# Patient Record
Sex: Male | Born: 1940 | Race: White | Hispanic: No | Smoking: Former smoker
Health system: Southern US, Community
[De-identification: ages and names within clinical notes are randomized; demographics above are authoritative.]

## PROBLEM LIST (undated history)

## (undated) DIAGNOSIS — I499 Cardiac arrhythmia, unspecified: Secondary | ICD-10-CM

## (undated) DIAGNOSIS — G8929 Other chronic pain: Secondary | ICD-10-CM

## (undated) DIAGNOSIS — R3911 Hesitancy of micturition: Secondary | ICD-10-CM

## (undated) DIAGNOSIS — Z860100 Personal history of colon polyps, unspecified: Secondary | ICD-10-CM

## (undated) DIAGNOSIS — R3915 Urgency of urination: Secondary | ICD-10-CM

## (undated) DIAGNOSIS — I10 Essential (primary) hypertension: Secondary | ICD-10-CM

## (undated) DIAGNOSIS — S058X9A Other injuries of unspecified eye and orbit, initial encounter: Secondary | ICD-10-CM

## (undated) DIAGNOSIS — G4700 Insomnia, unspecified: Secondary | ICD-10-CM

## (undated) DIAGNOSIS — M545 Low back pain, unspecified: Secondary | ICD-10-CM

## (undated) DIAGNOSIS — G2581 Restless legs syndrome: Secondary | ICD-10-CM

## (undated) DIAGNOSIS — K219 Gastro-esophageal reflux disease without esophagitis: Secondary | ICD-10-CM

## (undated) DIAGNOSIS — M1990 Unspecified osteoarthritis, unspecified site: Secondary | ICD-10-CM

## (undated) DIAGNOSIS — E785 Hyperlipidemia, unspecified: Secondary | ICD-10-CM

## (undated) DIAGNOSIS — I2510 Atherosclerotic heart disease of native coronary artery without angina pectoris: Secondary | ICD-10-CM

## (undated) DIAGNOSIS — N183 Chronic kidney disease, stage 3 (moderate): Secondary | ICD-10-CM

## (undated) DIAGNOSIS — C4490 Unspecified malignant neoplasm of skin, unspecified: Secondary | ICD-10-CM

## (undated) HISTORY — DX: Personal history of colon polyps, unspecified: Z86.0100

## (undated) HISTORY — PX: TONSILLECTOMY: SUR1361

## (undated) HISTORY — PX: BACK SURGERY: SHX140

## (undated) HISTORY — PX: FOREARM FRACTURE SURGERY: SHX649

## (undated) HISTORY — PX: COLONOSCOPY: SHX174

## (undated) HISTORY — PX: FRACTURE SURGERY: SHX138

## (undated) HISTORY — DX: Other injuries of unspecified eye and orbit, initial encounter: S05.8X9A

## (undated) HISTORY — DX: Gastro-esophageal reflux disease without esophagitis: K21.9

## (undated) HISTORY — DX: Hyperlipidemia, unspecified: E78.5

## (undated) HISTORY — DX: Personal history of colonic polyps: Z86.010

## (undated) HISTORY — DX: Cardiac arrhythmia, unspecified: I49.9

## (undated) HISTORY — PX: ORIF SHOULDER DISLOCATION W/ HUMERAL FRACTURE: SUR959

## (undated) HISTORY — PX: EYE SURGERY: SHX253

## (undated) HISTORY — PX: NASAL POLYP EXCISION: SHX2068

## (undated) HISTORY — DX: Essential (primary) hypertension: I10

## (undated) HISTORY — PX: COLONOSCOPY W/ BIOPSIES AND POLYPECTOMY: SHX1376

## (undated) HISTORY — DX: Chronic kidney disease, stage 3 (moderate): N18.3

## (undated) HISTORY — PX: DENTAL TRAUMA REPAIR (TOOTH REIMPLANTATION): SHX1450

## (undated) HISTORY — PX: PROSTATE BIOPSY: SHX241

## (undated) HISTORY — PX: WISDOM TOOTH EXTRACTION: SHX21

## (undated) HISTORY — PX: SHOULDER HARDWARE REMOVAL: SUR1131

## (undated) HISTORY — DX: Insomnia, unspecified: G47.00

## (undated) HISTORY — DX: Atherosclerotic heart disease of native coronary artery without angina pectoris: I25.10

---

## 1998-05-27 HISTORY — PX: REFRACTIVE SURGERY: SHX103

## 1999-04-23 ENCOUNTER — Ambulatory Visit (HOSPITAL_COMMUNITY): Admission: RE | Admit: 1999-04-23 | Discharge: 1999-04-23 | Payer: Self-pay | Admitting: Gastroenterology

## 1999-04-23 HISTORY — PX: PANENDOSCOPY: SHX2159

## 1999-05-23 ENCOUNTER — Ambulatory Visit (HOSPITAL_COMMUNITY): Admission: RE | Admit: 1999-05-23 | Discharge: 1999-05-23 | Payer: Self-pay | Admitting: Gastroenterology

## 2001-07-27 ENCOUNTER — Encounter: Payer: Self-pay | Admitting: Internal Medicine

## 2004-09-10 ENCOUNTER — Ambulatory Visit: Payer: Self-pay | Admitting: Internal Medicine

## 2004-09-17 ENCOUNTER — Ambulatory Visit: Payer: Self-pay | Admitting: Internal Medicine

## 2004-10-08 ENCOUNTER — Ambulatory Visit (HOSPITAL_BASED_OUTPATIENT_CLINIC_OR_DEPARTMENT_OTHER): Admission: RE | Admit: 2004-10-08 | Discharge: 2004-10-08 | Payer: Self-pay | Admitting: Internal Medicine

## 2004-10-24 ENCOUNTER — Ambulatory Visit: Payer: Self-pay | Admitting: Pulmonary Disease

## 2004-11-16 ENCOUNTER — Ambulatory Visit: Payer: Self-pay | Admitting: Internal Medicine

## 2004-11-26 ENCOUNTER — Ambulatory Visit: Payer: Self-pay | Admitting: Internal Medicine

## 2004-12-28 ENCOUNTER — Ambulatory Visit: Payer: Self-pay | Admitting: Internal Medicine

## 2005-01-14 ENCOUNTER — Ambulatory Visit: Payer: Self-pay | Admitting: Internal Medicine

## 2005-02-26 ENCOUNTER — Ambulatory Visit: Payer: Self-pay | Admitting: Internal Medicine

## 2005-03-05 ENCOUNTER — Ambulatory Visit: Payer: Self-pay | Admitting: Internal Medicine

## 2005-05-21 ENCOUNTER — Ambulatory Visit: Payer: Self-pay | Admitting: Internal Medicine

## 2005-06-06 ENCOUNTER — Ambulatory Visit: Payer: Self-pay | Admitting: Internal Medicine

## 2005-07-18 ENCOUNTER — Ambulatory Visit: Payer: Self-pay | Admitting: Internal Medicine

## 2005-08-26 ENCOUNTER — Ambulatory Visit: Payer: Self-pay | Admitting: Internal Medicine

## 2005-09-02 ENCOUNTER — Ambulatory Visit: Payer: Self-pay | Admitting: Internal Medicine

## 2006-02-05 ENCOUNTER — Ambulatory Visit: Payer: Self-pay | Admitting: Internal Medicine

## 2006-02-27 ENCOUNTER — Ambulatory Visit: Payer: Self-pay | Admitting: Internal Medicine

## 2006-03-04 ENCOUNTER — Ambulatory Visit: Payer: Self-pay | Admitting: Internal Medicine

## 2006-06-10 ENCOUNTER — Ambulatory Visit: Payer: Self-pay | Admitting: Internal Medicine

## 2006-09-04 ENCOUNTER — Ambulatory Visit: Payer: Self-pay | Admitting: Internal Medicine

## 2006-09-04 LAB — CONVERTED CEMR LAB
AST: 24 units/L (ref 0–37)
Albumin: 3.8 g/dL (ref 3.5–5.2)
Basophils Absolute: 0 10*3/uL (ref 0.0–0.1)
Bilirubin, Direct: 0.1 mg/dL (ref 0.0–0.3)
Chloride: 110 meq/L (ref 96–112)
Cholesterol: 170 mg/dL (ref 0–200)
Creatinine, Ser: 1.1 mg/dL (ref 0.4–1.5)
Eosinophils Relative: 3.6 % (ref 0.0–5.0)
Glucose, Bld: 102 mg/dL — ABNORMAL HIGH (ref 70–99)
HCT: 40.3 % (ref 39.0–52.0)
Hemoglobin: 14.5 g/dL (ref 13.0–17.0)
LDL Cholesterol: 101 mg/dL — ABNORMAL HIGH (ref 0–99)
MCHC: 35.9 g/dL (ref 30.0–36.0)
MCV: 91.2 fL (ref 78.0–100.0)
Monocytes Absolute: 0.6 10*3/uL (ref 0.2–0.7)
Neutrophils Relative %: 50.9 % (ref 43.0–77.0)
PSA: 2.92 ng/mL (ref 0.10–4.00)
Potassium: 4 meq/L (ref 3.5–5.1)
RDW: 13 % (ref 11.5–14.6)
Sodium: 146 meq/L — ABNORMAL HIGH (ref 135–145)
TSH: 2.41 microintl units/mL (ref 0.35–5.50)
Total Bilirubin: 0.9 mg/dL (ref 0.3–1.2)
Total Protein: 6.7 g/dL (ref 6.0–8.3)
WBC: 5.8 10*3/uL (ref 4.5–10.5)

## 2006-09-11 ENCOUNTER — Ambulatory Visit: Payer: Self-pay | Admitting: Internal Medicine

## 2006-11-20 DIAGNOSIS — R51 Headache: Secondary | ICD-10-CM | POA: Insufficient documentation

## 2006-11-20 DIAGNOSIS — K219 Gastro-esophageal reflux disease without esophagitis: Secondary | ICD-10-CM | POA: Insufficient documentation

## 2006-11-20 DIAGNOSIS — R519 Headache, unspecified: Secondary | ICD-10-CM | POA: Insufficient documentation

## 2006-11-20 DIAGNOSIS — E785 Hyperlipidemia, unspecified: Secondary | ICD-10-CM | POA: Insufficient documentation

## 2006-12-17 ENCOUNTER — Ambulatory Visit: Payer: Self-pay | Admitting: Internal Medicine

## 2006-12-17 DIAGNOSIS — I10 Essential (primary) hypertension: Secondary | ICD-10-CM | POA: Insufficient documentation

## 2006-12-17 DIAGNOSIS — K620 Anal polyp: Secondary | ICD-10-CM | POA: Insufficient documentation

## 2006-12-17 DIAGNOSIS — K621 Rectal polyp: Secondary | ICD-10-CM

## 2007-03-10 ENCOUNTER — Ambulatory Visit: Payer: Self-pay | Admitting: Internal Medicine

## 2007-03-10 LAB — CONVERTED CEMR LAB: VLDL: 19 mg/dL (ref 0–40)

## 2007-03-17 ENCOUNTER — Ambulatory Visit: Payer: Self-pay | Admitting: Internal Medicine

## 2007-03-17 LAB — CONVERTED CEMR LAB
Cholesterol, target level: 200 mg/dL
HDL goal, serum: 40 mg/dL
LDL Goal: 100 mg/dL

## 2007-07-28 ENCOUNTER — Telehealth: Payer: Self-pay | Admitting: Internal Medicine

## 2007-08-27 ENCOUNTER — Ambulatory Visit: Payer: Self-pay | Admitting: Internal Medicine

## 2007-08-27 ENCOUNTER — Telehealth: Payer: Self-pay | Admitting: Internal Medicine

## 2007-09-16 ENCOUNTER — Ambulatory Visit: Payer: Self-pay | Admitting: Internal Medicine

## 2007-09-16 LAB — CONVERTED CEMR LAB
ALT: 26 units/L (ref 0–53)
AST: 23 units/L (ref 0–37)
Albumin: 3.8 g/dL (ref 3.5–5.2)
BUN: 12 mg/dL (ref 6–23)
Basophils Absolute: 0 10*3/uL (ref 0.0–0.1)
Basophils Relative: 0.2 % (ref 0.0–1.0)
Bilirubin Urine: NEGATIVE
CO2: 28 meq/L (ref 19–32)
Calcium: 9 mg/dL (ref 8.4–10.5)
Chloride: 107 meq/L (ref 96–112)
Cholesterol: 153 mg/dL (ref 0–200)
Creatinine, Ser: 1.3 mg/dL (ref 0.4–1.5)
Eosinophils Absolute: 0.1 10*3/uL (ref 0.0–0.7)
Eosinophils Relative: 3 % (ref 0.0–5.0)
Glucose, Urine, Semiquant: NEGATIVE
Hemoglobin: 15 g/dL (ref 13.0–17.0)
Lymphocytes Relative: 32 % (ref 12.0–46.0)
MCHC: 34.9 g/dL (ref 30.0–36.0)
MCV: 92.6 fL (ref 78.0–100.0)
Neutro Abs: 2.7 10*3/uL (ref 1.4–7.7)
Neutrophils Relative %: 53.6 % (ref 43.0–77.0)
PSA: 3.42 ng/mL (ref 0.10–4.00)
RBC: 4.64 M/uL (ref 4.22–5.81)
Testosterone: 697.21 ng/dL (ref 350.00–890)
VLDL: 11 mg/dL (ref 0–40)
WBC Urine, dipstick: NEGATIVE
WBC: 4.9 10*3/uL (ref 4.5–10.5)
pH: 6.5

## 2007-09-23 ENCOUNTER — Ambulatory Visit: Payer: Self-pay | Admitting: Internal Medicine

## 2007-09-23 DIAGNOSIS — F528 Other sexual dysfunction not due to a substance or known physiological condition: Secondary | ICD-10-CM | POA: Insufficient documentation

## 2007-09-23 DIAGNOSIS — Z860100 Personal history of colon polyps, unspecified: Secondary | ICD-10-CM | POA: Insufficient documentation

## 2007-09-28 DIAGNOSIS — T148 Other injury of unspecified body region: Secondary | ICD-10-CM | POA: Insufficient documentation

## 2007-09-28 DIAGNOSIS — W57XXXA Bitten or stung by nonvenomous insect and other nonvenomous arthropods, initial encounter: Secondary | ICD-10-CM

## 2007-10-05 ENCOUNTER — Encounter: Payer: Self-pay | Admitting: Internal Medicine

## 2007-11-11 ENCOUNTER — Telehealth: Payer: Self-pay | Admitting: Internal Medicine

## 2008-03-30 ENCOUNTER — Ambulatory Visit: Payer: Self-pay | Admitting: Internal Medicine

## 2008-04-05 ENCOUNTER — Ambulatory Visit (HOSPITAL_BASED_OUTPATIENT_CLINIC_OR_DEPARTMENT_OTHER): Admission: RE | Admit: 2008-04-05 | Discharge: 2008-04-06 | Payer: Self-pay | Admitting: Urology

## 2008-04-05 HISTORY — PX: PENILE PROSTHESIS IMPLANT: SHX240

## 2008-06-01 ENCOUNTER — Encounter: Payer: Self-pay | Admitting: *Deleted

## 2008-09-21 ENCOUNTER — Ambulatory Visit: Payer: Self-pay | Admitting: Internal Medicine

## 2008-09-21 LAB — CONVERTED CEMR LAB
ALT: 22 units/L (ref 0–53)
AST: 20 units/L (ref 0–37)
BUN: 15 mg/dL (ref 6–23)
Basophils Relative: 0.5 % (ref 0.0–3.0)
Bilirubin, Direct: 0 mg/dL (ref 0.0–0.3)
Cholesterol: 187 mg/dL (ref 0–200)
Eosinophils Relative: 3.7 % (ref 0.0–5.0)
GFR calc non Af Amer: 58.54 mL/min (ref >60)
HCT: 44 % (ref 39.0–52.0)
LDL Cholesterol: 116 mg/dL — ABNORMAL HIGH (ref 0–99)
Lymphs Abs: 2 10*3/uL (ref 0.7–4.0)
Monocytes Relative: 10.7 % (ref 3.0–12.0)
Nitrite: NEGATIVE
Platelets: 167 10*3/uL (ref 150.0–400.0)
Potassium: 4.7 meq/L (ref 3.5–5.1)
RBC: 4.93 M/uL (ref 4.22–5.81)
Sodium: 144 meq/L (ref 135–145)
TSH: 2.09 microintl units/mL (ref 0.35–5.50)
Total Bilirubin: 1.2 mg/dL (ref 0.3–1.2)
Total CHOL/HDL Ratio: 4
Urobilinogen, UA: 0.2
VLDL: 20 mg/dL (ref 0.0–40.0)
WBC Urine, dipstick: NEGATIVE
WBC: 5.7 10*3/uL (ref 4.5–10.5)

## 2008-09-26 ENCOUNTER — Ambulatory Visit: Payer: Self-pay | Admitting: Internal Medicine

## 2008-09-26 ENCOUNTER — Telehealth: Payer: Self-pay | Admitting: Internal Medicine

## 2008-10-03 ENCOUNTER — Encounter: Payer: Self-pay | Admitting: Internal Medicine

## 2008-10-19 ENCOUNTER — Telehealth: Payer: Self-pay | Admitting: Internal Medicine

## 2009-02-23 ENCOUNTER — Telehealth: Payer: Self-pay | Admitting: Internal Medicine

## 2009-02-24 ENCOUNTER — Encounter: Payer: Self-pay | Admitting: Internal Medicine

## 2009-03-13 ENCOUNTER — Ambulatory Visit: Payer: Self-pay | Admitting: Internal Medicine

## 2009-03-13 LAB — CONVERTED CEMR LAB
HDL: 54.8 mg/dL (ref >39.00)
Total Bilirubin: 0.9 mg/dL (ref 0.3–1.2)
Total CHOL/HDL Ratio: 3
VLDL: 11.4 mg/dL (ref 0.0–40.0)

## 2009-03-20 ENCOUNTER — Ambulatory Visit: Payer: Self-pay | Admitting: Internal Medicine

## 2009-03-22 ENCOUNTER — Encounter: Payer: Self-pay | Admitting: Internal Medicine

## 2009-03-27 ENCOUNTER — Telehealth: Payer: Self-pay | Admitting: Internal Medicine

## 2009-03-30 ENCOUNTER — Ambulatory Visit: Payer: Self-pay | Admitting: Internal Medicine

## 2009-03-30 LAB — CONVERTED CEMR LAB

## 2009-04-03 ENCOUNTER — Telehealth: Payer: Self-pay | Admitting: Internal Medicine

## 2009-04-03 LAB — CONVERTED CEMR LAB
Chlamydia, DNA Probe: NEGATIVE
Hep B S Ab: POSITIVE — AB

## 2009-04-04 ENCOUNTER — Ambulatory Visit: Payer: Self-pay | Admitting: Internal Medicine

## 2009-04-04 DIAGNOSIS — B009 Herpesviral infection, unspecified: Secondary | ICD-10-CM

## 2009-05-17 ENCOUNTER — Telehealth: Payer: Self-pay | Admitting: Internal Medicine

## 2009-06-01 ENCOUNTER — Telehealth: Payer: Self-pay | Admitting: Internal Medicine

## 2009-09-27 ENCOUNTER — Ambulatory Visit: Payer: Self-pay | Admitting: Internal Medicine

## 2009-09-27 LAB — CONVERTED CEMR LAB
ALT: 27 units/L (ref 0–53)
AST: 23 units/L (ref 0–37)
Alkaline Phosphatase: 83 units/L (ref 39–117)
Basophils Relative: 0.5 % (ref 0.0–3.0)
Bilirubin, Direct: 0.1 mg/dL (ref 0.0–0.3)
Chlamydia, Swab/Urine, PCR: NEGATIVE
Chloride: 109 meq/L (ref 96–112)
Creatinine, Ser: 1.4 mg/dL (ref 0.4–1.5)
Eosinophils Relative: 4 % (ref 0.0–5.0)
HCV Ab: NEGATIVE
Hep B S Ab: POSITIVE — AB
LDL Cholesterol: 117 mg/dL — ABNORMAL HIGH (ref 0–99)
MCV: 94 fL (ref 78.0–100.0)
Monocytes Absolute: 0.6 10*3/uL (ref 0.1–1.0)
Neutrophils Relative %: 53.6 % (ref 43.0–77.0)
Nitrite: NEGATIVE
PSA: 4.7 ng/mL — ABNORMAL HIGH (ref 0.10–4.00)
Potassium: 4 meq/L (ref 3.5–5.1)
RBC: 4.72 M/uL (ref 4.22–5.81)
Total CHOL/HDL Ratio: 3
Total Protein: 6.5 g/dL (ref 6.0–8.3)
Urobilinogen, UA: 0.2
WBC: 6.1 10*3/uL (ref 4.5–10.5)

## 2009-10-02 ENCOUNTER — Ambulatory Visit: Payer: Self-pay | Admitting: Internal Medicine

## 2009-10-02 DIAGNOSIS — R972 Elevated prostate specific antigen [PSA]: Secondary | ICD-10-CM | POA: Insufficient documentation

## 2009-10-09 DIAGNOSIS — G4700 Insomnia, unspecified: Secondary | ICD-10-CM | POA: Insufficient documentation

## 2009-10-12 ENCOUNTER — Encounter: Payer: Self-pay | Admitting: Internal Medicine

## 2009-11-09 ENCOUNTER — Encounter: Payer: Self-pay | Admitting: Internal Medicine

## 2009-11-20 ENCOUNTER — Ambulatory Visit: Payer: Self-pay | Admitting: Internal Medicine

## 2009-11-24 ENCOUNTER — Encounter: Payer: Self-pay | Admitting: Internal Medicine

## 2009-12-04 ENCOUNTER — Ambulatory Visit: Payer: Self-pay | Admitting: Internal Medicine

## 2009-12-08 ENCOUNTER — Telehealth: Payer: Self-pay | Admitting: Internal Medicine

## 2010-01-09 ENCOUNTER — Telehealth: Payer: Self-pay | Admitting: Internal Medicine

## 2010-02-28 ENCOUNTER — Telehealth: Payer: Self-pay | Admitting: Internal Medicine

## 2010-03-05 ENCOUNTER — Ambulatory Visit: Payer: Self-pay | Admitting: Internal Medicine

## 2010-03-05 LAB — CONVERTED CEMR LAB
Chlamydia, Swab/Urine, PCR: NEGATIVE
PSA, Free Pct: 19 — ABNORMAL LOW (ref >25)
PSA, Free: 1.3 ng/mL
PSA: 6.81 ng/mL — ABNORMAL HIGH (ref 0.10–4.00)
Testosterone: 1014.59 ng/dL — ABNORMAL HIGH (ref 350.00–890.00)

## 2010-03-12 ENCOUNTER — Ambulatory Visit: Payer: Self-pay | Admitting: Internal Medicine

## 2010-03-12 DIAGNOSIS — E291 Testicular hypofunction: Secondary | ICD-10-CM | POA: Insufficient documentation

## 2010-04-18 ENCOUNTER — Telehealth: Payer: Self-pay | Admitting: Internal Medicine

## 2010-05-03 ENCOUNTER — Ambulatory Visit: Payer: Self-pay | Admitting: Internal Medicine

## 2010-05-14 ENCOUNTER — Ambulatory Visit: Payer: Self-pay | Admitting: Internal Medicine

## 2010-05-14 DIAGNOSIS — N411 Chronic prostatitis: Secondary | ICD-10-CM | POA: Insufficient documentation

## 2010-06-26 NOTE — Progress Notes (Signed)
Summary: lab to Dr. Logan Bores  Phone Note Call from Patient Call back at [phone]   Summary of Call: Want to make sure you sent lab of about a month ago to Dr. Marcelyn Bruins, [facility], Urology Dept.  You should have fax & info.   Initial call taken by: Rudy Jew, RN,  January 09, 2010 9:06 AM  Follow-up for Phone Call        done today Follow-up by: Willy Eddy, LPN,  January 09, 2010 9:11 AM

## 2010-06-26 NOTE — Assessment & Plan Note (Signed)
Summary: 3 month rov/njr   Vital Signs:  Patient profile:   70 year old male Height:      72 inches Weight:      218 pounds BMI:     29.67 Temp:     98.2 degrees F oral Pulse rate:   72 / minute Resp:     14 per minute BP sitting:   124 / 72  (left arm)  Vitals Entered By: Willy Eddy, LPN (March 12, 2010 10:59 AM)  Nutrition Counseling: Patient's BMI is greater than 25 and therefore counseled on weight management options. CC: roa labs, Hypertension Management Is Patient Diabetic? No   Primary Care Provider:  Stacie Glaze MD  CC:  roa labs and Hypertension Management.  History of Present Illness: folllow  up on requested STD testing ( all negative) monitering of HTN monitering of PSA  Hypertension History:      He denies headache, chest pain, palpitations, dyspnea with exertion, orthopnea, PND, peripheral edema, visual symptoms, neurologic problems, syncope, and side effects from treatment.        Positive major cardiovascular risk factors include male age 79 years old or older, hyperlipidemia, and hypertension.  Negative major cardiovascular risk factors include negative family history for ischemic heart disease and non-tobacco-user status.        Further assessment for target organ damage reveals no history of ASHD, stroke/TIA, or peripheral vascular disease.     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) 5)  Contact With or Exposure To Venereal Diseases  (ICD-V01.6) 6)  Colonic Polyps, Hx of  (ICD-V12.72) 7)  Erectile Dysfunction  (ICD-302.72) 8)  Preventive Health Care  (ICD-V70.0) 9)  Insect Bite  (ICD-919.4) 10)  Colonic  Polyps, Adenomatous, 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) 11)  Hypertension  (ICD-401.9) 12)  Hyperlipidemia  (ICD-272.4) 13)  Headache  (ICD-784.0) 14)  Gerd  (ICD-530.81)  Current Problems (verified): 1)  Insomnia  (ICD-780.52) 2)  Elevated Prostate Specific Antigen  (ICD-790.93) 3)  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 One By Mouth Daily 2)  Zocor 40 Mg  Tabs (Simvastatin) .... One By Mouth Daily 3)  Requip 1 Mg  Tabs (Ropinirole Hydrochloride) .... Take 1 Tablet By Mouth At Bedtime 4)  Diovan 80 Mg  Tabs (Valsartan) .... One By Mouth Daily 5)  Vesicare 10 Mg  Tabs (Solifenacin Succinate) .... Take 1 Tablet By Mouth Once A Day 6)  Zyrtec Allergy 10 Mg  Tabs (Cetirizine Hcl) .... Otc Prn 7)  Uroxatral 10 Mg  Tb24 (Alfuzosin Hcl) .... Once Daily 8)  Temazepam 30 Mg  Caps (Temazepam) .... One By Mouth Q Hs As Needed For Insomnia 9)  Omega-3 1000  Mg  Caps (Omega-3 Fatty Acids) .[name].. 1 Once Daily 10)  B-100 Complex  Tabs (Vitamins-Lipotropics) .[name].. 1 Once Daily 11)  Anacin 81 Mg  Tbec (Aspirin) .... Once Daily 12)  Multivitamins   Tabs (Multiple Vitamin) .... Once Daily 13)  Androgel 50 Mg/5gm  Gel (Testosterone) .... Two Pks To Skin Daily 14)  Valtrex 500 Mg Tabs (Valacyclovir Hcl) .... One By Mouth Three Times A Day  As Needed Out Break 15)  Vitamin E 400 Unit Caps (Vitamin E) .[name].. 1 Once Daily  Current Medications (verified): 1)  Protonix 40 Mg  Tbec (Pantoprazole Sodium) .... One By Mouth Daily 2)  Zocor 40 Mg  Tabs (Simvastatin) .... One By Mouth  Daily 3)  Requip 1 Mg  Tabs (Ropinirole Hydrochloride) .... Take 1 Tablet By Mouth At Bedtime 4)  Diovan 80 Mg  Tabs (Valsartan) .... One By Mouth Daily 5)  Vesicare 10 Mg  Tabs (Solifenacin Succinate) .... Take 1 Tablet By Mouth Once A Day 6)  Zyrtec Allergy 10 Mg  Tabs (Cetirizine Hcl) .... Otc Prn 7)  Uroxatral 10 Mg  Tb24 (Alfuzosin Hcl) .... Once Daily 8)  Temazepam 30 Mg  Caps (Temazepam) .... One By Mouth Q Hs As Needed For Insomnia 9)  Omega-3 1000 Mg  Caps (Omega-3 Fatty Acids) .[name].. 1 Once Daily 10)  B-100 Complex  Tabs (Vitamins-Lipotropics) .[name].. 1 Once Daily 11)  Anacin 81 Mg  Tbec (Aspirin) .... Once Daily 12)  Multivitamins   Tabs (Multiple Vitamin) .... Once Daily 13)  Androgel 50 Mg/5gm  Gel (Testosterone) .... One Pk Alternating With Two Packs Every Other Day 14)  Valtrex 500 Mg Tabs (Valacyclovir Hcl) .... One By Mouth Three Times A Day  As Needed Out Break 15)  Vitamin E 400 Unit Caps (Vitamin E) .[name].. 1 Once Daily 16)  Doxycycline Hyclate 100 Mg Caps (Doxycycline Hyclate) .... One By Mouth Two Times A Day For 30 Days  Allergies (verified): 1)  ! Pcn  Past History:  Family History: Last updated: 12/17/2006 father passed at age 45 of complications of age Pt had little contact with extended family Family History of Arthritis  Social History: Last updated: 11/20/2006 Retired Married Former Smoker Alcohol use-no Drug use-no  Risk Factors: Exercise: yes (03/17/2007)  Risk Factors: Smoking Status: quit (03/12/2010) Passive Smoke Exposure: no (03/12/2010)  Past medical, surgical, family and social histories (including risk factors) reviewed, and no changes noted (except as noted below).  Past Medical History: Reviewed history from 09/23/2007 and no changes required. GERD Headache Hyperlipidemia Insomnia MDE Hypertension corneal injury Colonic polyps, hx of  Past Surgical History: Reviewed history from 12/17/2006 and no changes  required. Panendoscopy-04/23/1999 Colonoscopy-10/03/2003 Lt are fracture Rt shoulder surgery Sinus surgery refractory surgery to eyes dental plate  Family History: Reviewed history from 12/17/2006 and no changes required. father passed at age 22 of complications of age Pt had little contact with extended family Family History of Arthritis  Social History: Reviewed history from 11/20/2006 and no changes required. Retired Married Former Smoker Alcohol use-no Drug use-no  Review of Systems  The patient denies anorexia, fever, weight loss, weight gain, vision loss, decreased hearing, hoarseness, chest pain, syncope, dyspnea on exertion, peripheral edema, prolonged cough, headaches, hemoptysis, abdominal pain, melena, hematochezia, severe indigestion/heartburn, hematuria, incontinence, genital sores, muscle weakness, suspicious skin lesions, transient blindness, difficulty walking, depression, unusual weight change, abnormal bleeding, enlarged lymph nodes, angioedema, and breast masses.    Physical Exam  General:  Well-developed,well-nourished,in no acute distress; alert,appropriate  and cooperative throughout examination Eyes:  pupils equal and pupils round.   Ears:  R ear normal and L ear normal.   Nose:  no external deformity and no nasal discharge.   Mouth:  Oral mucosa and oropharynx without lesions or exudates.  Teeth in good repair. Neck:  No deformities, masses, or tenderness noted. Lungs:  Normal respiratory effort, chest expands symmetrically. Lungs are clear to auscultation, no crackles or wheezes. Heart:  Normal rate and regular rhythm. S1 and S2 normal without gallop, murmur, click, rub or other extra sounds. Abdomen:  soft, normal bowel sounds, and no distention.   mild tendernedd Msk:  No deformity or scoliosis noted of thoracic or lumbar spine.   Extremities:  No clubbing, cyanosis, edema, or deformity noted with normal full range of motion of all joints.    Neurologic:  No cranial nerve deficits noted. Station and gait are normal. Plantar reflexes are down-going bilaterally. DTRs are symmetrical throughout. Sensory, motor and coordinative functions appear intact.   Impression & Recommendations:  Problem # 1:  TESTICULAR HYPOFUNCTION (ICD-257.2) slight elevation but pt aplied the androgel in the AM and has the test that AM  Problem # 2:  CONTACT WITH OR EXPOSURE TO VENEREAL DISEASES (ICD-V01.6) all  tests negative councilling about exposure  Problem # 3:  HYPERTENSION (ICD-401.9)  His updated medication list for this problem includes:    Diovan 80 Mg Tabs (Valsartan) ..... 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 One by mouth daily 2)  Zocor 40 Mg Tabs (Simvastatin) .... One by mouth daily 3)  Requip 1 Mg Tabs (Ropinirole hydrochloride) .... Take 1 tablet by mouth at bedtime 4)  Diovan 80 Mg Tabs (Valsartan) .... One by mouth daily 5)  Vesicare 10 Mg Tabs (Solifenacin succinate) .... Take 1 tablet by mouth once a day 6)  Zyrtec Allergy 10 Mg Tabs (Cetirizine hcl) .... Otc prn 7)  Uroxatral 10 Mg Tb24 (Alfuzosin hcl) .... Once daily 8)  Temazepam 30 Mg Caps (Temazepam) .... One by mouth q hs as needed for insomnia 9)  Omega-3 1000 Mg Caps (Omega-3 fatty acids) .[name].. 1 once daily 10)  B-100 Complex Tabs (Vitamins-lipotropics) .[name].. 1 once daily 11)  Anacin 81 Mg Tbec (Aspirin) .... Once daily 12)  Multivitamins Tabs (Multiple  vitamin) .... Once daily 13)  Androgel 50 Mg/5gm Gel (Testosterone) .... One pk alternating with two packs every other day 14)  Valtrex 500 Mg Tabs (Valacyclovir hcl) .... One by mouth three times a day  as needed out break 15)  Vitamin E 400 Unit Caps (Vitamin e) .[name].. 1 once daily 16)  Doxycycline Hyclate 100 Mg Caps (Doxycycline hyclate) .... One by mouth two times a day for 30 days  Hypertension Assessment/Plan:      The patient's hypertensive risk group is category B: At least one risk factor (excluding diabetes) with no target organ damage.  His calculated 10 year risk of coronary heart disease is  14 %.  Today's blood pressure is 124/72.  His blood pressure goal is < 140/90.  Patient Instructions: 1)  Please schedule a follow-up appointment in 2 months. 2)  PSA prior to visit, ICD-9:790.93 Prescriptions: DOXYCYCLINE HYCLATE 100 MG CAPS (DOXYCYCLINE HYCLATE) one by mouth two times a day for 30 days  #60 x 0   Entered and Authorized by:   Stacie Glaze MD   Signed by:   Stacie Glaze MD on 03/12/2010   Method used:   Electronically to        [facility]. [phone]* (retail)       [phone]       [place], [place]  [address]       Ph: [phone]       Fax: [phone]   RxID:   [id number]    Orders Added: 1)  Est. Patient Level IV [02725]   Immunization History:  Influenza Immunization History:    Influenza:  historical (01/26/2010)   Immunization History:  Influenza Immunization History:    Influenza:  Historical (01/26/2010)

## 2010-06-26 NOTE — Medication Information (Signed)
Summary: Coverage Approval for Restoril  Coverage Approval for Restoril   Imported By: Maryln Gottron 10/16/2009 12:18:14  _____________________________________________________________________  External Attachment:    Type:   Image     Comment:   External Document

## 2010-06-26 NOTE — Progress Notes (Signed)
Summary: PSA results  Phone Note Call from Patient   Caller: Patient Call For: Alex Glaze MD Summary of Call: Pt is calling for PSA results.  [phone] Initial call taken by: Lynann Beaver CMA,  December 08, 2009 8:37 AM  Follow-up for Phone Call        good new dropped from 6 to 3.9  suggesting that the bump was indeed infection Follow-up by: Alex Glaze MD,  December 08, 2009 11:06 AM  Additional Follow-up for Phone Call Additional follow up Details #1::        Pt. notified. Additional Follow-up by: Lynann Beaver CMA,  December 08, 2009 11:08 AM

## 2010-06-26 NOTE — Assessment & Plan Note (Signed)
Summary: CPX (PT WILL COME IN FOR FASTING LABS) / RS   Vital Signs:  Patient profile:   70 year old male Height:      72 inches Weight:      212 pounds BMI:     28.86 Temp:     98.3 degrees F oral Pulse rate:   72 / minute Resp:     14 per minute BP sitting:   120 / 78  (left arm)  Vitals Entered By: Willy Eddy, LPN (Oct 03, 8411 10:41 AM) CC: cpx--bcbs needs change of diovan to  generic med   CC:  cpx--bcbs needs change of diovan to  generic med.  History of Present Illness: elevations of PAS noted and treatment for possible prostate infections discussed with aln to moniter the psa and refer if the psa continues to rise the pts had screening of STD and we discussed this in length  The pt was asked about all immunizations, health maint. 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) 8)  Hypertension  (ICD-401.9) 9)  Hyperlipidemia  (ICD-272.4) 10)  Headache  (ICD-784.0) 11)  Gerd  (ICD-530.81)  Current Problems (verified): 1)  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 One By Mouth Daily 2)  Zocor 40 Mg  Tabs (Simvastatin) .... One By Mouth Daily 3)  Requip 1 Mg  Tabs (Ropinirole Hydrochloride) .... Take 1 Tablet By Mouth At Bedtime 4)  Diovan 80 Mg  Tabs (Valsartan) .... One By Mouth Daily 5)  Vesicare 10 Mg  Tabs (Solifenacin Succinate) .... Take 1 Tablet By Mouth Once A Day 6)  Zyrtec Allergy 10 Mg  Tabs (Cetirizine Hcl) .... Otc Prn 7)  Uroxatral 10 Mg  Tb24 (Alfuzosin Hcl) .... Once Daily 8)  Temazepam 30 Mg  Caps (Temazepam) .... One By Mouth Q Hs As Needed For Insomnia 9)  Omega-3 1000 Mg  Caps (Omega-3 Fatty Acids) .[name].. 1 Once Daily 10)  B-100 Complex  Tabs (Vitamins-Lipotropics) .[name].. 1 Once Daily 11)  Anacin 81 Mg  Tbec (Aspirin) .... Once Daily 12)  Multivitamins   Tabs (Multiple Vitamin) .... Once Daily 13)  Androgel 50 Mg/5gm  Gel (Testosterone) .... Two Pks To Skin Daily 14)  Valtrex 500 Mg Tabs (Valacyclovir Hcl) .... One By Mouth Three Times A Day  As Needed Out Break  Current Medications (verified): 1)  Protonix 40 Mg  Tbec (Pantoprazole Sodium) .... One By Mouth Daily 2)  Zocor 40 Mg  Tabs (Simvastatin) .... One By Mouth Daily 3)  Requip 1 Mg  Tabs (Ropinirole Hydrochloride) .... Take 1 Tablet By Mouth At Bedtime 4)  Diovan 80 Mg  Tabs (Valsartan) .... One By Mouth Daily 5)  Vesicare 10 Mg  Tabs (Solifenacin Succinate) .... Take 1 Tablet By Mouth Once A Day 6)  Zyrtec Allergy 10 Mg  Tabs (Cetirizine Hcl) .... Otc Prn 7)  Uroxatral 10 Mg  Tb24 (Alfuzosin Hcl) .... Once Daily 8)  Temazepam 30 Mg  Caps (Temazepam) .... One By Mouth Q Hs As Needed For Insomnia 9)  Omega-3 1000 Mg  Caps (Omega-3 Fatty Acids) .[name].. 1 Once Daily 10)  B-100 Complex  Tabs (Vitamins-Lipotropics) .[name].. 1 Once Daily 11)  Anacin 81 Mg  Tbec (Aspirin) .... Once Daily 12)  Multivitamins   Tabs (Multiple Vitamin) .... Once Daily 13)  Androgel 50 Mg/5gm  Gel (Testosterone) .... Two Pks To Skin  Daily 14)  Valtrex 500 Mg Tabs (Valacyclovir Hcl) .... One By Mouth Three Times A Day  As Needed Out Break 15)  Vitamin E 400 Unit Caps (Vitamin E) .[name].. 1 Once Daily 16)  Ciprofloxacin Hcl 500 Mg Tabs (Ciprofloxacin Hcl) .... One Pp Two Times A Day For 14 Days  Allergies (verified): 1)  ! Pcn  Past History:  Family History: Last updated: 12/17/2006 father passed at age 73 of complications of age Pt had little contact with extended family Family History of Arthritis  Social History: Last updated: 11/20/2006 Retired Married Former Smoker Alcohol use-no Drug use-no  Risk Factors: Exercise: yes (03/17/2007)  Risk Factors: Smoking Status: quit (10/02/2009) Passive Smoke Exposure: no (10/02/2009)  Past medical, surgical, family and social histories (including risk factors) reviewed, and no changes noted (except as noted below).  Past Medical History: Reviewed history from 09/23/2007 and no changes required. GERD Headache Hyperlipidemia Insomnia MDE Hypertension corneal injury Colonic polyps, hx of  Past Surgical History: Reviewed history from 12/17/2006 and no changes required. Panendoscopy-04/23/1999 Colonoscopy-10/03/2003 Lt are fracture Rt shoulder surgery Sinus surgery refractory surgery to eyes dental plate  Family History: Reviewed history from 12/17/2006 and no changes required. father passed at age 75 of complications of age Pt had little contact with extended family Family History of Arthritis  Social History: Reviewed history from 11/20/2006 and no changes required. Retired Married Former Smoker Alcohol use-no Drug use-no  Review of Systems  The patient denies anorexia, fever, weight loss, weight gain, vision loss, decreased hearing, hoarseness, chest pain, syncope, dyspnea on exertion, peripheral edema, prolonged cough, headaches, hemoptysis, abdominal pain, melena, hematochezia, severe indigestion/heartburn, hematuria, incontinence, genital  sores, muscle weakness, suspicious skin lesions, transient blindness, difficulty walking, depression, unusual weight change, abnormal bleeding, enlarged lymph nodes, angioedema, and breast masses.    Physical Exam  General:  Well-developed,well-nourished,in no acute distress; alert,appropriate and cooperative throughout examination Head:  normocephalic and male-pattern balding.   Eyes:  pupils equal and pupils round.   Ears:  R ear normal and L ear normal.   Nose:  no external deformity and no nasal discharge.   Neck:  No deformities, masses, or tenderness noted. Chest Wall:  No deformities, masses, tenderness or gynecomastia noted. Lungs:  Normal respiratory effort, chest expands symmetrically. Lungs are clear to auscultation, no crackles or wheezes. Heart:  Normal rate and regular rhythm. S1 and S2 normal without gallop, murmur, click, rub or other extra sounds. Abdomen:  soft, normal bowel sounds, and no distention.   mild tendernedd Msk:  No deformity or scoliosis noted of thoracic  or lumbar spine.   Pulses:  R and L carotid,radial,femoral,dorsalis pedis and posterior tibial pulses are full and equal bilaterally Extremities:  No clubbing, cyanosis, edema, or deformity noted with normal full range of motion of all joints.   Neurologic:  No cranial nerve deficits noted. Station and gait are normal. Plantar reflexes are down-going bilaterally. DTRs are symmetrical throughout. Sensory, motor and coordinative functions appear intact.   Impression & Recommendations:  Problem # 1:  PREVENTIVE HEALTH CARE (ICD-V70.0) The pt was asked about all immunizations, health maint. services that are appropriate to their age and was given guidance on diet exercize  and weight management  Colonoscopy: abnormal (10/08/2006) Td Booster: Historical (05/27/2006)   Flu Vax: Historical (03/30/2009)   Pneumovax: Historical (05/27/2006) Chol: 189 (09/27/2009)   HDL: 58.50 (09/27/2009)   LDL: 117 (09/27/2009)    TG: 69.0 (09/27/2009) TSH: 3.01 (09/27/2009)   PSA: 4.70 (09/27/2009) Next Colonoscopy due:: 09/2009 (09/26/2008)  Discussed using sunscreen, use of alcohol, drug use, self testicular exam, routine dental care, routine eye care, routine physical exam, seat belts, multiple vitamins, osteoporosis prevention, adequate calcium intake in diet, and recommendations for immunizations.  Discussed exercise and checking cholesterol.  Discussed gun safety, safe sex, and contraception. Also recommend checking PSA.  Problem # 2:  HYPERTENSION (ICD-401.9) stable His updated medication list for this problem includes:    Diovan 80 Mg Tabs (Valsartan) ..... One by mouth daily  BP today: 120/78 Prior BP: 130/76 (04/04/2009)  Prior 10 Yr Risk Heart Disease: 22 % (03/17/2007)  Labs Reviewed: K+: 4.0 (09/27/2009) Creat: : 1.4 (09/27/2009)   Chol: 189 (09/27/2009)   HDL: 58.50 (09/27/2009)   LDL: 117 (09/27/2009)   TG: 69.0 (09/27/2009)  Problem # 3:  GERD (ICD-530.81)  His updated medication list for this problem includes:    Protonix 40 Mg Tbec (Pantoprazole sodium) ..... One by mouth daily  Complete Medication List: 1)  Protonix 40 Mg Tbec (Pantoprazole sodium) .... One by mouth daily 2)  Zocor 40 Mg Tabs (Simvastatin) .... One by mouth daily 3)  Requip 1 Mg Tabs (Ropinirole hydrochloride) .... Take 1 tablet by mouth at bedtime 4)  Diovan 80 Mg Tabs (Valsartan) .... One by mouth daily 5)  Vesicare 10 Mg Tabs (Solifenacin succinate) .... Take 1 tablet by mouth once a day 6)  Zyrtec Allergy 10 Mg Tabs (Cetirizine hcl) .... Otc prn 7)  Uroxatral 10 Mg Tb24 (Alfuzosin hcl) .... Once daily 8)  Temazepam 30 Mg Caps (Temazepam) .... One by mouth q hs as needed for insomnia 9)  Omega-3 1000 Mg Caps (Omega-3 fatty acids) .[name].. 1 once daily 10)  B-100 Complex Tabs (Vitamins-lipotropics) .[name].. 1 once daily 11)  Anacin 81 Mg Tbec (Aspirin) .... Once daily 12)  Multivitamins Tabs (Multiple vitamin) .... Once  daily 13)  Androgel 50 Mg/5gm Gel (Testosterone) .... Two pks to skin daily 14)  Valtrex 500 Mg Tabs (Valacyclovir hcl) .... One by mouth three times a day  as needed out break 15)  Vitamin E 400 Unit Caps (Vitamin e) .[name].. 1 once daily 16)  Ciprofloxacin Hcl 500 Mg Tabs (Ciprofloxacin hcl) .... One pp two times a day for 14 days  Other Orders: Prescription Created Electronically [id number])  Patient Instructions: 1)  pt will call and set up the colon for hx of polyps ( recall date was 09/24/2009) 2)  Please schedule a follow-up appointment in 8 weeks. 3)  PSA prior to visit, ICD-9:601.1 Prescriptions: CIPROFLOXACIN HCL 500 MG TABS (CIPROFLOXACIN HCL) one  pp two times a day for 14 days  #28 x 0   Entered and Authorized by:   Stacie Glaze MD   Signed by:   Stacie Glaze MD on 10/02/2009   Method used:   Print then Give to Patient   RxID:   [id number] TEMAZEPAM 30 MG  CAPS (TEMAZEPAM) one by mouth q hs as needed for insomnia  #90 x 3   Entered and Authorized by:   Stacie Glaze MD   Signed by:   Stacie Glaze MD on 10/02/2009   Method used:   Print then Give to Patient   RxID:   [id number]    Immunization History:  Influenza Immunization History:    Influenza:  historical (03/30/2009)    Appended Document: CPX (PT WILL COME IN FOR FASTING LABS) / RS

## 2010-06-26 NOTE — Procedures (Signed)
Summary: Colonoscopy Report/[facility]  Colonoscopy Report/[facility]   Imported By: Maryln Gottron 11/14/2009 13:48:19  _____________________________________________________________________  External Attachment:    Type:   Image     Comment:   External Document

## 2010-06-26 NOTE — Progress Notes (Signed)
Summary: add labs to appt   Phone Note Call from Patient   Caller: Patient Call For: Stacie Glaze MD Summary of Call: Pt wants to add STD testing on his next lab appt. next week.  Also, wants Testosterone repeated. [phone] Initial call taken by: Lynann Beaver CMA,  February 28, 2010 9:39 AM  Follow-up for Phone Call        ok to add to l abs--same dx Follow-up by: Willy Eddy, LPN,  February 28, 2010 9:42 AM  Additional Follow-up for Phone Call Additional follow up Details #1::        labs  must be specify Additional Follow-up by: Heron Sabins,  February 28, 2010 9:50 AM    Additional Follow-up for Phone Call Additional follow up Details #2::    thanks for reminding me- he needs rpr, gc chlamidia urine, hiv and testosterone level Follow-up by: Willy Eddy, LPN,  February 28, 2010 10:00 AM  Additional Follow-up for Phone Call Additional follow up Details #3:: Details for Additional Follow-up Action Taken: lmom/ [facility] [phone] 03-01-10 pt is aware 03-02-2010 Additional Follow-up by: Heron Sabins,  February 28, 2010 11:01 AM

## 2010-06-26 NOTE — Letter (Signed)
Summary: [facility] Urology-Upcoming Appointment   [facility] Urology-Upcoming Appointment   Imported By: Maryln Gottron 11/24/2009 10:51:41  _____________________________________________________________________  External Attachment:    Type:   Image     Comment:   External Document

## 2010-06-26 NOTE — Progress Notes (Signed)
Summary: REFILL REQUEST  Phone Note Refill Request Message from:  Patient on April 18, 2010 10:09 AM  Refills Requested: Medication #1:  VESICARE 10 MG  TABS Take 1 tablet by mouth once a day   Notes: [facility]  Medication #2:  TEMAZEPAM 30 MG  CAPS one by mouth q hs as needed for insomnia   Notes: [facility]    Initial call taken by: Debbra Riding,  April 18, 2010 10:10 AM Caller: Patient   [phone]    Prescriptions: TEMAZEPAM 30 MG  CAPS (TEMAZEPAM) one by mouth q hs as needed for insomnia  #90 x 3   Entered by:   Willy Eddy, LPN   Authorized by:   Stacie Glaze MD   Signed by:   Willy Eddy, LPN on 28/41/3244   Method used:   Telephoned to ...       [facility]. [phone]* (retail)       [phone]       [place], [place]  [address]       Ph: [phone]       Fax: [phone]   RxID:   [id number] VESICARE 10 MG  TABS (SOLIFENACIN SUCCINATE) Take 1 tablet by mouth once a day  #90 x 3   Entered by:   Willy Eddy, LPN   Authorized by:   Stacie Glaze MD   Signed by:   Willy Eddy, LPN on 32/35/5732   Method used:   Telephoned to ...       [facility]. [phone]* (retail)       [phone] [address]       [place], [place]  [address]       Ph: [phone]       Fax: [phone]   RxID:   [id number]

## 2010-06-26 NOTE — Progress Notes (Signed)
Summary: REQ FOR REFILL  Phone Note Call from Patient Message from:  Patient on June 01, 2009 8:41 AM  Refills Requested: Medication #1:  Androgel 50mg /5gm gel   Dosage confirmed as above?Dosage Confirmed   Supply Requested: 3 months   Dosage confirmed as above?Dosage Confirmed  Method Requested: Electronic to [facility] Caller: Patient [phone] or [phone] Reason for Call: Refill Medication Summary of Call: Pt called to adv that he needs to have a refill RX for med (Androgel 50mg /5gm gel)..... Pt req that same be sent in to [facility]...Marland KitchenMarland KitchenPt req a call when same has been done, msg can be left on v/m @   [phone]  or  [phone].   Initial call taken by: Debbra Riding,  June 01, 2009 8:38 AM  Follow-up for Phone Call        done Follow-up by: Willy Eddy, LPN,  June 01, 2009 8:48 AM  Additional Follow-up for Phone Call Additional follow up Details #1::        Phone Call Completed---------Pt contacted and adv that RX has been sent to [facility]... Pt acknowledged same.  Additional Follow-up by: Debbra Riding,  June 01, 2009 10:07 AM    Prescriptions: ANDROGEL 50 MG/5GM  GEL (TESTOSTERONE) two pks to skin daily  #180 x 1   Entered by:   Willy Eddy, LPN   Authorized by:   Stacie Glaze MD   Signed by:   Willy Eddy, LPN on 29/52/8413   Method used:   Print then Give to Patient   RxID:   [id number]

## 2010-06-26 NOTE — Assessment & Plan Note (Signed)
Summary: 2 month rov/njr   Vital Signs:  Patient profile:   70 year old male Height:      72 inches Weight:      214 pounds BMI:     29.13 Temp:     98.2 degrees F oral Pulse rate:   72 / minute Resp:     14 per minute BP sitting:   140 / 80  (left arm)  Vitals Entered By: Willy Eddy, LPN (December 04, 2009 11:03 AM) CC: roa-labs- dr Logan Bores received psa and wants your opinion as to what to do?, Hypertension Management, Lipid Management   Primary Care Provider:  Stacie Glaze MD  CC:  roa-labs- dr Logan Bores received psa and wants your opinion as to what to do?, Hypertension Management, and Lipid Management.  History of Present Illness: The pt was seen in [facility]rology ( [facility]) the pts PSA continues to rise this could be due to the testosterone the urologist has suggested a PSA 2 and send to the urologist   Hypertension History:      He denies headache, chest pain, palpitations, dyspnea with exertion, orthopnea, PND, peripheral edema, visual symptoms, neurologic problems, syncope, and side effects from treatment.        Positive major cardiovascular risk factors include male age 64 years old or older, hyperlipidemia, and hypertension.  Negative major cardiovascular risk factors include negative family history for ischemic heart disease and non-tobacco-user status.        Further assessment for target organ damage reveals no history of ASHD, stroke/TIA, or peripheral vascular disease.    Lipid Management History:      Positive NCEP/ATP III risk factors include male age 59 years old or older and hypertension.  Negative NCEP/ATP III risk factors include no family history for ischemic heart disease, non-tobacco-user status, no ASHD (atherosclerotic heart disease), no prior stroke/TIA, no peripheral vascular disease, and no history of aortic aneurysm.      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) 10)  Hypertension  (ICD-401.9) 11)  Hyperlipidemia  (ICD-272.4) 12)  Headache  (ICD-784.0) 13)  Gerd  (ICD-530.81)  Current Problems (verified): 1)  Insomnia  (ICD-780.52) 2)  Elevated Prostate Specific Antigen  (ICD-790.93) 3)  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 One By Mouth Daily 2)  Zocor 40 Mg  Tabs (Simvastatin) .... One By Mouth Daily 3)  Requip 1 Mg  Tabs (Ropinirole Hydrochloride) .... Take 1 Tablet By Mouth At Bedtime 4)  Diovan 80 Mg  Tabs (Valsartan) .Marland KitchenMarland KitchenMarland Kitchen  One By Mouth Daily 5)  Vesicare 10 Mg  Tabs (Solifenacin Succinate) .... Take 1 Tablet By Mouth Once A Day 6)  Zyrtec Allergy 10 Mg  Tabs (Cetirizine Hcl) .... Otc Prn 7)  Uroxatral 10 Mg  Tb24 (Alfuzosin Hcl) .... Once Daily 8)  Temazepam 30 Mg  Caps (Temazepam) .... One By Mouth Q Hs As Needed For Insomnia 9)  Omega-3 1000 Mg  Caps (Omega-3 Fatty Acids) .[name].. 1 Once Daily 10)  B-100 Complex  Tabs (Vitamins-Lipotropics) .[name].. 1 Once Daily 11)  Anacin 81 Mg  Tbec (Aspirin) .... Once Daily 12)  Multivitamins   Tabs (Multiple Vitamin) .... Once Daily 13)   Androgel 50 Mg/5gm  Gel (Testosterone) .... Two Pks To Skin Daily 14)  Valtrex 500 Mg Tabs (Valacyclovir Hcl) .... One By Mouth Three Times A Day  As Needed Out Break 15)  Vitamin E 400 Unit Caps (Vitamin E) .[name].. 1 Once Daily 16)  Ciprofloxacin Hcl 500 Mg Tabs (Ciprofloxacin Hcl) .... One Pp Two Times A Day For 14 Days  Current Medications (verified): 1)  Protonix 40 Mg  Tbec (Pantoprazole Sodium) .... One By Mouth Daily 2)  Zocor 40 Mg  Tabs (Simvastatin) .... One By Mouth Daily 3)  Requip 1 Mg  Tabs (Ropinirole Hydrochloride) .... Take 1 Tablet By Mouth At Bedtime 4)  Diovan 80 Mg  Tabs (Valsartan) .... One By Mouth Daily 5)  Vesicare 10 Mg  Tabs (Solifenacin Succinate) .... Take 1 Tablet By Mouth Once A Day 6)  Zyrtec Allergy 10 Mg  Tabs (Cetirizine Hcl) .... Otc Prn 7)  Uroxatral 10 Mg  Tb24 (Alfuzosin Hcl) .... Once Daily 8)  Temazepam 30 Mg  Caps (Temazepam) .... One By Mouth Q Hs As Needed For Insomnia 9)  Omega-3 1000 Mg  Caps (Omega-3 Fatty Acids) .[name].. 1 Once Daily 10)  B-100 Complex  Tabs (Vitamins-Lipotropics) .[name].. 1 Once Daily 11)  Anacin 81 Mg  Tbec (Aspirin) .... Once Daily 12)  Multivitamins   Tabs (Multiple Vitamin) .... Once Daily 13)  Androgel 50 Mg/5gm  Gel (Testosterone) .... Two Pks To Skin Daily 14)  Valtrex 500 Mg Tabs (Valacyclovir Hcl) .... One By Mouth Three Times A Day  As Needed Out Break 15)  Vitamin E 400 Unit Caps (Vitamin E) .[name].. 1 Once Daily  Allergies (verified): 1)  ! Pcn  Past History:  Family History: Last updated: 12/17/2006 father passed at age 41 of complications of age Pt had little contact with extended family Family History of Arthritis  Social History: Last updated: 11/20/2006 Retired Married Former Smoker Alcohol use-no Drug use-no  Risk Factors: Exercise: yes (03/17/2007)  Risk Factors: Smoking Status: quit (12/04/2009) Passive Smoke Exposure: no (12/04/2009)  Past medical, surgical, family and social histories (including  risk factors) reviewed, and no changes noted (except as noted below).  Past Medical History: Reviewed history from 09/23/2007 and no changes required. GERD Headache Hyperlipidemia Insomnia MDE Hypertension corneal injury Colonic polyps, hx of  Past Surgical History: Reviewed history from 12/17/2006 and no changes required. Panendoscopy-04/23/1999 Colonoscopy-10/03/2003 Lt are fracture Rt shoulder surgery Sinus surgery refractory surgery to eyes dental plate  Family History: Reviewed history from 12/17/2006 and no changes required. father passed at age 59 of complications of age Pt had little contact with extended family Family History of Arthritis  Social History: Reviewed history from 11/20/2006 and no changes required. Retired Married Former Smoker Alcohol use-no Drug use-no  Review of Systems  The patient denies anorexia, fever, weight loss,  weight gain, vision loss, decreased hearing, hoarseness, chest pain, syncope, dyspnea on exertion, peripheral edema, prolonged cough, headaches, hemoptysis, abdominal pain, melena, hematochezia, severe indigestion/heartburn, hematuria, incontinence, genital sores, muscle weakness, suspicious skin lesions, transient blindness, difficulty walking, depression, unusual weight change, abnormal bleeding, enlarged lymph nodes, angioedema, and breast masses.    Physical Exam  General:  Well-developed,well-nourished,in no acute distress; alert,appropriate and cooperative throughout examination Head:  normocephalic and male-pattern balding.   Eyes:  pupils equal and pupils round.   Ears:  R ear normal and L ear normal.   Nose:  no external deformity and no nasal discharge.   Mouth:  Oral mucosa and oropharynx without lesions or exudates.  Teeth in good repair. Neck:  No deformities, masses, or tenderness noted. Lungs:  Normal respiratory effort, chest expands symmetrically. Lungs are clear to auscultation, no crackles or wheezes. Heart:   Normal rate and regular rhythm. S1 and S2 normal without gallop, murmur, click, rub or other extra sounds.   Impression & Recommendations:  Problem # 1:  ELEVATED PROSTATE SPECIFIC ANTIGEN (ICD-790.93) Assessment Deteriorated  trhe psa has persistantly risen to 6 need free psa and if less than 25% WOULD DISCUSS BIOPSY  Orders: Venipuncture (04540) T-PSA Free ([address]) T-PSA Total ([address])  Problem # 2:  ERECTILE DYSFUNCTION (ICD-302.72) Assessment: Unchanged  Discussed proper use of medications, as well as side effects.   Problem # 3:  HYPERTENSION (ICD-401.9)  His updated medication list for this problem includes:    Diovan 80 Mg Tabs (Valsartan) ..... One by mouth daily  BP today: 140/80 Prior BP: 120/78 (10/02/2009)  Prior 10 Yr Risk Heart Disease: 22 % (03/17/2007)  Labs Reviewed: K+: 4.0 (09/27/2009) Creat: : 1.4 (09/27/2009)   Chol: 189 (09/27/2009)   HDL: 58.50 (09/27/2009)   LDL: 117 (09/27/2009)   TG: 69.0 (09/27/2009)  Problem # 4:  GERD (ICD-530.81)  His updated medication list for this problem includes:    Protonix 40 Mg Tbec (Pantoprazole sodium) ..... One by mouth daily  Labs Reviewed: Hgb: 15.6 (09/27/2009)   Hct: 44.3 (09/27/2009)  Complete Medication List: 1)  Protonix 40 Mg Tbec (Pantoprazole sodium) .... One by mouth daily 2)  Zocor 40 Mg Tabs (Simvastatin) .... One by mouth daily 3)  Requip 1 Mg Tabs (Ropinirole hydrochloride) .... Take 1 tablet by mouth at bedtime 4)  Diovan 80 Mg Tabs (Valsartan) .... One by mouth daily 5)  Vesicare 10 Mg Tabs (Solifenacin succinate) .... Take 1 tablet by mouth once a day 6)  Zyrtec Allergy 10 Mg Tabs (Cetirizine hcl) .... Otc prn 7)  Uroxatral 10 Mg Tb24 (Alfuzosin hcl) .... Once daily 8)  Temazepam 30 Mg Caps (Temazepam) .... One by mouth q hs as needed for insomnia 9)  Omega-3 1000 Mg Caps (Omega-3 fatty acids) .[name].. 1 once daily 10)  B-100 Complex Tabs (Vitamins-lipotropics) .[name].. 1 once daily 11)   Anacin 81 Mg Tbec (Aspirin) .... Once daily 12)  Multivitamins Tabs (Multiple vitamin) .... Once daily 13)  Androgel 50 Mg/5gm Gel (Testosterone) .... Two pks to skin daily 14)  Valtrex 500 Mg Tabs (Valacyclovir hcl) .... One by mouth three times a day  as needed out break 15)  Vitamin E 400 Unit Caps (Vitamin e) .[name].. 1 once daily  Hypertension Assessment/Plan:      The patient's hypertensive risk group is category B: At least one risk factor (excluding diabetes) with no target organ damage.  His calculated 10 year risk of coronary heart disease is 22 %.  Today's blood pressure is 140/80.  His blood pressure goal is < 140/90.  Lipid Assessment/Plan:      Based on NCEP/ATP III, the patient's risk factor category is "2 or more risk factors and a calculated 10 year CAD risk of > 20%".  The patient's lipid goals are as follows: Total cholesterol goal is 200; LDL cholesterol goal is 100; HDL cholesterol goal is 40; Triglyceride goal is 150.  His LDL cholesterol goal has been met.    Patient Instructions: 1)  Please schedule a follow-up appointment in 3 months. 2)  FREE AND TOTAL PSA IN 3 MONTHS   Prescriptions: ANDROGEL 50 MG/5GM  GEL (TESTOSTERONE) two pks to skin daily  #180 x 1   Entered by:   Willy Eddy, LPN   Authorized by:   Stacie Glaze MD   Signed by:   Willy Eddy, LPN on 04/54/0981   Method used:   Print then Give to Patient   RxID:   [id number] PROTONIX 40 MG  TBEC (PANTOPRAZOLE SODIUM) one by mouth daily  #90 x 3   Entered by:   Willy Eddy, LPN   Authorized by:   Stacie Glaze MD   Signed by:   Willy Eddy, LPN on 57/84/6962   Method used:   Print then Give to Patient   RxID:   [id number]

## 2010-06-28 NOTE — Assessment & Plan Note (Signed)
Summary: 2 MO ROV/MM   Vital Signs:  Patient profile:   70 year old male Height:      72 inches Weight:      218 pounds BMI:     29.67 Temp:     98.2 degrees F oral Pulse rate:   72 / minute Resp:     14 per minute BP sitting:   144 / 80  (left arm)  Vitals Entered By: Willy Eddy, LPN (May 14, 2010 11:52 AM) CC: roa Is Patient Diabetic? No   Primary Care Provider:  Stacie Glaze MD  CC:  roa.  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) 5)  Contact With or Exposure To Venereal Diseases  (ICD-V01.6) 6)  Colonic Polyps, Hx of  (ICD-V12.72) 7)  Erectile Dysfunction  (ICD-302.72) 8)  Preventive Health Care  (ICD-V70.0) 9)  Insect Bite  (ICD-919.4) 10)  Colonic Polyps, Adenomatous, 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) 11)  Hypertension  (ICD-401.9) 12)  Hyperlipidemia  (ICD-272.4) 13)  Headache  (ICD-784.0) 14)  Gerd  (ICD-530.81)  Current Problems (verified): 1)  Testicular Hypofunction  (ICD-257.2) 2)  Insomnia  (ICD-780.52) 3)  Elevated Prostate Specific Antigen  (ICD-790.93) 4)  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 One By Mouth Daily 2)  Zocor 40 Mg  Tabs (Simvastatin) .... One By Mouth Daily 3)  Requip 1 Mg  Tabs (Ropinirole Hydrochloride) .... Take 1 Tablet By Mouth At Bedtime 4)  Diovan 80 Mg  Tabs (Valsartan) .... One By Mouth Daily 5)  Vesicare 10 Mg  Tabs (Solifenacin Succinate) .... Take 1 Tablet By Mouth Once A Day 6)  Zyrtec Allergy 10 Mg  Tabs (Cetirizine Hcl) .... Otc Prn 7)  Uroxatral 10 Mg  Tb24 (Alfuzosin Hcl) .... Once Daily 8)  Temazepam 30 Mg  Caps (Temazepam) .... One By Mouth Q Hs As Needed For Insomnia 9)  Omega-3 1000 Mg  Caps (Omega-3 Fatty Acids) .[name].. 1 Once Daily 10)  B-100 Complex  Tabs (Vitamins-Lipotropics) .[name].. 1 Once Daily 11)  Anacin 81 Mg  Tbec (Aspirin) .... Once Daily 12)  Multivitamins   Tabs (Multiple Vitamin) .... Once Daily 13)  Androgel 50 Mg/5gm  Gel (Testosterone) .... One Pk Alternating With Two Packs Every Other Day 14)  Valtrex 500 Mg Tabs (Valacyclovir Hcl) .... One By Mouth Three Times A Day  As Needed Out Break 15)  Vitamin E 400 Unit Caps (Vitamin E) .[name].. 1 Once Daily 16)  Doxycycline Hyclate 100 Mg Caps (Doxycycline Hyclate) .... One By Mouth Two Times A Day For 30 Days  Current Medications (verified): 1)  Protonix 40 Mg  Tbec (Pantoprazole Sodium) .... One By Mouth Daily 2)  Zocor 40 Mg  Tabs (Simvastatin) .... One By Mouth Daily 3)  Requip 1 Mg  Tabs (Ropinirole Hydrochloride) .... Take 1 Tablet By Mouth At Bedtime 4)  Diovan 80 Mg  Tabs (Valsartan) .... One By Mouth Daily 5)  Vesicare 10 Mg  Tabs (Solifenacin Succinate) .... Take 1 Tablet By Mouth Once A Day 6)  Zyrtec Allergy 10 Mg  Tabs (Cetirizine Hcl) .... Otc Prn 7)  Uroxatral 10 Mg  Tb24 (Alfuzosin Hcl) .... Once Daily 8)  Temazepam 30 Mg  Caps (Temazepam) .... One By Mouth Q Hs As Needed For Insomnia 9)  Omega-3 1000 Mg  Caps (Omega-3 Fatty Acids)  .[name].. 1 Once Daily 10)  B-100 Complex  Tabs (Vitamins-Lipotropics) .[name].. 1 Once Daily 11)  Anacin 81 Mg  Tbec (Aspirin) .... Once Daily 12)  Multivitamins   Tabs (Multiple Vitamin) .... Once Daily 13)  Androgel 50 Mg/5gm  Gel (Testosterone) .... One Pk Alternating With Two Packs Every Other Day 14)  Valtrex 500 Mg Tabs (Valacyclovir Hcl) .... One By Mouth Three Times A Day  As Needed Out Break 15)  Vitamin E 400 Unit Caps (Vitamin E) .[name].. 1 Once Daily 16)  Ciprofloxacin Hcl 250 Mg Tabs (Ciprofloxacin Hcl) .... One By Mouth Two Times A Day  Allergies (verified): 1)  ! Pcn  Past History:  Family History: Last updated: 12/17/2006 father passed at age 78 of complications of age Pt had little contact with extended family Family History of Arthritis  Social History: Last updated: 11/20/2006 Retired Married Former Smoker Alcohol use-no Drug use-no  Risk Factors: Exercise: yes (03/17/2007)  Risk Factors: Smoking Status: quit (05/14/2010) Passive Smoke Exposure: no (05/14/2010)  Past medical, surgical, family and social histories (including risk factors) reviewed, and no changes noted (except as noted below).  Past Medical History: Reviewed history from 09/23/2007 and no changes required. GERD Headache Hyperlipidemia Insomnia MDE Hypertension corneal injury Colonic polyps, hx of  Past Surgical History: Reviewed history from 12/17/2006 and no changes required. Panendoscopy-04/23/1999 Colonoscopy-10/03/2003 Lt are fracture Rt shoulder surgery Sinus surgery refractory surgery to eyes dental plate  Family History: Reviewed history from 12/17/2006 and no changes required. father passed at age 34 of complications of age Pt had little contact with extended family Family History of Arthritis  Social History: Reviewed history from 11/20/2006 and no changes required. Retired Married Former Smoker Alcohol use-no Drug use-no  Review of Systems  The patient denies  anorexia, fever, weight loss, weight gain, vision loss, decreased hearing, hoarseness, chest pain, syncope, dyspnea on exertion, peripheral edema, prolonged cough, headaches, hemoptysis, abdominal pain, melena, hematochezia, severe indigestion/heartburn, hematuria, incontinence, genital sores, muscle weakness, suspicious skin lesions, transient blindness, difficulty walking, depression, unusual weight change, abnormal bleeding, enlarged lymph nodes, angioedema, breast masses, and testicular masses.    Physical Exam  General:  Well-developed,well-nourished,in no acute distress; alert,appropriate and cooperative throughout examination Head:  normocephalic and male-pattern balding.   Eyes:  pupils equal and pupils round.   Ears:  R ear normal and L ear normal.   Nose:  no external deformity and no nasal discharge.   Neck:  No deformities, masses, or tenderness noted. Lungs:  Normal respiratory effort, chest expands symmetrically. Lungs are clear to auscultation, no crackles or wheezes. Heart:  Normal rate and regular  rhythm. S1 and S2 normal without gallop, murmur, click, rub or other extra sounds.   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 One by mouth daily 2)  Zocor 40 Mg Tabs (Simvastatin) .... One by mouth daily 3)  Requip 1 Mg Tabs (Ropinirole hydrochloride) .... Take 1 tablet by mouth at bedtime 4)  Diovan 80 Mg Tabs (Valsartan) .... One by mouth daily 5)  Vesicare  10 Mg Tabs (Solifenacin succinate) .... Take 1 tablet by mouth once a day 6)  Zyrtec Allergy 10 Mg Tabs (Cetirizine hcl) .... Otc prn 7)  Uroxatral 10 Mg Tb24 (Alfuzosin hcl) .... Once daily 8)  Temazepam 30 Mg Caps (Temazepam) .... One by mouth q hs as needed for insomnia 9)  Omega-3 1000 Mg Caps (Omega-3 fatty acids) .[name].. 1 once daily 10)  B-100 Complex Tabs (Vitamins-lipotropics) .[name].. 1 once daily 11)  Anacin 81 Mg Tbec (Aspirin) .... Once daily 12)  Multivitamins Tabs (Multiple vitamin) .... Once daily 13)  Androgel 50 Mg/5gm Gel (Testosterone) .... One pk alternating with two packs every other day 14)  Valtrex 500 Mg Tabs (Valacyclovir hcl) .... One by mouth three times a day  as needed out break 15)  Vitamin E 400 Unit Caps (Vitamin e) .[name].. 1 once daily 16)  Ciprofloxacin Hcl 250 Mg Tabs (Ciprofloxacin hcl) .... One by mouth two times a day  Patient Instructions: 1)  Please schedule a follow-up appointment in 3 months. 2)  PSA prior to visit, ICD-9:601.9 3)  TESTOSETERONE 607.84 4)  rpr, URINE FOR CHLAMIDIA AND GC AND HIV 5)  v01.6 Prescriptions: CIPROFLOXACIN HCL 250 MG TABS (CIPROFLOXACIN HCL) ONE by mouth two times a day  #60 x 1   Entered and Authorized by:   Stacie Glaze MD   Signed by:   Stacie Glaze MD on 05/14/2010   Method used:   Electronically to        [facility]. [phone]* (retail)       [phone]       [place], [place]  [address]       Ph: [phone]       Fax: [phone]   RxID:   [id number]    Orders Added: 1)  Est. Patient Level IV [02725]

## 2010-07-31 ENCOUNTER — Telehealth: Payer: Self-pay | Admitting: Internal Medicine

## 2010-07-31 NOTE — Telephone Encounter (Signed)
Pt needs refills on meds: androgel, tamazepam..... Sent to [facility] order fax # [phone].

## 2010-08-02 ENCOUNTER — Other Ambulatory Visit: Payer: Self-pay | Admitting: *Deleted

## 2010-08-02 MED ORDER — TEMAZEPAM 30 MG PO CAPS: 30.0000 mg | ORAL_CAPSULE | Freq: Every evening | ORAL | Status: DC | PRN

## 2010-08-02 MED ORDER — TESTOSTERONE 50 MG/5GM (1%) TD GEL: 5.0000 g | Freq: Every day | TRANSDERMAL | Status: DC

## 2010-08-13 ENCOUNTER — Other Ambulatory Visit (INDEPENDENT_AMBULATORY_CARE_PROVIDER_SITE_OTHER): Payer: Medicare Other | Admitting: Internal Medicine

## 2010-08-13 DIAGNOSIS — N419 Inflammatory disease of prostate, unspecified: Secondary | ICD-10-CM

## 2010-08-13 DIAGNOSIS — Z206 Contact with and (suspected) exposure to human immunodeficiency virus [HIV]: Secondary | ICD-10-CM

## 2010-08-13 DIAGNOSIS — Z202 Contact with and (suspected) exposure to infections with a predominantly sexual mode of transmission: Secondary | ICD-10-CM

## 2010-08-13 DIAGNOSIS — N529 Male erectile dysfunction, unspecified: Secondary | ICD-10-CM

## 2010-08-13 DIAGNOSIS — Z20828 Contact with and (suspected) exposure to other viral communicable diseases: Secondary | ICD-10-CM

## 2010-08-13 LAB — RPR

## 2010-08-13 LAB — TESTOSTERONE: Testosterone: 409.54 ng/dL (ref 350.00–890.00)

## 2010-08-13 LAB — PSA: PSA: 7.12 ng/mL — ABNORMAL HIGH (ref 0.10–4.00)

## 2010-08-17 ENCOUNTER — Encounter: Payer: Self-pay | Admitting: Internal Medicine

## 2010-08-21 ENCOUNTER — Ambulatory Visit: Payer: Medicare Other | Admitting: Internal Medicine

## 2010-08-21 ENCOUNTER — Encounter: Payer: Self-pay | Admitting: Internal Medicine

## 2010-08-21 DIAGNOSIS — F528 Other sexual dysfunction not due to a substance or known physiological condition: Secondary | ICD-10-CM

## 2010-08-21 DIAGNOSIS — R197 Diarrhea, unspecified: Secondary | ICD-10-CM

## 2010-08-21 DIAGNOSIS — Z860100 Personal history of colon polyps, unspecified: Secondary | ICD-10-CM

## 2010-08-21 DIAGNOSIS — R972 Elevated prostate specific antigen [PSA]: Secondary | ICD-10-CM

## 2010-08-21 DIAGNOSIS — K529 Noninfective gastroenteritis and colitis, unspecified: Secondary | ICD-10-CM

## 2010-08-21 DIAGNOSIS — E291 Testicular hypofunction: Secondary | ICD-10-CM

## 2010-08-23 LAB — OVA AND PARASITE SCREEN: OP: NONE SEEN

## 2010-08-23 LAB — GIARDIA/CRYPTOSPORIDIUM (EIA): Giardia Screen (EIA): NEGATIVE

## 2010-08-26 LAB — STOOL CULTURE

## 2010-08-29 LAB — CLOSTRIDIUM DIFFICILE EIA: CDIFTX: NEGATIVE

## 2010-10-09 NOTE — Op Note (Signed)
NAME:  Alex Kemp, Alex Kemp NO.:  [id number]   MEDICAL RECORD NO.:  [id number]          PATIENT TYPE:  AMB   LOCATION:  [facility]                         FACILITY:  [facility]   PHYSICIAN:  Jamison Neighbor, M.D.  DATE OF BIRTH:  22-Mar-1941   DATE OF PROCEDURE:  04/05/2008  DATE OF DISCHARGE:                               OPERATIVE REPORT   SURGEON:  Jamison Neighbor, M.D.   ASSISTANT:  Judeen Hammans   PROCEDURES:  Insertion as Titan inflatable penile prosthesis.   PREOPERATIVE DIAGNOSIS:  Erectile dysfunction.   POSTOPERATIVE DIAGNOSIS:  Erectile dysfunction.   INDICATIONS:  [name] is 70 year old gentleman with medical  comorbidities of gastroesophageal reflux disease, hypertension, BPH,  hypogonadism who has had medication refractory to erectile dysfunction.  After discussion of the risks and benefits as well as alternatives, the  patient elected to proceed with inflatable penile prosthesis insertion.   PROCEDURE IN DETAIL:  The patient was brought back into the operating  room and after the successful induction of general endotracheal  anesthetic, he was prepped and draped in the usual sterile fashion after  a 10 minute perineal scrub.  An infrapubic incision was made  approximately  1 cm below the pubic symphysis.  Using cautery, we dissected down until  the corpora were identified.  We first identified the right corpora  cavernosa and the left corpora cavernosa.  At this point, a Foley  catheter was placed and the urethra could be felt along the posterior  side of the penile dissection.  Great care was taken not to injure the  neurovascular bundle of the penis running dorsally.  With further  dissection, we freed up the tunic albuginea  on the right and the left  corpora.  Ensuring that we were away from the urethra, holding sutures  of 2-0 Vicryl were placed on the right and left corpora.  They flanked  the intended corporal incision.  Using a knife, an  incision was first  made in the right corpora between the holding stitches.  Using the  measuring device, we completed our initial dilation proximally to the  pubic bone and distally to the glans.  We then continued our dilation  with dilators up to 14-French on the right side.  Measurements were  taken with 14 being the distal distance and 6 being the proximal  distance.   We then continued on the left side and made an incision between the two  holding stitches with the knife and the tunic albuginea.  First using  the measuring device, we did dilation and then continued our dilation  with dilators to a 14-French proximally to the bone and distally to the  glans.  The measurements on the left side were also 14 distal and 6  proximal.  With these measurements, we elected to proceed with an 18 cm  Titan inflatable prosthesis with a 2 cm cone.  Further, we elected to  use a 75 mL reservoir.  At this point, while the inflatable prosthesis  has been prepped, we made a retropubic space on the right  side for  placement of the reservoir.  The bladder was assured to be empty and  with blunt dissection we went behind the fascia and identified the pubic  bone and were able to get into the space of Retzius.  We then dissected  down along the right cord into the dependent portion of the right  hemiscrotum.  We cleared it free of attachments for placement of the  pump.  At this point, we used dilator devices on the right and left  side, distally first, to ensure no crossover.  We did the same  proximally to ensure no crossover.  The wound was copiously irrigated  with antibiotic solution and the right and left penile cylinders were  placed.  We tested them with 60 mL from an injection syringe and noted  good girth and distance from proximal to distal on the penis.  Satisfied, we emptied the penile cylinders and placed the reservoir into  the right space of Retzius that had been developed.  We filled  the  reservoir which had 75 mL of fluid.  At this point, the reservoir was  connected to the prosthesis with a feeding device.  The pump was placed  in the dependent portion of the right scrotum and the corporotomy was  closed with the flanking Vicryl sutures that had been previously placed.  With great care to avoid injury to any the tubing, we reapproximated  subcutaneous tissues in 2 layers.  We closed the skin with a running  suture.  Steri-Strips were placed over the incision and a compressive  dressing was placed.  At this point, the procedure was ended.  Please  note, Dr. Logan Bores was present throughout the entirety of the case.   ESTIMATED BLOOD LOSS:  Less than 50 mL.   URINE OUTPUT:  Unrecorded.   DRAINS:  Foley catheter.   SPECIMENS:  None.   DISPOSITION:  The patient will go to the PACU for further care.      Delman Kitten, MD      Jamison Neighbor, M.D.  Electronically Signed    DW/MEDQ  D:  04/05/2008  T:  04/05/2008  Job:  [id number]   cc:   Jamison Neighbor, M.D.  Fax: [phone]

## 2010-10-12 NOTE — Assessment & Plan Note (Signed)
[facility]                                 ON-CALL NOTE   NAME:Alex Kemp, Alex Kemp                          MRN:          [id number]  DATE:07/12/2006                            DOB:          1941/03/29    PRIMARY CARE PHYSICIAN:  Dr. Lovell Sheehan.   PHONE:  [phone].   SUBJECTIVE:  The patient has been congested for one week, with shortness  of breath, no fever.  He is fatigued.   ASSESSMENT AND PLAN:  Recommended appointment Monday morning with  primary.     Kerby Nora, MD  Electronically Signed    AB/MedQ  DD: 07/13/2006  DT: 07/13/2006  Job #: [id number]

## 2010-10-12 NOTE — Procedures (Signed)
NAME:  Alex Kemp, Alex Kemp NO.:  [id number]   MEDICAL RECORD NO.:  [id number]          PATIENT TYPE:  OUT   LOCATION:  [facility] CENTER                 FACILITY:  [facility]   PHYSICIAN:  Marcelyn Bruins, M.D. [facility] DATE OF BIRTH:  March 16, 1941   DATE OF STUDY:  10/08/2004                              NOCTURNAL POLYSOMNOGRAM   PROCEDURE:  Nocturnal polysomnogram.   PULMONOLOGIST:  Marcelyn Bruins, M.D.   REFERRING PHYSICIAN:  Stacie Glaze, M.D.   INDICATION FOR THIS STUDY:  Hypersomnia with sleep apnea.   RESULTS:  Epworth score: 17.  Sleep architecture: The patient had a total  sleep time of 480 minutes with decreased slow wave sleep but adequate REM.  Sleep onset and REM onset were both normal.   IMPRESSION:  1. Mild obstructive sleep apnea/hypopnea syndrome with a respiratory      disturbance index of 14 events per hour and O2 desaturation as low as      84%.  The patient's events were not positional and consisted primarily      of hypopneas.  The patient did not meet split night criteria secondary      to the majority of the events occurring after soon after 12 midnight.      There was loud snoring noted throughout the study.  Treatment for this      degree of sleep apnea may include weight loss, upper airway surgery,      oral appliance, and C-PAP.  Clinical correlation is suggested.  2. Occasional premature ventricular contraction noted.  3. Very large numbers of leg jerks with very significant sleep disruption.      I suspect this is a major contributor to the patient's sleeping      difficulties and daytime sleepiness.        KC/MEDQ  D:  10/24/2004 12:03:00  T:  10/24/2004 12:32:47  Job:  [id number]

## 2010-11-15 ENCOUNTER — Telehealth: Payer: Self-pay | Admitting: *Deleted

## 2010-11-15 MED ORDER — CYCLOBENZAPRINE HCL 10 MG PO TABS: 10.0000 mg | ORAL_TABLET | Freq: Three times a day (TID) | ORAL | Status: DC | PRN

## 2010-11-15 MED ORDER — CYCLOBENZAPRINE HCL 10 MG PO TABS: 10.0000 mg | ORAL_TABLET | Freq: Three times a day (TID) | ORAL | Status: AC | PRN

## 2010-11-15 MED ORDER — DICLOFENAC SODIUM 75 MG PO TBEC: 75.0000 mg | DELAYED_RELEASE_TABLET | Freq: Two times a day (BID) | ORAL | Status: AC

## 2010-11-15 MED ORDER — DICLOFENAC SODIUM 75 MG PO TBEC: 75.0000 mg | DELAYED_RELEASE_TABLET | Freq: Two times a day (BID) | ORAL | Status: DC

## 2010-11-15 NOTE — Telephone Encounter (Signed)
Pt strained lower back yesterday, and would like and antiflammatory and muscle relaxer called to [facility] [place].

## 2010-11-15 NOTE — Telephone Encounter (Signed)
Pt. Notified.

## 2010-11-15 NOTE — Telephone Encounter (Signed)
Per dr Lovell Sheehan- may have flexeril 10 mg tid for 10 days #30 and diclofenac 75 bid #20

## 2010-11-21 ENCOUNTER — Telehealth: Payer: Self-pay | Admitting: Internal Medicine

## 2010-12-17 ENCOUNTER — Encounter: Payer: Self-pay | Admitting: Internal Medicine

## 2010-12-17 ENCOUNTER — Ambulatory Visit (INDEPENDENT_AMBULATORY_CARE_PROVIDER_SITE_OTHER): Payer: Medicare Other | Admitting: Internal Medicine

## 2010-12-17 DIAGNOSIS — Z202 Contact with and (suspected) exposure to infections with a predominantly sexual mode of transmission: Secondary | ICD-10-CM | POA: Insufficient documentation

## 2010-12-17 DIAGNOSIS — E785 Hyperlipidemia, unspecified: Secondary | ICD-10-CM

## 2010-12-17 DIAGNOSIS — E291 Testicular hypofunction: Secondary | ICD-10-CM

## 2010-12-17 DIAGNOSIS — Z0000 Encounter for general adult medical examination without abnormal findings: Secondary | ICD-10-CM

## 2010-12-17 DIAGNOSIS — R51 Headache: Secondary | ICD-10-CM

## 2010-12-17 DIAGNOSIS — R197 Diarrhea, unspecified: Secondary | ICD-10-CM

## 2010-12-17 DIAGNOSIS — N411 Chronic prostatitis: Secondary | ICD-10-CM

## 2010-12-17 DIAGNOSIS — I10 Essential (primary) hypertension: Secondary | ICD-10-CM

## 2010-12-17 LAB — RPR

## 2010-12-17 LAB — POCT URINALYSIS DIPSTICK
Blood, UA: NEGATIVE
Glucose, UA: NEGATIVE
Spec Grav, UA: 1.025
pH, UA: 5

## 2010-12-17 NOTE — Progress Notes (Signed)
Subjective:    Patient ID: Alex Kemp, male    DOB: [birth year], 70 y.o.   MRN: [id number]  HPI    Review of Systems     Objective:   Physical Exam        Assessment & Plan:   Subjective:    Alex Kemp is a 70 y.o. male who presents for Medicare Annual/Subsequent preventive examination.   Preventive Screening-Counseling & Management  Tobacco History  Smoking status  . Former Smoker  . Quit date: 12/26/1978  Smokeless tobacco  . Not on file    Problems Prior to Visit 1.   Current Problems (verified) Patient Active Problem List  Diagnoses  . HERPES LABIALIS  . TESTICULAR HYPOFUNCTION  . HYPERLIPIDEMIA  . ERECTILE DYSFUNCTION  . HYPERTENSION  . GERD  . COLONIC POLYPS, ADENOMATOUS, BENIGN  . INSOMNIA  . HEADACHE  . ELEVATED PROSTATE SPECIFIC ANTIGEN  . INSECT BITE  . COLONIC POLYPS, HX OF  . PROSTATITIS, CHRONIC  . Diarrhea  . Possible exposure to STD    Medications Prior to Visit Current Outpatient Prescriptions on File Prior to Visit  Medication Sig Dispense Refill  . alfuzosin (UROXATRAL) 10 MG 24 hr tablet Take 10 mg by mouth daily.        [name] aspirin (ANACIN) 81 MG EC tablet Take 81 mg by mouth daily.        . cetirizine (ZYRTEC) 10 MG tablet Take 10 mg by mouth daily.        . cyclobenzaprine (FLEXERIL) 10 MG tablet Take 1 tablet (10 mg total) by mouth 3 (three) times daily as needed for muscle spasms.  30 tablet  1  . diclofenac (VOLTAREN) 75 MG EC tablet Take 1 tablet (75 mg total) by mouth 2 (two) times daily with a meal.  20 tablet  0  . loperamide (IMODIUM) 2 MG capsule Take 2 mg by mouth 4 (four) times daily as needed.        . Multiple Vitamin (MULTIVITAMIN) capsule Take 1 capsule by mouth daily.        . OMEGA 3 1000 MG CAPS Take by mouth daily.        . pantoprazole (PROTONIX) 40 MG tablet Take 40 mg by mouth daily.        [name] rOPINIRole (REQUIP) 1 MG tablet Take 1 mg by mouth at bedtime.        . simvastatin (ZOCOR) 40 MG tablet Take 40  mg by mouth at bedtime.        . solifenacin (VESICARE) 10 MG tablet Take 5 mg by mouth daily.        . valACYclovir (VALTREX) 500 MG tablet Take 500 mg by mouth 3 (three) times daily as needed.       . valsartan (DIOVAN) 80 MG tablet Take 80 mg by mouth daily.        . vitamin E 400 UNIT capsule Take 400 Units by mouth daily.        . Vitamins-Lipotropics (B-100 COMPLEX) TABS Take by mouth daily.          Current Medications (verified) Current Outpatient Prescriptions  Medication Sig Dispense Refill  . alfuzosin (UROXATRAL) 10 MG 24 hr tablet Take 10 mg by mouth daily.        [name] aspirin (ANACIN) 81 MG EC tablet Take 81 mg by mouth daily.        . cetirizine (ZYRTEC) 10 MG tablet Take 10 mg by mouth daily.        [name]  cyclobenzaprine (FLEXERIL) 10 MG tablet Take 1 tablet (10 mg total) by mouth 3 (three) times daily as needed for muscle spasms.  30 tablet  1  . diclofenac (VOLTAREN) 75 MG EC tablet Take 1 tablet (75 mg total) by mouth 2 (two) times daily with a meal.  20 tablet  0  . loperamide (IMODIUM) 2 MG capsule Take 2 mg by mouth 4 (four) times daily as needed.        . Multiple Vitamin (MULTIVITAMIN) capsule Take 1 capsule by mouth daily.        . OMEGA 3 1000 MG CAPS Take by mouth daily.        . pantoprazole (PROTONIX) 40 MG tablet Take 40 mg by mouth daily.        . Probiotic Product (PROBIOTIC FORMULA PO) Take by mouth daily.        [name] rOPINIRole (REQUIP) 1 MG tablet Take 1 mg by mouth at bedtime.        . simvastatin (ZOCOR) 40 MG tablet Take 40 mg by mouth at bedtime.        . solifenacin (VESICARE) 10 MG tablet Take 5 mg by mouth daily.        . valACYclovir (VALTREX) 500 MG tablet Take 500 mg by mouth 3 (three) times daily as needed.       . valsartan (DIOVAN) 80 MG tablet Take 80 mg by mouth daily.        . vitamin E 400 UNIT capsule Take 400 Units by mouth daily.        . Vitamins-Lipotropics (B-100 COMPLEX) TABS Take by mouth daily.           Allergies  (verified) Penicillins   PAST HISTORY  Family History Family History  Problem Relation Age of Onset  . Arthritis Other     Social History History  Substance Use Topics  . Smoking status: Former Smoker    Quit date: 12/26/1978  . Smokeless tobacco: Not on file  . Alcohol Use: Yes    Are there smokers in your home (other than you)?  No  Risk Factors Current exercise habits: Gym/ health club routine includes cardio, light weights and treadmill.  Dietary issues discussed: none   Cardiac risk factors: hypertension.  Depression Screen (Note: if answer to either of the following is "Yes", a more complete depression screening is indicated)   Q1: Over the past two weeks, have you felt down, depressed or hopeless? No  Q2: Over the past two weeks, have you felt little interest or pleasure in doing things? No  Have you lost interest or pleasure in daily life? No  Do you often feel hopeless? No  Do you cry easily over simple problems? No  Activities of Daily Living In your present state of health, do you have any difficulty performing the following activities?:  Driving? No Managing money?  No Feeding yourself? No Getting from bed to chair? No Climbing a flight of stairs? No Preparing food and eating?: No Bathing or showering? No Getting dressed: No Getting to the toilet? No Using the toilet:No Moving around from place to place: No In the past year have you fallen or had a near fall?:No   Are you sexually active?  No  Do you have more than one partner?  No  Hearing Difficulties: No Do you often ask people to speak up or repeat themselves? No Do you experience ringing or noises in your ears? No Do you have difficulty understanding  soft or whispered voices? No   Do you feel that you have a problem with memory? No  Do you often misplace items? No  Do you feel safe at home?  No  Cognitive Testing  Alert? Yes  Normal Appearance?Yes  Oriented to person? Yes  Place? Yes    Time? Yes  Recall of three objects?  Yes  Can perform simple calculations? Yes  Displays appropriate judgment?Yes  Can read the correct time from a watch face?Yes   Advanced Directives have been discussed with the patient? Yes   List the Names of Other Physician/Practitioners you currently use: 1.    Indicate any recent Medical Services you may have received from other than [facility] providers in the past year (date may be approximate).  Immunization History  Administered Date(s) Administered  . Influenza Whole 03/17/2007, 05/31/2008, 03/30/2009, 01/26/2010  . Pneumococcal Polysaccharide 05/27/2006  . Td 05/27/2006    Screening Tests Health Maintenance  Topic Date Due  . Colonoscopy  10/27/1991  . Zostavax  10/26/2001  . Pneumococcal Polysaccharide Vaccine Age 37 And Over  10/27/2006  . Influenza Vaccine  02/25/2011  . Tetanus/tdap  05/27/2016    All answers were reviewed with the patient and necessary referrals were made:  Carrie Mew   12/17/2010   History reviewed: allergies, current medications, past family history, past medical history, past social history, past surgical history and problem list  Review of Systems A comprehensive review of systems was negative.    Objective:     Vision by Snellen chart: right eye:20/20, left eye:20/20 Blood pressure 116/80, pulse 88, temperature 98.2 F (36.8 C), resp. rate 16, height 5\' 11"  (1.803 m), weight 218 lb (98.884 kg). Body mass index is 30.40 kg/(m^2).  BP 116/80  Pulse 88  Temp 98.2 F (36.8 C)  Resp 16  Ht 5\' 11"  (1.803 m)  Wt 218 lb (98.884 kg)  BMI 30.40 kg/m2  General Appearance:    Alert, cooperative, no distress, appears stated age  Head:    Normocephalic, without obvious abnormality, atraumatic  Eyes:    PERRL, conjunctiva/corneas clear, EOM's intact, fundi    benign, both eyes       Ears:    Normal TM's and external ear canals, both ears  Nose:   Nares normal, septum midline, mucosa normal, no drainage     or sinus tenderness  Throat:   Lips, mucosa, and tongue normal; teeth and gums normal  Neck:   Supple, symmetrical, trachea midline, no adenopathy;       thyroid:  No enlargement/tenderness/nodules; no carotid   bruit or JVD  Back:     Symmetric, no curvature, ROM normal, no CVA tenderness  Lungs:     Clear to auscultation bilaterally, respirations unlabored  Chest wall:    No tenderness or deformity  Heart:    Regular rate and rhythm, S1 and S2 normal, no murmur, rub   or gallop  Abdomen:     Soft, non-tender, bowel sounds active all four quadrants,    no masses, no organomegaly  Genitalia:    Normal male without lesion, discharge or tenderness  Rectal:    Normal tone, normal prostate, no masses or tenderness;   guaiac negative stool  Extremities:   Extremities normal, atraumatic, no cyanosis or edema  Pulses:   2+ and symmetric all extremities  Skin:   Skin color, texture, turgor normal, no rashes or lesions  Lymph nodes:   Cervical, supraclavicular, and axillary nodes normal  Neurologic:  CNII-XII intact. Normal strength, sensation and reflexes      throughout       Assessment:     Patient presents for yearly preventative medicine examination.   all immunizations and health maintenance protocols were reviewed with the patient and they are up to date with these protocols.   screening laboratory values were reviewed with the patient including screening of hyperlipidemia PSA renal function and hepatic function.   There medications past medical history social history problem list and allergies were reviewed in detail.   Goals were established with regard to weight loss exercise diet in compliance with medications    patient is sexually active with one partner therefore screening for sexual transmitted diseases as part of our examination today.  We also discussed his cholesterol monitoring and will plan to monitor his lipids and liver. We will monitor testosterone for his  testicular hypofunction His GERD chronic headaches are stable at this time  He also has a history of elevated PSA and has been by a urologist was found biopsies and he is monitored by the urologist at this time for his PSA   Plan:     During the course of the visit the patient was educated and counseled about appropriate screening and preventive services including:    Influenza vaccine  STD prevention  Diet review for nutrition referral? Yes ____  Not Indicated ____   Patient Instructions (the written plan) was given to the patient.  Medicare Attestation I have personally reviewed: The patient's medical and social history Their use of alcohol, tobacco or illicit drugs Their current medications and supplements The patient's functional ability including ADLs,fall risks, home safety risks, cognitive, and hearing and visual impairment Diet and physical activities Evidence for depression or mood disorders  The patient's weight, height, BMI, and visual acuity have been recorded in the chart.  I have made referrals, counseling, and provided education to the patient based on review of the above and I have provided the patient with a written personalized care plan for preventive services.    The patient was given additional counseling for greater than 30 minutes on STD prevention and screening for STDs  Carrie Mew   12/17/2010

## 2010-12-18 LAB — GC/CHLAMYDIA PROBE AMP, URINE
Chlamydia, Swab/Urine, PCR: NEGATIVE
GC Probe Amp, Urine: NEGATIVE

## 2010-12-24 NOTE — Progress Notes (Signed)
Pt informed

## 2011-01-15 NOTE — Telephone Encounter (Signed)
In error

## 2011-02-11 ENCOUNTER — Other Ambulatory Visit: Payer: Self-pay | Admitting: Internal Medicine

## 2011-02-26 LAB — POCT I-STAT 4, (NA,K, GLUC, HGB,HCT)
Glucose, Bld: 97
Potassium: 4.5
Sodium: 144

## 2011-03-05 ENCOUNTER — Other Ambulatory Visit: Payer: Self-pay | Admitting: *Deleted

## 2011-03-05 MED ORDER — PANTOPRAZOLE SODIUM 40 MG PO TBEC: 40.0000 mg | DELAYED_RELEASE_TABLET | Freq: Every day | ORAL | Status: DC

## 2011-03-08 ENCOUNTER — Other Ambulatory Visit: Payer: Self-pay | Admitting: Internal Medicine

## 2011-03-26 ENCOUNTER — Other Ambulatory Visit: Payer: Self-pay | Admitting: *Deleted

## 2011-03-26 MED ORDER — ROPINIROLE HCL 1 MG PO TABS: 1.0000 mg | ORAL_TABLET | Freq: Every day | ORAL | Status: DC

## 2011-03-26 MED ORDER — VALSARTAN 80 MG PO TABS: 80.0000 mg | ORAL_TABLET | Freq: Every day | ORAL | Status: DC

## 2011-04-29 ENCOUNTER — Other Ambulatory Visit: Payer: Self-pay | Admitting: *Deleted

## 2011-06-19 ENCOUNTER — Ambulatory Visit (INDEPENDENT_AMBULATORY_CARE_PROVIDER_SITE_OTHER): Payer: Medicare Other | Admitting: Internal Medicine

## 2011-06-19 ENCOUNTER — Encounter: Payer: Self-pay | Admitting: Internal Medicine

## 2011-06-19 ENCOUNTER — Ambulatory Visit (INDEPENDENT_AMBULATORY_CARE_PROVIDER_SITE_OTHER)
Admission: RE | Admit: 2011-06-19 | Discharge: 2011-06-19 | Disposition: A | Discharge status: Home / Self Care | Payer: Medicare Other | Source: Ambulatory Visit | Attending: Internal Medicine | Admitting: Internal Medicine

## 2011-06-19 VITALS — BP 140/80 | HR 72 | Temp 97.8°F | Resp 16 | Ht 71.0 in | Wt 234.0 lb

## 2011-06-19 DIAGNOSIS — E291 Testicular hypofunction: Secondary | ICD-10-CM

## 2011-06-19 DIAGNOSIS — R972 Elevated prostate specific antigen [PSA]: Secondary | ICD-10-CM

## 2011-06-19 DIAGNOSIS — I10 Essential (primary) hypertension: Secondary | ICD-10-CM

## 2011-06-19 DIAGNOSIS — F528 Other sexual dysfunction not due to a substance or known physiological condition: Secondary | ICD-10-CM

## 2011-06-19 DIAGNOSIS — R0602 Shortness of breath: Secondary | ICD-10-CM

## 2011-06-19 DIAGNOSIS — E785 Hyperlipidemia, unspecified: Secondary | ICD-10-CM | POA: Diagnosis not present

## 2011-06-19 LAB — LIPID PANEL
Cholesterol: 169 mg/dL (ref 0–200)
HDL: 59.3 mg/dL (ref >39.00)
Total CHOL/HDL Ratio: 3
Triglycerides: 92 mg/dL (ref 0.0–149.0)

## 2011-06-19 LAB — HEPATIC FUNCTION PANEL
Bilirubin, Direct: 0.1 mg/dL (ref 0.0–0.3)
Total Bilirubin: 0.7 mg/dL (ref 0.3–1.2)
Total Protein: 7.1 g/dL (ref 6.0–8.3)

## 2011-06-19 LAB — BASIC METABOLIC PANEL
BUN: 14 mg/dL (ref 6–23)
CO2: 24 mEq/L (ref 19–32)
Calcium: 9 mg/dL (ref 8.4–10.5)
Creatinine, Ser: 1.3 mg/dL (ref 0.4–1.5)

## 2011-06-19 NOTE — Patient Instructions (Signed)
The patient is instructed to continue all medications as prescribed. Schedule followup with check out clerk upon leaving the clinic  

## 2011-06-19 NOTE — Progress Notes (Signed)
Subjective:    Patient ID: Alex Kemp, male    DOB: Oct 13, 1940, 71 y.o.   MRN: [id number]  Hypertension Pertinent negatives include no neck pain or shortness of breath.  Hyperlipidemia Pertinent negatives include no shortness of breath.  Gastrophageal Reflux He reports no abdominal pain, no coughing or no wheezing. Pertinent negatives include no fatigue.   The pt has gained weight  Increased acid reflux and some asthma response.  Seeing the urologist   Review of Systems  Constitutional: Negative for fever and fatigue.  HENT: Negative for hearing loss, congestion, neck pain and postnasal drip.   Eyes: Negative for discharge, redness and visual disturbance.  Respiratory: Negative for cough, shortness of breath and wheezing.   Cardiovascular: Negative for leg swelling.  Gastrointestinal: Negative for abdominal pain, constipation and abdominal distention.  Genitourinary: Negative for urgency and frequency.  Musculoskeletal: Negative for joint swelling and arthralgias.  Skin: Negative for color change and rash.  Neurological: Negative for weakness and light-headedness.  Hematological: Negative for adenopathy.  Psychiatric/Behavioral: Negative for behavioral problems.   Past Medical History  Diagnosis Date  . GERD (gastroesophageal reflux disease)   . Headache   . Hyperlipidemia   . Insomnia   . Hypertension   . Corneal injury   . Hx of colonic polyps     History   Social History  . Marital Status: Married    Spouse Name: N/A    Number of Children: N/A  . Years of Education: N/A   Occupational History  . Not on file.   Social History Main Topics  . Smoking status: Former Smoker    Quit date: 12/26/1978  . Smokeless tobacco: Not on file  . Alcohol Use: Yes  . Drug Use: No  . Sexually Active: Yes   Other Topics Concern  . Not on file   Social History Narrative  . No narrative on file    Past Surgical History  Procedure Date  . Panedoscopy 04/23/1999  .  Lt are fracture   . Rt shoulder surgery   . Nasal sinus surgery   . Corneal injury   . Refreactory surgery to eyes   . Dental plate     Family History  Problem Relation Age of Onset  . Arthritis Other     Allergies  Allergen Reactions  . Penicillins     REACTION: ? childhood    Current Outpatient Prescriptions on File Prior to Visit  Medication Sig Dispense Refill  . alfuzosin (UROXATRAL) 10 MG 24 hr tablet TAKE 1 TABLET DAILY  90 tablet  2  . aspirin (ANACIN) 81 MG EC tablet Take 81 mg by mouth daily.        . cetirizine (ZYRTEC) 10 MG tablet Take 10 mg by mouth daily.        . cyclobenzaprine (FLEXERIL) 10 MG tablet Take 1 tablet (10 mg total) by mouth 3 (three) times daily as needed for muscle spasms.  30 tablet  1  . diclofenac (VOLTAREN) 75 MG EC tablet Take 1 tablet (75 mg total) by mouth 2 (two) times daily with a meal.  20 tablet  0  . loperamide (IMODIUM) 2 MG capsule Take 2 mg by mouth 4 (four) times daily as needed.        . Multiple Vitamin (MULTIVITAMIN) capsule Take 1 capsule by mouth daily.        . OMEGA 3 1000 MG CAPS Take by mouth daily.        . pantoprazole (PROTONIX)  40 MG tablet Take 1 tablet (40 mg total) by mouth daily.  90 tablet  3  . Probiotic Product (PROBIOTIC FORMULA PO) Take by mouth daily.        [name] rOPINIRole (REQUIP) 1 MG tablet Take 1 tablet (1 mg total) by mouth at bedtime.  90 tablet  3  . simvastatin (ZOCOR) 40 MG tablet TAKE 1 TABLET DAILY  90 tablet  2  . solifenacin (VESICARE) 10 MG tablet Take 5 mg by mouth daily.        . valACYclovir (VALTREX) 500 MG tablet Take 500 mg by mouth 3 (three) times daily as needed.       . valsartan (DIOVAN) 80 MG tablet Take 1 tablet (80 mg total) by mouth daily.  90 tablet  3  . vitamin E 400 UNIT capsule Take 400 Units by mouth daily.        . Vitamins-Lipotropics (B-100 COMPLEX) TABS Take by mouth daily.          BP 140/80  Pulse 72  Temp 97.8 F (36.6 C)  Resp 16  Ht 5\' 11"  (1.803 m)  Wt 234 lb  (106.142 kg)  BMI 32.64 kg/m2       Objective:   Physical Exam  Nursing note and vitals reviewed. Constitutional: He appears well-developed and well-nourished.  HENT:  Head: Normocephalic and atraumatic.  Eyes: Conjunctivae are normal. Pupils are equal, round, and reactive to light.  Neck: Normal range of motion. Neck supple.  Cardiovascular: Normal rate and regular rhythm.   Pulmonary/Chest: Effort normal and breath sounds normal.  Abdominal: Soft. Bowel sounds are normal.          Assessment & Plan:  Elevated PSA and negative biopsy HTN stable but weight gain has influenced blood pressure control with increased reading Increased sob and cough CXR and consider the CT if the cxr shows dz

## 2011-07-30 ENCOUNTER — Other Ambulatory Visit: Payer: Self-pay | Admitting: *Deleted

## 2011-07-30 MED ORDER — SOLIFENACIN SUCCINATE 10 MG PO TABS: 5.0000 mg | ORAL_TABLET | Freq: Every day | ORAL | Status: DC

## 2011-08-01 ENCOUNTER — Other Ambulatory Visit: Payer: Self-pay | Admitting: *Deleted

## 2011-08-07 DIAGNOSIS — N529 Male erectile dysfunction, unspecified: Secondary | ICD-10-CM | POA: Insufficient documentation

## 2011-08-07 DIAGNOSIS — R350 Frequency of micturition: Secondary | ICD-10-CM | POA: Insufficient documentation

## 2011-08-07 DIAGNOSIS — N138 Other obstructive and reflux uropathy: Secondary | ICD-10-CM | POA: Insufficient documentation

## 2011-08-07 DIAGNOSIS — E291 Testicular hypofunction: Secondary | ICD-10-CM | POA: Diagnosis not present

## 2011-08-07 DIAGNOSIS — N400 Enlarged prostate without lower urinary tract symptoms: Secondary | ICD-10-CM | POA: Diagnosis not present

## 2011-11-06 ENCOUNTER — Other Ambulatory Visit: Payer: Self-pay | Admitting: *Deleted

## 2011-11-06 MED ORDER — TESTOSTERONE 50 MG/5GM (1%) TD GEL: TRANSDERMAL | Status: DC

## 2011-11-26 DIAGNOSIS — R350 Frequency of micturition: Secondary | ICD-10-CM | POA: Diagnosis not present

## 2011-12-02 ENCOUNTER — Other Ambulatory Visit: Payer: Self-pay | Admitting: *Deleted

## 2011-12-02 MED ORDER — ALFUZOSIN HCL ER 10 MG PO TB24: 10.0000 mg | ORAL_TABLET | Freq: Every day | ORAL | Status: DC

## 2011-12-11 DIAGNOSIS — R350 Frequency of micturition: Secondary | ICD-10-CM | POA: Diagnosis not present

## 2011-12-11 DIAGNOSIS — E291 Testicular hypofunction: Secondary | ICD-10-CM | POA: Diagnosis not present

## 2011-12-11 DIAGNOSIS — N400 Enlarged prostate without lower urinary tract symptoms: Secondary | ICD-10-CM | POA: Diagnosis not present

## 2011-12-11 DIAGNOSIS — N529 Male erectile dysfunction, unspecified: Secondary | ICD-10-CM | POA: Diagnosis not present

## 2011-12-24 ENCOUNTER — Encounter: Payer: Self-pay | Admitting: Internal Medicine

## 2011-12-24 ENCOUNTER — Ambulatory Visit (INDEPENDENT_AMBULATORY_CARE_PROVIDER_SITE_OTHER)
Admission: RE | Admit: 2011-12-24 | Discharge: 2011-12-24 | Disposition: A | Discharge status: Home / Self Care | Payer: Medicare Other | Source: Ambulatory Visit | Attending: Internal Medicine | Admitting: Internal Medicine

## 2011-12-24 ENCOUNTER — Ambulatory Visit (INDEPENDENT_AMBULATORY_CARE_PROVIDER_SITE_OTHER): Payer: Medicare Other | Admitting: Internal Medicine

## 2011-12-24 VITALS — BP 110/74 | HR 84 | Temp 98.6°F | Resp 16 | Ht 72.0 in | Wt 228.0 lb

## 2011-12-24 DIAGNOSIS — I10 Essential (primary) hypertension: Secondary | ICD-10-CM | POA: Diagnosis not present

## 2011-12-24 DIAGNOSIS — G8929 Other chronic pain: Secondary | ICD-10-CM

## 2011-12-24 DIAGNOSIS — Z0000 Encounter for general adult medical examination without abnormal findings: Secondary | ICD-10-CM

## 2011-12-24 DIAGNOSIS — M5416 Radiculopathy, lumbar region: Secondary | ICD-10-CM

## 2011-12-24 DIAGNOSIS — IMO0002 Reserved for concepts with insufficient information to code with codable children: Secondary | ICD-10-CM

## 2011-12-24 DIAGNOSIS — E785 Hyperlipidemia, unspecified: Secondary | ICD-10-CM

## 2011-12-24 DIAGNOSIS — R972 Elevated prostate specific antigen [PSA]: Secondary | ICD-10-CM | POA: Diagnosis not present

## 2011-12-24 DIAGNOSIS — M47817 Spondylosis without myelopathy or radiculopathy, lumbosacral region: Secondary | ICD-10-CM | POA: Diagnosis not present

## 2011-12-24 DIAGNOSIS — T887XXA Unspecified adverse effect of drug or medicament, initial encounter: Secondary | ICD-10-CM | POA: Diagnosis not present

## 2011-12-24 LAB — POCT URINALYSIS DIPSTICK
Blood, UA: NEGATIVE
Glucose, UA: NEGATIVE
Nitrite, UA: NEGATIVE
Urobilinogen, UA: 0.2
pH, UA: 6

## 2011-12-24 LAB — BASIC METABOLIC PANEL
BUN: 18 mg/dL (ref 6–23)
CO2: 25 mEq/L (ref 19–32)
Chloride: 108 mEq/L (ref 96–112)
Creatinine, Ser: 1.4 mg/dL (ref 0.4–1.5)
Potassium: 4.7 mEq/L (ref 3.5–5.1)

## 2011-12-24 LAB — CBC WITH DIFFERENTIAL/PLATELET
Basophils Absolute: 0 10*3/uL (ref 0.0–0.1)
Eosinophils Absolute: 0.3 10*3/uL (ref 0.0–0.7)
Lymphocytes Relative: 30.3 % (ref 12.0–46.0)
MCHC: 34.4 g/dL (ref 30.0–36.0)
Neutrophils Relative %: 55.1 % (ref 43.0–77.0)
RBC: 4.63 Mil/uL (ref 4.22–5.81)
RDW: 14.3 % (ref 11.5–14.6)

## 2011-12-24 LAB — HEPATIC FUNCTION PANEL
ALT: 20 U/L (ref 0–53)
AST: 19 U/L (ref 0–37)
Bilirubin, Direct: 0.1 mg/dL (ref 0.0–0.3)
Total Bilirubin: 0.5 mg/dL (ref 0.3–1.2)
Total Protein: 6.7 g/dL (ref 6.0–8.3)

## 2011-12-24 LAB — LIPID PANEL
LDL Cholesterol: 100 mg/dL — ABNORMAL HIGH (ref 0–99)
Total CHOL/HDL Ratio: 3

## 2011-12-24 LAB — TSH: TSH: 2.55 u[IU]/mL (ref 0.35–5.50)

## 2011-12-24 LAB — PSA: PSA: 4.51 ng/mL — ABNORMAL HIGH (ref 0.10–4.00)

## 2011-12-24 MED ORDER — TESTOSTERONE 50 MG/5GM (1%) TD GEL: TRANSDERMAL | Status: DC

## 2011-12-24 MED ORDER — ROPINIROLE HCL 1 MG PO TABS: 1.0000 mg | ORAL_TABLET | Freq: Every day | ORAL | Status: DC

## 2011-12-24 MED ORDER — SIMVASTATIN 40 MG PO TABS: 40.0000 mg | ORAL_TABLET | Freq: Every day | ORAL | Status: DC

## 2011-12-24 MED ORDER — CYCLOBENZAPRINE HCL 10 MG PO TABS: 10.0000 mg | ORAL_TABLET | Freq: Three times a day (TID) | ORAL | Status: AC | PRN

## 2011-12-24 MED ORDER — METHYLPREDNISOLONE (PAK) 4 MG PO TABS: ORAL_TABLET | ORAL | Status: AC

## 2011-12-24 NOTE — Progress Notes (Signed)
Subjective:    Patient ID: Alex Kemp, male    DOB: 12/05/1940, 71 y.o.   MRN: [id number]  HPI Patient is a 71 year old male who presents for Medicare wellness examination in addition he has followup of chronic medical problems including hyperlipidemia testicular hypofunction testosterone levels hypertension and a history of gastroesophageal reflux.  He also has history of elevated PSA which we'll monitor her blood work today.  He has an additional acute complaints the first complaint involves low back discomfort he states it is positional in that when he curves his back sitting he can alleviate the discomfort when he stands he gets pain that generally radiates more to the right than to the left and stops approximately halfway into the buttock area. The patient has been reproducible for approximately one month. There are no records of any back images.   Review of Systems  Constitutional: Negative for fever and fatigue.  HENT: Negative for hearing loss, congestion, neck pain and postnasal drip.   Eyes: Negative for discharge, redness and visual disturbance.  Respiratory: Negative for cough, shortness of breath and wheezing.   Cardiovascular: Negative for leg swelling.  Gastrointestinal: Negative for abdominal pain, constipation and abdominal distention.  Genitourinary: Negative for urgency and frequency.  Musculoskeletal: Positive for myalgias, back pain, arthralgias and gait problem. Negative for joint swelling.  Skin: Negative for color change and rash.  Neurological: Negative for weakness and light-headedness.  Hematological: Negative for adenopathy.  Psychiatric/Behavioral: Negative for behavioral problems.       Objective:   Physical Exam  Nursing note and vitals reviewed. Constitutional: He is oriented to person, place, and time. He appears well-developed and well-nourished.  HENT:  Head: Normocephalic and atraumatic.  Mouth/Throat: Oropharynx is clear and moist.  Eyes:  Conjunctivae are normal. Pupils are equal, round, and reactive to light.  Neck: Normal range of motion. Neck supple.  Cardiovascular: Normal rate and regular rhythm.   Murmur heard. Pulmonary/Chest: Effort normal and breath sounds normal.  Abdominal: He exhibits distension. There is no tenderness. There is no rebound.  Genitourinary: Rectum normal.       enlarged  Musculoskeletal: Normal range of motion.  Neurological: He is alert and oriented to person, place, and time.  Skin: Skin is warm and dry.  Psychiatric: He has a normal mood and affect. His behavior is normal.          Assessment & Plan:  monitoring lipids and liver for effect of medications ( zocor) Evaluate the low back pain with x-rays and add a muscle relaxant and antiinflamatory monitor testosterone on androgel Stable blood pressure Stable GERD  Subjective:    Alex Kemp is a 71 y.o. male who presents for Medicare Annual/Subsequent preventive examination.   Preventive Screening-Counseling & Management  Tobacco History  Smoking status  . Former Smoker  . Quit date: 12/26/1978  Smokeless tobacco  . Not on file    Problems Prior to Visit 1.   Current Problems (verified) Patient Active Problem List  Diagnosis  . HERPES LABIALIS  . TESTICULAR HYPOFUNCTION  . HYPERLIPIDEMIA  . ERECTILE DYSFUNCTION  . HYPERTENSION  . GERD  . COLONIC POLYPS, ADENOMATOUS, BENIGN  . INSOMNIA  . HEADACHE  . ELEVATED PROSTATE SPECIFIC ANTIGEN  . INSECT BITE  . COLONIC POLYPS, HX OF  . PROSTATITIS, CHRONIC  . Diarrhea  . Possible exposure to STD    Medications Prior to Visit Current Outpatient Prescriptions on File Prior to Visit  Medication Sig Dispense Refill  . alfuzosin (UROXATRAL) 10  MG 24 hr tablet Take 1 tablet (10 mg total) by mouth daily.  90 tablet  3  . aspirin (ANACIN) 81 MG EC tablet Take 81 mg by mouth daily.        . cetirizine (ZYRTEC) 10 MG tablet Take 10 mg by mouth daily.        [name] loperamide  (IMODIUM) 2 MG capsule Take 2 mg by mouth 4 (four) times daily as needed.        . Multiple Vitamin (MULTIVITAMIN) capsule Take 1 capsule by mouth daily.        . OMEGA 3 1000 MG CAPS Take by mouth daily.        . pantoprazole (PROTONIX) 40 MG tablet Take 1 tablet (40 mg total) by mouth daily.  90 tablet  3  . Probiotic Product (PROBIOTIC FORMULA PO) Take by mouth daily.        . solifenacin (VESICARE) 10 MG tablet Take 10 mg by mouth daily.      . valACYclovir (VALTREX) 500 MG tablet Take 500 mg by mouth 3 (three) times daily as needed.       . valsartan (DIOVAN) 80 MG tablet Take 1 tablet (80 mg total) by mouth daily.  90 tablet  3  . vitamin E 400 UNIT capsule Take 400 Units by mouth daily.        . Vitamins-Lipotropics (B-100 COMPLEX) TABS Take by mouth daily.        [name] DISCONTD: rOPINIRole (REQUIP) 1 MG tablet Take 1 tablet (1 mg total) by mouth at bedtime.  90 tablet  3  . DISCONTD: simvastatin (ZOCOR) 40 MG tablet TAKE 1 TABLET DAILY  90 tablet  2  . DISCONTD: testosterone (ANDROGEL) 50 MG/5GM GEL Place 5 g onto the skin daily. 1 pack every other day and alternating with 2 packs every other day  135 Package  1  . DISCONTD: testosterone (ANDROGEL) 50 MG/5GM GEL 1 pack onto skin qod alternating with 2 packs on skin qod  145 Package  3    Current Medications (verified) Current Outpatient Prescriptions  Medication Sig Dispense Refill  . alfuzosin (UROXATRAL) 10 MG 24 hr tablet Take 1 tablet (10 mg total) by mouth daily.  90 tablet  3  . aspirin (ANACIN) 81 MG EC tablet Take 81 mg by mouth daily.        . cetirizine (ZYRTEC) 10 MG tablet Take 10 mg by mouth daily.        [name] loperamide (IMODIUM) 2 MG capsule Take 2 mg by mouth 4 (four) times daily as needed.        . Multiple Vitamin (MULTIVITAMIN) capsule Take 1 capsule by mouth daily.        . OMEGA 3 1000 MG CAPS Take by mouth daily.        . pantoprazole (PROTONIX) 40 MG tablet Take 1 tablet (40 mg total) by mouth daily.  90 tablet  3  .  Probiotic Product (PROBIOTIC FORMULA PO) Take by mouth daily.        [name] rOPINIRole (REQUIP) 1 MG tablet Take 1 tablet (1 mg total) by mouth at bedtime.  90 tablet  3  . simvastatin (ZOCOR) 40 MG tablet Take 1 tablet (40 mg total) by mouth at bedtime.  90 tablet  3  . solifenacin (VESICARE) 10 MG tablet Take 10 mg by mouth daily.      [name] testosterone (ANDROGEL) 50 MG/5GM GEL Place 5 g onto the skin daily. 1 pack  every other day and alternating with 2 packs every other day      . testosterone (ANDROGEL) 50 MG/5GM GEL 1 pack onto skin qod alternating with 2 packs on skin qod  145 Package  1  . valACYclovir (VALTREX) 500 MG tablet Take 500 mg by mouth 3 (three) times daily as needed.       . valsartan (DIOVAN) 80 MG tablet Take 1 tablet (80 mg total) by mouth daily.  90 tablet  3  . vitamin E 400 UNIT capsule Take 400 Units by mouth daily.        . Vitamins-Lipotropics (B-100 COMPLEX) TABS Take by mouth daily.        [name] DISCONTD: rOPINIRole (REQUIP) 1 MG tablet Take 1 tablet (1 mg total) by mouth at bedtime.  90 tablet  3  . DISCONTD: simvastatin (ZOCOR) 40 MG tablet TAKE 1 TABLET DAILY  90 tablet  2  . DISCONTD: testosterone (ANDROGEL) 50 MG/5GM GEL Place 5 g onto the skin daily. 1 pack every other day and alternating with 2 packs every other day  135 Package  1  . DISCONTD: testosterone (ANDROGEL) 50 MG/5GM GEL 1 pack onto skin qod alternating with 2 packs on skin qod  145 Package  3     Allergies (verified) Penicillins   PAST HISTORY  Family History Family History  Problem Relation Age of Onset  . Arthritis Other     Social History History  Substance Use Topics  . Smoking status: Former Smoker    Quit date: 12/26/1978  . Smokeless tobacco: Not on file  . Alcohol Use: Yes    Are there smokers in your home (other than you)?  No  Risk Factors Current exercise habits: The patient does not participate in regular exercise at present.  Dietary issues discussed: Due to weight   Cardiac  risk factors: dyslipidemia, hypertension, male gender and sedentary lifestyle.  Depression Screen (Note: if answer to either of the following is "Yes", a more complete depression screening is indicated)   Q1: Over the past two weeks, have you felt down, depressed or hopeless? No  Q2: Over the past two weeks, have you felt little interest or pleasure in doing things? No  Have you lost interest or pleasure in daily life? No  Do you often feel hopeless? No  Do you cry easily over simple problems? No  Activities of Daily Living In your present state of health, do you have any difficulty performing the following activities?:  Driving? No Managing money?  No Feeding yourself? No Getting from bed to chair? No Climbing a flight of stairs? No Preparing food and eating?: No Bathing or showering? No Getting dressed: No Getting to the toilet? No Using the toilet:No Moving around from place to place: No In the past year have you fallen or had a near fall?:No   Are you sexually active?  Yes  Do you have more than one partner?  Yes  Hearing Difficulties: No Do you often ask people to speak up or repeat themselves? No Do you experience ringing or noises in your ears? No Do you have difficulty understanding soft or whispered voices? No   Do you feel that you have a problem with memory? No  Do you often misplace items? No  Do you feel safe at home?  No  Cognitive Testing  Alert? Yes  Normal Appearance?Yes  Oriented to person? Yes  Place? Yes   Time? Yes  Recall of three objects?  Yes  Can perform simple calculations? No  Displays appropriate judgment?Yes  Can read the correct time from a watch face?Yes   Advanced Directives have been discussed with the patient? Yes   List the Names of Other Physician/Practitioners you currently use: 1.    Indicate any recent Medical Services you may have received from other than [facility] providers in the past year (date may be  approximate).  Immunization History  Administered Date(s) Administered  . Influenza Whole 03/17/2007, 05/31/2008, 03/30/2009, 01/26/2010  . Pneumococcal Polysaccharide 05/27/2006  . Td 05/27/2006    Screening Tests Health Maintenance  Topic Date Due  . Colonoscopy  10/27/1991  . Zostavax  10/26/2001  . Pneumococcal Polysaccharide Vaccine Age 84 And Over  10/27/2006  . Influenza Vaccine  02/25/2012  . Tetanus/tdap  05/27/2016    All answers were reviewed with the patient and necessary referrals were made:  Carrie Mew, MD   12/24/2011   History reviewed: allergies, current medications, past family history, past medical history, past social history, past surgical history and problem list  Review of Systems A comprehensive review of systems was negative except for: Musculoskeletal: positive for back pain    Objective:     Vision by Snellen chart: right eye:20/20, left eye:20/20 Blood pressure 110/74, pulse 84, temperature 98.6 F (37 C), resp. rate 16, height 6' (1.829 m), weight 228 lb (103.42 kg). Body mass index is 30.92 kg/(m^2).  BP 110/74  Pulse 84  Temp 98.6 F (37 C)  Resp 16  Ht 6' (1.829 m)  Wt 228 lb (103.42 kg)  BMI 30.92 kg/m2  General Appearance:    Alert, cooperative, no distress, appears stated age  Head:    Normocephalic, without obvious abnormality, atraumatic  Eyes:    PERRL, conjunctiva/corneas clear, EOM's intact, fundi    benign, both eyes       Ears:    Normal TM's and external ear canals, both ears  Nose:   Nares normal, septum midline, mucosa normal, no drainage    or sinus tenderness  Throat:   Lips, mucosa, and tongue normal; teeth and gums normal  Neck:   Supple, symmetrical, trachea midline, no adenopathy;       thyroid:  No enlargement/tenderness/nodules; no carotid   bruit or JVD  Back:     Symmetric, no curvature, ROM normal, no CVA tenderness  Lungs:     Clear to auscultation bilaterally, respirations unlabored  Chest wall:    No  tenderness or deformity  Heart:    Regular rate and rhythm, S1 and S2 normal, no murmur, rub   or gallop  Abdomen:     Soft, non-tender, bowel sounds active all four quadrants,    no masses, no organomegaly  Genitalia:    Normal male without lesion, discharge or tenderness  Rectal:    Normal tone, normal prostate, no masses or tenderness;   guaiac negative stool  Extremities:   Extremities normal, atraumatic, no cyanosis or edema  Pulses:   2+ and symmetric all extremities  Skin:   Skin color, texture, turgor normal, no rashes or lesions  Lymph nodes:   Cervical, supraclavicular, and axillary nodes normal  Neurologic:   CNII-XII intact. Normal strength, sensation and reflexes      throughout       Assessment:      Patient presents for yearly preventative medicine examination.   all immunizations and health maintenance protocols were reviewed with the patient and they are up to date with these protocols.   screening  laboratory values were reviewed with the patient including screening of hyperlipidemia PSA renal function and hepatic function.   There medications past medical history social history problem list and allergies were reviewed in detail.   Goals were established with regard to weight loss exercise diet in compliance with medications 2     Plan:     During the course of the visit the patient was educated and counseled about appropriate screening and preventive services including:    Influenza vaccine  Prostate cancer screening  Nutrition counseling   Diet review for nutrition referral? Yes ____  Not Indicated ____   Patient Instructions (the written plan) was given to the patient.  Medicare Attestation I have personally reviewed: The patient's medical and social history Their use of alcohol, tobacco or illicit drugs Their current medications and supplements The patient's functional ability including ADLs,fall risks, home safety risks, cognitive, and  hearing and visual impairment Diet and physical activities Evidence for depression or mood disorders  The patient's weight, height, BMI, and visual acuity have been recorded in the chart.  I have made referrals, counseling, and provided education to the patient based on review of the above and I have provided the patient with a written personalized care plan for preventive services.     Carrie Mew, MD   12/24/2011

## 2011-12-24 NOTE — Patient Instructions (Signed)
The patient is instructed to continue all medications as prescribed. Schedule followup with check out clerk upon leaving the clinic  

## 2012-01-02 ENCOUNTER — Telehealth: Payer: Self-pay | Admitting: Internal Medicine

## 2012-01-02 NOTE — Telephone Encounter (Signed)
Per dr Lovell Sheehan- all looks good - with improvement- xrays shows some arthritis-pt informed

## 2012-01-02 NOTE — Telephone Encounter (Signed)
Pt called req xray and lab results. Pls call asap.

## 2012-02-11 DIAGNOSIS — D1801 Hemangioma of skin and subcutaneous tissue: Secondary | ICD-10-CM | POA: Diagnosis not present

## 2012-02-11 DIAGNOSIS — L819 Disorder of pigmentation, unspecified: Secondary | ICD-10-CM | POA: Diagnosis not present

## 2012-02-11 DIAGNOSIS — L578 Other skin changes due to chronic exposure to nonionizing radiation: Secondary | ICD-10-CM | POA: Diagnosis not present

## 2012-02-11 DIAGNOSIS — D2370 Other benign neoplasm of skin of unspecified lower limb, including hip: Secondary | ICD-10-CM | POA: Diagnosis not present

## 2012-02-11 DIAGNOSIS — L821 Other seborrheic keratosis: Secondary | ICD-10-CM | POA: Diagnosis not present

## 2012-02-12 ENCOUNTER — Other Ambulatory Visit: Payer: Self-pay | Admitting: Internal Medicine

## 2012-02-17 DIAGNOSIS — Z23 Encounter for immunization: Secondary | ICD-10-CM | POA: Diagnosis not present

## 2012-03-16 DIAGNOSIS — H31009 Unspecified chorioretinal scars, unspecified eye: Secondary | ICD-10-CM | POA: Diagnosis not present

## 2012-03-16 DIAGNOSIS — H259 Unspecified age-related cataract: Secondary | ICD-10-CM | POA: Diagnosis not present

## 2012-03-16 DIAGNOSIS — H04129 Dry eye syndrome of unspecified lacrimal gland: Secondary | ICD-10-CM | POA: Diagnosis not present

## 2012-03-16 DIAGNOSIS — H43819 Vitreous degeneration, unspecified eye: Secondary | ICD-10-CM | POA: Diagnosis not present

## 2012-04-24 ENCOUNTER — Other Ambulatory Visit: Payer: Self-pay | Admitting: Internal Medicine

## 2012-06-25 ENCOUNTER — Ambulatory Visit: Payer: Medicare Other | Admitting: Internal Medicine

## 2012-08-10 ENCOUNTER — Other Ambulatory Visit: Payer: Self-pay | Admitting: Internal Medicine

## 2012-08-10 ENCOUNTER — Other Ambulatory Visit: Payer: Self-pay | Admitting: *Deleted

## 2012-08-10 MED ORDER — TEMAZEPAM 30 MG PO CAPS: 30.0000 mg | ORAL_CAPSULE | Freq: Every evening | ORAL | Status: DC | PRN

## 2012-08-10 MED ORDER — TESTOSTERONE 50 MG/5GM (1%) TD GEL: TRANSDERMAL | Status: DC

## 2012-08-13 ENCOUNTER — Telehealth: Payer: Self-pay | Admitting: Internal Medicine

## 2012-08-13 NOTE — Telephone Encounter (Signed)
When i called they stated it had been taken care of

## 2012-08-13 NOTE — Telephone Encounter (Signed)
Pharmacist called and stated that the quantity of pt's testosterone (ANDROGEL) 50 MG/5GM GEL RX, comes in 150. The RX was written for 145. Reference number for this case is [id number]. Please assist.

## 2012-08-24 ENCOUNTER — Encounter: Payer: Self-pay | Admitting: Internal Medicine

## 2012-08-24 ENCOUNTER — Ambulatory Visit (INDEPENDENT_AMBULATORY_CARE_PROVIDER_SITE_OTHER): Payer: Medicare Other | Admitting: Internal Medicine

## 2012-08-24 VITALS — BP 130/80 | HR 72 | Temp 98.2°F | Resp 16 | Ht 72.0 in | Wt 238.0 lb

## 2012-08-24 DIAGNOSIS — M549 Dorsalgia, unspecified: Secondary | ICD-10-CM | POA: Diagnosis not present

## 2012-08-24 DIAGNOSIS — I10 Essential (primary) hypertension: Secondary | ICD-10-CM | POA: Diagnosis not present

## 2012-08-24 DIAGNOSIS — Z23 Encounter for immunization: Secondary | ICD-10-CM

## 2012-08-24 NOTE — Addendum Note (Signed)
Addended by: Willy Eddy on: 08/24/2012 12:35 PM   Modules accepted: Orders

## 2012-08-24 NOTE — Progress Notes (Signed)
Subjective:    Patient ID: Alex Kemp, male    DOB: [birth year], 72 y.o.   MRN: [id number]  HPI Weight  Issues HTN and lipid follow up Discussed dental surgery  Review of Systems  Constitutional: Negative for fever and fatigue.  HENT: Negative for hearing loss, congestion, neck pain and postnasal drip.   Eyes: Negative for discharge, redness and visual disturbance.  Respiratory: Negative for cough, shortness of breath and wheezing.   Cardiovascular: Negative for leg swelling.  Gastrointestinal: Negative for abdominal pain, constipation and abdominal distention.  Genitourinary: Negative for urgency and frequency.  Musculoskeletal: Negative for joint swelling and arthralgias.  Skin: Negative for color change and rash.  Neurological: Negative for weakness and light-headedness.  Hematological: Negative for adenopathy.  Psychiatric/Behavioral: Negative for behavioral problems.   Past Medical History  Diagnosis Date  . GERD (gastroesophageal reflux disease)   . Headache   . Hyperlipidemia   . Insomnia   . Hypertension   . Corneal injury   . Hx of colonic polyps     History   Social History  . Marital Status: Married    Spouse Name: N/A    Number of Children: N/A  . Years of Education: N/A   Occupational History  . Not on file.   Social History Main Topics  . Smoking status: Former Smoker    Quit date: 12/26/1978  . Smokeless tobacco: Not on file  . Alcohol Use: Yes  . Drug Use: No  . Sexually Active: Yes   Other Topics Concern  . Not on file   Social History Narrative  . No narrative on file    Past Surgical History  Procedure Laterality Date  . Panedoscopy  04/23/1999  . Lt are fracture    . Rt shoulder surgery    . Nasal sinus surgery    . Corneal injury    . Refreactory surgery to eyes    . Dental plate      Family History  Problem Relation Age of Onset  . Arthritis Other     Allergies  Allergen Reactions  . Penicillins     REACTION: ?  childhood    Current Outpatient Prescriptions on File Prior to Visit  Medication Sig Dispense Refill  . alfuzosin (UROXATRAL) 10 MG 24 hr tablet Take 1 tablet (10 mg total) by mouth daily.  90 tablet  3  . aspirin (ANACIN) 81 MG EC tablet Take 81 mg by mouth daily.        . cetirizine (ZYRTEC) 10 MG tablet Take 10 mg by mouth daily.        [name] DIOVAN 80 MG tablet TAKE 1 TABLET DAILY  90 tablet  3  . loperamide (IMODIUM) 2 MG capsule Take 2 mg by mouth 4 (four) times daily as needed.        . Multiple Vitamin (MULTIVITAMIN) capsule Take 1 capsule by mouth daily.        . OMEGA 3 1000 MG CAPS Take by mouth daily.        . pantoprazole (PROTONIX) 40 MG tablet TAKE 1 TABLET DAILY  90 tablet  3  . Probiotic Product (PROBIOTIC FORMULA PO) Take by mouth daily.        [name] rOPINIRole (REQUIP) 1 MG tablet Take 1 tablet (1 mg total) by mouth at bedtime.  90 tablet  3  . simvastatin (ZOCOR) 40 MG tablet Take 1 tablet (40 mg total) by mouth at bedtime.  90 tablet  3  .  temazepam (RESTORIL) 30 MG capsule Take 1 capsule (30 mg total) by mouth at bedtime as needed for sleep.  90 capsule  0  . testosterone (ANDROGEL) 50 MG/5GM GEL 1 pack onto skin qod alternating with 2 packs on skin qod  145 Package  1  . valACYclovir (VALTREX) 500 MG tablet Take 500 mg by mouth 3 (three) times daily as needed.       . VESICARE 10 MG tablet TAKE 1 TABLET DAILY  90 tablet  3  . vitamin E 400 UNIT capsule Take 400 Units by mouth daily.        . Vitamins-Lipotropics (B-100 COMPLEX) TABS Take by mouth daily.         No current facility-administered medications on file prior to visit.    BP 130/80  Pulse 72  Temp(Src) 98.2 F (36.8 C)  Resp 16  Ht 6' (1.829 m)  Wt 238 lb (107.956 kg)  BMI 32.27 kg/m2       Objective:   Physical Exam  Nursing note and vitals reviewed. Constitutional: He appears well-developed and well-nourished.  HENT:  Head: Normocephalic and atraumatic.  Eyes: Conjunctivae are normal. Pupils are  equal, round, and reactive to light.  Neck: Normal range of motion. Neck supple.  Cardiovascular: Normal rate and regular rhythm.   Pulmonary/Chest: Effort normal and breath sounds normal.  Abdominal: Soft. Bowel sounds are normal.          Assessment & Plan:  Stable HTN Lower back pain with spondylosis  Prostate vesicare and uroxitrol Set up welnesspnemovaccine

## 2012-08-24 NOTE — Patient Instructions (Signed)
Back Exercises Back exercises help treat and prevent back injuries. The goal of back exercises is to increase the strength of your abdominal and back muscles and the flexibility of your back. These exercises should be started when you no longer have back pain. Back exercises include:  Pelvic Tilt. Lie on your back with your knees bent. Tilt your pelvis until the lower part of your back is against the floor. Hold this position 5 to 10 sec and repeat 5 to 10 times.  Knee to Chest. Pull first 1 knee up against your chest and hold for 20 to 30 seconds, repeat this with the other knee, and then both knees. This may be done with the other leg straight or bent, whichever feels better.  Sit-Ups or Curl-Ups. Bend your knees 90 degrees. Start with tilting your pelvis, and do a partial, slow sit-up, lifting your trunk only 30 to 45 degrees off the floor. Take at least 2 to 3 seconds for each sit-up. Do not do sit-ups with your knees out straight. If partial sit-ups are difficult, simply do the above but with only tightening your abdominal muscles and holding it as directed.  Hip-Lift. Lie on your back with your knees flexed 90 degrees. Push down with your feet and shoulders as you raise your hips a couple inches off the floor; hold for 10 seconds, repeat 5 to 10 times.  Back arches. Lie on your stomach, propping yourself up on bent elbows. Slowly press on your hands, causing an arch in your low back. Repeat 3 to 5 times. Any initial stiffness and discomfort should lessen with repetition over time.  Shoulder-Lifts. Lie face down with arms beside your body. Keep hips and torso pressed to floor as you slowly lift your head and shoulders off the floor. Do not overdo your exercises, especially in the beginning. Exercises may cause you some mild back discomfort which lasts for a few minutes; however, if the pain is more severe, or lasts for more than 15 minutes, do not continue exercises until you see your caregiver.  Improvement with exercise therapy for back problems is slow.  See your caregivers for assistance with developing a proper back exercise program. Document Released: 06/20/2004 Document Revised: 08/05/2011 Document Reviewed: 03/14/2011 [employer] Patient Information 2013 [employer]  

## 2012-09-09 DIAGNOSIS — E291 Testicular hypofunction: Secondary | ICD-10-CM | POA: Diagnosis not present

## 2012-09-09 DIAGNOSIS — N400 Enlarged prostate without lower urinary tract symptoms: Secondary | ICD-10-CM | POA: Diagnosis not present

## 2012-09-09 DIAGNOSIS — N529 Male erectile dysfunction, unspecified: Secondary | ICD-10-CM | POA: Diagnosis not present

## 2012-09-09 DIAGNOSIS — R350 Frequency of micturition: Secondary | ICD-10-CM | POA: Diagnosis not present

## 2012-10-23 ENCOUNTER — Telehealth: Payer: Self-pay | Admitting: Internal Medicine

## 2012-10-23 NOTE — Telephone Encounter (Signed)
Pt has to be in court 8/1 and need to resch.  First available, 11/3.  Pt would like to ask if he cannot be seen anytime soon, who would you recommend for him to switch to?

## 2012-10-23 NOTE — Telephone Encounter (Signed)
Per dr Leretha Pol- if he wants to stay  here , he would suggest dr k ,but if he would like to go to [place], he can go to dr Yetta Barre

## 2012-10-29 ENCOUNTER — Telehealth: Payer: Self-pay | Admitting: Internal Medicine

## 2012-10-29 ENCOUNTER — Other Ambulatory Visit: Payer: Self-pay | Admitting: *Deleted

## 2012-10-29 MED ORDER — TEMAZEPAM 30 MG PO CAPS: 30.0000 mg | ORAL_CAPSULE | Freq: Every evening | ORAL | Status: DC | PRN

## 2012-10-29 NOTE — Telephone Encounter (Signed)
Dee from [facility] pharmacy called and stated that they needed to speak with you regarding the pt's temazepam (RESTORIL) 30 MG capsule RX. She stated that the instructions conflict with the quantity. The reference number for this case is [id number]. Please assist.

## 2012-10-30 NOTE — Telephone Encounter (Signed)
done

## 2012-11-25 NOTE — Telephone Encounter (Signed)
Court has been canceled, pt will make appt.

## 2012-12-25 ENCOUNTER — Encounter: Payer: Self-pay | Admitting: Internal Medicine

## 2012-12-25 ENCOUNTER — Ambulatory Visit (INDEPENDENT_AMBULATORY_CARE_PROVIDER_SITE_OTHER): Payer: Medicare Other | Admitting: Internal Medicine

## 2012-12-25 VITALS — BP 152/80 | HR 72 | Temp 98.2°F | Resp 16 | Ht 72.0 in | Wt 246.0 lb

## 2012-12-25 DIAGNOSIS — E785 Hyperlipidemia, unspecified: Secondary | ICD-10-CM

## 2012-12-25 DIAGNOSIS — I10 Essential (primary) hypertension: Secondary | ICD-10-CM | POA: Diagnosis not present

## 2012-12-25 DIAGNOSIS — R972 Elevated prostate specific antigen [PSA]: Secondary | ICD-10-CM | POA: Diagnosis not present

## 2012-12-25 DIAGNOSIS — E291 Testicular hypofunction: Secondary | ICD-10-CM

## 2012-12-25 DIAGNOSIS — Z0000 Encounter for general adult medical examination without abnormal findings: Secondary | ICD-10-CM | POA: Diagnosis not present

## 2012-12-25 DIAGNOSIS — K219 Gastro-esophageal reflux disease without esophagitis: Secondary | ICD-10-CM | POA: Diagnosis not present

## 2012-12-25 LAB — CBC WITH DIFFERENTIAL/PLATELET
Basophils Relative: 0.5 % (ref 0.0–3.0)
HCT: 43.5 % (ref 39.0–52.0)
Hemoglobin: 14.7 g/dL (ref 13.0–17.0)
Lymphocytes Relative: 29 % (ref 12.0–46.0)
Lymphs Abs: 2 10*3/uL (ref 0.7–4.0)
Monocytes Relative: 8.2 % (ref 3.0–12.0)
Neutro Abs: 4.1 10*3/uL (ref 1.4–7.7)
RBC: 4.81 Mil/uL (ref 4.22–5.81)
RDW: 14.1 % (ref 11.5–14.6)

## 2012-12-25 LAB — HEPATIC FUNCTION PANEL
ALT: 30 U/L (ref 0–53)
AST: 24 U/L (ref 0–37)
Bilirubin, Direct: 0.1 mg/dL (ref 0.0–0.3)
Total Bilirubin: 0.9 mg/dL (ref 0.3–1.2)
Total Protein: 6.8 g/dL (ref 6.0–8.3)

## 2012-12-25 LAB — BASIC METABOLIC PANEL
BUN: 14 mg/dL (ref 6–23)
CO2: 29 mEq/L (ref 19–32)
Chloride: 108 mEq/L (ref 96–112)
Creatinine, Ser: 1.2 mg/dL (ref 0.4–1.5)
Potassium: 4.3 mEq/L (ref 3.5–5.1)

## 2012-12-25 LAB — LIPID PANEL
Cholesterol: 172 mg/dL (ref 0–200)
LDL Cholesterol: 98 mg/dL (ref 0–99)
Total CHOL/HDL Ratio: 3
Triglycerides: 84 mg/dL (ref 0.0–149.0)

## 2012-12-25 LAB — TESTOSTERONE: Testosterone: 126.57 ng/dL — ABNORMAL LOW (ref 350.00–890.00)

## 2012-12-25 NOTE — Progress Notes (Signed)
Subjective:    Patient ID: Alex Kemp, male    DOB: 01-11-1941, 72 y.o.   MRN: [id number]  HPI Yearly exam Followed  For HTN, Lipids and elevated PSA Medicare wellness exam   Review of Systems  Constitutional: Negative for fever and fatigue.  HENT: Negative for hearing loss, congestion, neck pain and postnasal drip.   Eyes: Negative for discharge, redness and visual disturbance.  Respiratory: Negative for cough, shortness of breath and wheezing.   Cardiovascular: Negative for leg swelling.  Gastrointestinal: Negative for abdominal pain, constipation and abdominal distention.  Genitourinary: Negative for urgency and frequency.  Musculoskeletal: Negative for joint swelling and arthralgias.  Skin: Negative for color change and rash.  Neurological: Negative for weakness and light-headedness.  Hematological: Negative for adenopathy.  Psychiatric/Behavioral: Negative for behavioral problems.       Objective:   Physical Exam  Nursing note reviewed. Constitutional: He is oriented to person, place, and time. He appears well-developed and well-nourished.  HENT:  Head: Normocephalic and atraumatic.  Eyes: Conjunctivae are normal. Pupils are equal, round, and reactive to light.  Neck: Normal range of motion. Neck supple.  Cardiovascular: Normal rate, regular rhythm and intact distal pulses.   Pulmonary/Chest: Effort normal and breath sounds normal.  Abdominal: Soft. Bowel sounds are normal.  Genitourinary:  BPH  Neurological: He is alert and oriented to person, place, and time.  Skin: Skin is warm and dry.  Psychiatric: He has a normal mood and affect. His behavior is normal.          Assessment & Plan:  Sulfa hypertension and hyperlipidemia we will do a basic metabolic panel a liver function and a lipid panel today.  Patient has a history of an elevated prostate-specific antigen therefore we will draw a PSA today.  The patientis on testosterone replacement in level should be  monitored for appropriate.  For potential side effects of drugs his CBC should be obtained  Medicare wellness Subjective:    Alex Kemp is a 72 y.o. male who presents for Medicare Annual/Subsequent preventive examination.   Preventive Screening-Counseling & Management  Tobacco History  Smoking status  . Former Smoker  . Quit date: 12/26/1978  Smokeless tobacco  . Not on file    Problems Prior to Visit 1.   Current Problems (verified) Patient Active Problem List   Diagnosis Date Noted  . Possible exposure to STD 12/17/2010  . Diarrhea 08/21/2010  . 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 INSOMNIA 10/09/2009  . ELEVATED PROSTATE SPECIFIC ANTIGEN 10/02/2009  . HERPES LABIALIS 04/04/2009  . INSECT BITE 09/28/2007  . ERECTILE DYSFUNCTION 09/23/2007  . COLONIC POLYPS, HX OF 09/23/2007  . HYPERTENSION 12/17/2006  . COLONIC POLYPS, ADENOMATOUS, BENIGN 12/17/2006  . HYPERLIPIDEMIA 11/20/2006  . GERD 11/20/2006  . HEADACHE 11/20/2006    Medications Prior to Visit Current Outpatient Prescriptions on File Prior to Visit  Medication Sig Dispense Refill  . alfuzosin (UROXATRAL) 10 MG 24 hr tablet Take 1 tablet (10 mg total) by mouth daily.  90 tablet  3  . aspirin (ANACIN) 81 MG EC tablet Take 81 mg by mouth daily.        . cetirizine (ZYRTEC) 10 MG tablet Take 10 mg by mouth daily.        [name] DIOVAN 80 MG tablet TAKE 1 TABLET DAILY  90 tablet  3  . loperamide (IMODIUM) 2 MG capsule Take 2 mg by mouth 4 (four) times daily as needed.        [name]  Multiple Vitamin (MULTIVITAMIN) capsule Take 1 capsule by mouth daily.        . OMEGA 3 1000 MG CAPS Take by mouth daily.        . pantoprazole (PROTONIX) 40 MG tablet TAKE 1 TABLET DAILY  90 tablet  3  . Probiotic Product (PROBIOTIC FORMULA PO) Take by mouth daily.        [name] rOPINIRole (REQUIP) 1 MG tablet Take 1 tablet (1 mg total) by mouth at bedtime.  90 tablet  3  . simvastatin (ZOCOR) 40 MG tablet  Take 1 tablet (40 mg total) by mouth at bedtime.  90 tablet  3  . temazepam (RESTORIL) 30 MG capsule Take 1 capsule (30 mg total) by mouth at bedtime as needed for sleep.  90 capsule  1  . testosterone (ANDROGEL) 50 MG/5GM GEL 1 pack onto skin qod alternating with 2 packs on skin qod  145 Package  1  . valACYclovir (VALTREX) 500 MG tablet Take 500 mg by mouth 3 (three) times daily as needed.       . VESICARE 10 MG tablet TAKE 1 TABLET DAILY  90 tablet  3  . vitamin E 400 UNIT capsule Take 400 Units by mouth daily.        . Vitamins-Lipotropics (B-100 COMPLEX) TABS Take by mouth daily.         No current facility-administered medications on file prior to visit.    Current Medications (verified) Current Outpatient Prescriptions  Medication Sig Dispense Refill  . alfuzosin (UROXATRAL) 10 MG 24 hr tablet Take 1 tablet (10 mg total) by mouth daily.  90 tablet  3  . aspirin (ANACIN) 81 MG EC tablet Take 81 mg by mouth daily.        . cetirizine (ZYRTEC) 10 MG tablet Take 10 mg by mouth daily.        [name] DIOVAN 80 MG tablet TAKE 1 TABLET DAILY  90 tablet  3  . loperamide (IMODIUM) 2 MG capsule Take 2 mg by mouth 4 (four) times daily as needed.        . Multiple Vitamin (MULTIVITAMIN) capsule Take 1 capsule by mouth daily.        . OMEGA 3 1000 MG CAPS Take by mouth daily.        . pantoprazole (PROTONIX) 40 MG tablet TAKE 1 TABLET DAILY  90 tablet  3  . Probiotic Product (PROBIOTIC FORMULA PO) Take by mouth daily.        [name] rOPINIRole (REQUIP) 1 MG tablet Take 1 tablet (1 mg total) by mouth at bedtime.  90 tablet  3  . simvastatin (ZOCOR) 40 MG tablet Take 1 tablet (40 mg total) by mouth at bedtime.  90 tablet  3  . temazepam (RESTORIL) 30 MG capsule Take 1 capsule (30 mg total) by mouth at bedtime as needed for sleep.  90 capsule  1  . testosterone (ANDROGEL) 50 MG/5GM GEL 1 pack onto skin qod alternating with 2 packs on skin qod  145 Package  1  . valACYclovir (VALTREX) 500 MG tablet Take 500 mg  by mouth 3 (three) times daily as needed.       . VESICARE 10 MG tablet TAKE 1 TABLET DAILY  90 tablet  3  . vitamin E 400 UNIT capsule Take 400 Units by mouth daily.        . Vitamins-Lipotropics (B-100 COMPLEX) TABS Take by mouth daily.         No current  facility-administered medications for this visit.     Allergies (verified) Penicillins   PAST HISTORY  Family History Family History  Problem Relation Age of Onset  . Arthritis Other     Social History History  Substance Use Topics  . Smoking status: Former Smoker    Quit date: 12/26/1978  . Smokeless tobacco: Not on file  . Alcohol Use: Yes    Are there smokers in your home (other than you)?  No  Risk Factors Current exercise habits: The patient does not participate in regular exercise at present.   Dietary issues discussed: weight loss   Cardiac risk factors: diabetes mellitus, hypertension and sedentary lifestyle.  Depression Screen (Note: if answer to either of the following is "Yes", a more complete depression screening is indicated)   Q1: Over the past two weeks, have you felt down, depressed or hopeless? No  Q2: Over the past two weeks, have you felt little interest or pleasure in doing things? No  Have you lost interest or pleasure in daily life? No  Do you often feel hopeless? No  Do you cry easily over simple problems? No  Activities of Daily Living In your present state of health, do you have any difficulty performing the following activities?:  Driving? No Managing money?  No Feeding yourself? No Getting from bed to chair? No Climbing a flight of stairs? No Preparing food and eating?: No Bathing or showering? No Getting dressed: No Getting to the toilet? No Using the toilet:No Moving around from place to place: No In the past year have you fallen or had a near fall?:No   Are you sexually active?  Yes  Do you have more than one partner?  No  Hearing Difficulties: No Do you often ask people  to speak up or repeat themselves? No Do you experience ringing or noises in your ears? No Do you have difficulty understanding soft or whispered voices? No   Do you feel that you have a problem with memory? No  Do you often misplace items? No  Do you feel safe at home?  No  Cognitive Testing  Alert? Yes  Normal Appearance?Yes  Oriented to person? Yes  Place? Yes   Time? Yes  Recall of three objects?  Yes  Can perform simple calculations? Yes  Displays appropriate judgment?Yes  Can read the correct time from a watch face?Yes   Advanced Directives have been discussed with the patient? Yes   List the Names of Other Physician/Practitioners you currently use: 1.    Indicate any recent Medical Services you may have received from other than [facility] providers in the past year (date may be approximate).  Immunization History  Administered Date(s) Administered  . Influenza Split 02/17/2012  . Influenza Whole 03/17/2007, 05/31/2008, 03/30/2009, 01/26/2010  . Pneumococcal Polysaccharide 05/27/2006, 08/24/2012  . Td 05/27/2006    Screening Tests Health Maintenance  Topic Date Due  . Colonoscopy  10/27/1991  . Zostavax  10/26/2001  . Influenza Vaccine  01/25/2013  . Tetanus/tdap  05/27/2016  . Pneumococcal Polysaccharide Vaccine Age 65 And Over  Completed    All answers were reviewed with the patient and necessary referrals were made:  Carrie Mew, MD   12/25/2012   History reviewed: allergies, current medications, past family history, past medical history, past social history, past surgical history and problem list  Review of Systems Pertinent items are noted in HPI.    Objective:     Vision by Snellen chart: right eye:20/20, left eye:20/20 Blood  pressure 152/80, pulse 72, temperature 98.2 F (36.8 C), resp. rate 16, height 6' (1.829 m), weight 246 lb (111.585 kg). Body mass index is 33.36 kg/(m^2).  BP 152/80  Pulse 72  Temp(Src) 98.2 F (36.8 C)  Resp 16  Ht  6' (1.829 m)  Wt 246 lb (111.585 kg)  BMI 33.36 kg/m2  General Appearance:    Alert, cooperative, no distress, appears stated age  Head:    Normocephalic, without obvious abnormality, atraumatic  Eyes:    PERRL, conjunctiva/corneas clear, EOM's intact, fundi    benign, both eyes       Ears:    Normal TM's and external ear canals, both ears  Nose:   Nares normal, septum midline, mucosa normal, no drainage    or sinus tenderness  Throat:   Lips, mucosa, and tongue normal; teeth and gums normal  Neck:   Supple, symmetrical, trachea midline, no adenopathy;       thyroid:  No enlargement/tenderness/nodules; no carotid   bruit or JVD  Back:     Symmetric, no curvature, ROM normal, no CVA tenderness  Lungs:     Clear to auscultation bilaterally, respirations unlabored  Chest wall:    No tenderness or deformity  Heart:    Regular rate and rhythm, S1 and S2 normal, no murmur, rub   or gallop  Abdomen:     Soft, non-tender, bowel sounds active all four quadrants,    no masses, no organomegaly  Genitalia:    Normal male without lesion, discharge or tenderness  Rectal:    Normal tone, normal prostate, no masses or tenderness;   guaiac negative stool  Extremities:   Extremities normal, atraumatic, no cyanosis or edema  Pulses:   2+ and symmetric all extremities  Skin:   Skin color, texture, turgor normal, no rashes or lesions  Lymph nodes:   Cervical, supraclavicular, and axillary nodes normal  Neurologic:   CNII-XII intact. Normal strength, sensation and reflexes      throughout       Assessment:      Patient presents for yearly preventative medicine examination.   all immunizations and health maintenance protocols were reviewed with the patient and they are up to date with these protocols.   screening laboratory values were reviewed with the patient including screening of hyperlipidemia PSA renal function and hepatic function.   There medications past medical history social history  problem list and allergies were reviewed in detail.   Goals were established with regard to weight loss exercise diet in compliance with medications      Plan:     During the course of the visit the patient was educated and counseled about appropriate screening and preventive services including:    Pneumococcal vaccine   Influenza vaccine  Prostate cancer screening  Diet review for nutrition referral? Yes ____  Not Indicated ____   Patient Instructions (the written plan) was given to the patient.  Medicare Attestation I have personally reviewed: The patient's medical and social history Their use of alcohol, tobacco or illicit drugs Their current medications and supplements The patient's functional ability including ADLs,fall risks, home safety risks, cognitive, and hearing and visual impairment Diet and physical activities Evidence for depression or mood disorders  The patient's weight, height, BMI, and visual acuity have been recorded in the chart.  I have made referrals, counseling, and provided education to the patient based on review of the above and I have provided the patient with a written personalized care plan  for preventive services.     Carrie Mew, MD   12/25/2012

## 2013-01-26 ENCOUNTER — Other Ambulatory Visit: Payer: Self-pay | Admitting: Internal Medicine

## 2013-02-09 DIAGNOSIS — L570 Actinic keratosis: Secondary | ICD-10-CM | POA: Diagnosis not present

## 2013-02-09 DIAGNOSIS — L821 Other seborrheic keratosis: Secondary | ICD-10-CM | POA: Diagnosis not present

## 2013-02-09 DIAGNOSIS — Z85828 Personal history of other malignant neoplasm of skin: Secondary | ICD-10-CM | POA: Diagnosis not present

## 2013-02-09 DIAGNOSIS — D1801 Hemangioma of skin and subcutaneous tissue: Secondary | ICD-10-CM | POA: Diagnosis not present

## 2013-02-09 DIAGNOSIS — L819 Disorder of pigmentation, unspecified: Secondary | ICD-10-CM | POA: Diagnosis not present

## 2013-02-16 ENCOUNTER — Other Ambulatory Visit: Payer: Self-pay | Admitting: *Deleted

## 2013-02-16 MED ORDER — PANTOPRAZOLE SODIUM 40 MG PO TBEC: DELAYED_RELEASE_TABLET | ORAL | Status: DC

## 2013-02-24 DIAGNOSIS — Z23 Encounter for immunization: Secondary | ICD-10-CM | POA: Diagnosis not present

## 2013-02-25 ENCOUNTER — Other Ambulatory Visit: Payer: Self-pay | Admitting: *Deleted

## 2013-03-10 DIAGNOSIS — R972 Elevated prostate specific antigen [PSA]: Secondary | ICD-10-CM | POA: Diagnosis not present

## 2013-03-16 ENCOUNTER — Other Ambulatory Visit: Payer: Self-pay | Admitting: *Deleted

## 2013-03-16 MED ORDER — TESTOSTERONE 50 MG/5GM (1%) TD GEL: TRANSDERMAL | Status: DC

## 2013-03-29 ENCOUNTER — Other Ambulatory Visit: Payer: Self-pay | Admitting: *Deleted

## 2013-03-29 MED ORDER — ROPINIROLE HCL 1 MG PO TABS: 1.0000 mg | ORAL_TABLET | Freq: Every day | ORAL | Status: DC

## 2013-04-05 DIAGNOSIS — H43819 Vitreous degeneration, unspecified eye: Secondary | ICD-10-CM | POA: Diagnosis not present

## 2013-04-05 DIAGNOSIS — H04129 Dry eye syndrome of unspecified lacrimal gland: Secondary | ICD-10-CM | POA: Diagnosis not present

## 2013-04-05 DIAGNOSIS — H259 Unspecified age-related cataract: Secondary | ICD-10-CM | POA: Diagnosis not present

## 2013-05-14 ENCOUNTER — Telehealth: Payer: Self-pay | Admitting: Internal Medicine

## 2013-05-14 ENCOUNTER — Other Ambulatory Visit: Payer: Self-pay | Admitting: *Deleted

## 2013-05-14 MED ORDER — TEMAZEPAM 30 MG PO CAPS: 30.0000 mg | ORAL_CAPSULE | Freq: Every evening | ORAL | Status: DC | PRN

## 2013-05-14 NOTE — Telephone Encounter (Signed)
Pt is stating that his temazepam (RESTORIL) 30 MG capsule has not been called into [facility].  Pt needs this as soon as possible, he only has a few pills left.  Pt also stated that you can call it into his [facility] pharmacy if necessary. Please advise.

## 2013-05-14 NOTE — Telephone Encounter (Signed)
Printed script and sent to [facility]

## 2013-05-24 ENCOUNTER — Telehealth: Payer: Self-pay | Admitting: Internal Medicine

## 2013-05-24 NOTE — Telephone Encounter (Signed)
Patient Information:  Caller Name: [name]  Phone: [phone]  Patient: Alex Kemp  Gender: Male  DOB: Aug 01, 1940  Age: 72 Years  PCP: Darryll Capers (Adults only)  Office Follow Up:  Does the office need to follow up with this patient?: No  Instructions For The Office: N/A  RN Note:  All appointments are full; may send to Urgent Care per message from office this date.  Symptoms  Reason For Call & Symptoms: Patient calling about weakness and limping/tenderness in left foot.  He reports he can put weight on his heel but when he puts pressure on the ball of his foot it gives away.   Tingling in right hand; he can hold objects in it, but describes an unsual sensation in it.   Onset estimated approximately one month, not evaluated.  Cough onset estimated 05/03/13; has improved.  Emergent symptoms ruled out.  Go to Office Now per Neuerologic Deficit guideline due to Neurologic deficit of gradual onset, any of the following:  Weakness of the face, arm or leg on one side of the body, Numbness of the face, arm or leg on one side of the body Loss of speech or gargled speech.  Reviewed Health History In EMR: Yes  Reviewed Medications In EMR: Yes  Reviewed Allergies In EMR: Yes  Reviewed Surgeries / Procedures: Yes  Date of Onset of Symptoms: 05/03/2013  Guideline(s) Used:  Neurologic Deficit  Disposition Per Guideline:   Go to Office Now  Reason For Disposition Reached:   Neurologic deficit of gradual onset, ANY of the following:   Weakness of the face, arm, or leg on one side of the body  Numbness of the face, arm, or leg on one side of the body  Loss of speech or garbled speech  Advice Given:  N/A  RN Overrode Recommendation:  Go To [facility]  All appointments are full; may send to Urgent Care per message from office.

## 2013-05-24 NOTE — Telephone Encounter (Signed)
[place]/Patient Phone: [phone] called regarding severe weakness/fatigue and cough.  No answer at time of call back.  Left message to call back if still needs assistance.

## 2013-05-25 ENCOUNTER — Encounter (HOSPITAL_COMMUNITY): Payer: Self-pay | Admitting: Emergency Medicine

## 2013-05-25 ENCOUNTER — Emergency Department (INDEPENDENT_AMBULATORY_CARE_PROVIDER_SITE_OTHER)
Admission: EM | Admit: 2013-05-25 | Discharge: 2013-05-25 | Disposition: A | Discharge status: Nursing Home (Includes ICF) | Payer: Medicare Other | Source: Home / Self Care | Attending: Emergency Medicine | Admitting: Emergency Medicine

## 2013-05-25 ENCOUNTER — Emergency Department (HOSPITAL_COMMUNITY): Payer: Medicare Other

## 2013-05-25 ENCOUNTER — Telehealth: Payer: Self-pay | Admitting: Internal Medicine

## 2013-05-25 ENCOUNTER — Emergency Department (HOSPITAL_COMMUNITY)
Admission: EM | Admit: 2013-05-25 | Discharge: 2013-05-25 | Disposition: A | Discharge status: Home / Self Care | Payer: Medicare Other | Attending: Emergency Medicine | Admitting: Emergency Medicine

## 2013-05-25 DIAGNOSIS — R0602 Shortness of breath: Secondary | ICD-10-CM

## 2013-05-25 DIAGNOSIS — Z860100 Personal history of colon polyps, unspecified: Secondary | ICD-10-CM | POA: Insufficient documentation

## 2013-05-25 DIAGNOSIS — R5381 Other malaise: Secondary | ICD-10-CM

## 2013-05-25 DIAGNOSIS — G4700 Insomnia, unspecified: Secondary | ICD-10-CM | POA: Diagnosis not present

## 2013-05-25 DIAGNOSIS — J40 Bronchitis, not specified as acute or chronic: Secondary | ICD-10-CM

## 2013-05-25 DIAGNOSIS — Z79899 Other long term (current) drug therapy: Secondary | ICD-10-CM | POA: Diagnosis not present

## 2013-05-25 DIAGNOSIS — Z7982 Long term (current) use of aspirin: Secondary | ICD-10-CM | POA: Diagnosis not present

## 2013-05-25 DIAGNOSIS — E785 Hyperlipidemia, unspecified: Secondary | ICD-10-CM | POA: Insufficient documentation

## 2013-05-25 DIAGNOSIS — I10 Essential (primary) hypertension: Secondary | ICD-10-CM | POA: Diagnosis not present

## 2013-05-25 DIAGNOSIS — Z87891 Personal history of nicotine dependence: Secondary | ICD-10-CM | POA: Diagnosis not present

## 2013-05-25 DIAGNOSIS — R200 Anesthesia of skin: Secondary | ICD-10-CM

## 2013-05-25 DIAGNOSIS — Z880 Allergy status to penicillin: Secondary | ICD-10-CM | POA: Diagnosis not present

## 2013-05-25 DIAGNOSIS — R5383 Other fatigue: Secondary | ICD-10-CM

## 2013-05-25 DIAGNOSIS — R531 Weakness: Secondary | ICD-10-CM

## 2013-05-25 DIAGNOSIS — R209 Unspecified disturbances of skin sensation: Secondary | ICD-10-CM

## 2013-05-25 DIAGNOSIS — J209 Acute bronchitis, unspecified: Secondary | ICD-10-CM | POA: Insufficient documentation

## 2013-05-25 DIAGNOSIS — R29818 Other symptoms and signs involving the nervous system: Secondary | ICD-10-CM | POA: Diagnosis not present

## 2013-05-25 DIAGNOSIS — R05 Cough: Secondary | ICD-10-CM | POA: Diagnosis not present

## 2013-05-25 DIAGNOSIS — K219 Gastro-esophageal reflux disease without esophagitis: Secondary | ICD-10-CM | POA: Insufficient documentation

## 2013-05-25 DIAGNOSIS — R059 Cough, unspecified: Secondary | ICD-10-CM | POA: Diagnosis not present

## 2013-05-25 LAB — DIFFERENTIAL
Basophils Absolute: 0 10*3/uL (ref 0.0–0.1)
Basophils Relative: 0 % (ref 0–1)
Eosinophils Absolute: 0.2 10*3/uL (ref 0.0–0.7)
Eosinophils Relative: 3 % (ref 0–5)
Lymphocytes Relative: 29 % (ref 12–46)
Monocytes Absolute: 0.6 10*3/uL (ref 0.1–1.0)
Monocytes Relative: 7 % (ref 3–12)
Neutrophils Relative %: 61 % (ref 43–77)

## 2013-05-25 LAB — COMPREHENSIVE METABOLIC PANEL
ALT: 26 U/L (ref 0–53)
Albumin: 4.2 g/dL (ref 3.5–5.2)
Alkaline Phosphatase: 118 U/L — ABNORMAL HIGH (ref 39–117)
BUN: 13 mg/dL (ref 6–23)
Calcium: 9.2 mg/dL (ref 8.4–10.5)
Potassium: 4.6 mEq/L (ref 3.7–5.3)
Sodium: 142 mEq/L (ref 137–147)
Total Bilirubin: 0.5 mg/dL (ref 0.3–1.2)
Total Protein: 7.8 g/dL (ref 6.0–8.3)

## 2013-05-25 LAB — CBC
MCH: 31.8 pg (ref 26.0–34.0)
MCHC: 35.6 g/dL (ref 30.0–36.0)
Platelets: 217 10*3/uL (ref 150–400)
RDW: 13.3 % (ref 11.5–15.5)
WBC: 8.3 10*3/uL (ref 4.0–10.5)

## 2013-05-25 LAB — PROTIME-INR
INR: 0.94 (ref 0.00–1.49)
Prothrombin Time: 12.4 seconds (ref 11.6–15.2)

## 2013-05-25 LAB — GLUCOSE, CAPILLARY: Glucose-Capillary: 89 mg/dL (ref 70–99)

## 2013-05-25 LAB — TROPONIN I: Troponin I: <0.3 ng/mL (ref <0.30)

## 2013-05-25 LAB — POCT I-STAT TROPONIN I

## 2013-05-25 MED ORDER — AZITHROMYCIN 250 MG PO TABS: 250.0000 mg | ORAL_TABLET | Freq: Every day | ORAL | Status: DC

## 2013-05-25 NOTE — ED Provider Notes (Signed)
CSN: [id number]     Arrival date & time 05/25/13  0908 History   First MD Initiated Contact with Patient 05/25/13 1136     Chief Complaint  Patient presents with  . Cough  . Fatigue  . Extremity Weakness   (Consider location/radiation/quality/duration/timing/severity/associated sxs/prior Treatment) HPI Comments: 72 year old male presents complaining of weakness, fatigue, shortness of breath on exertion, and a tingling sensation in the right half of his body. This started one month ago with mild symptoms of weakness, fatigue, and tingling as well as cough and runny nose. The cough and runny nose have mostly subsided, but the weakness and numbness/tingling sensation have gotten worse. The shortness of breath on exertion has gotten worse over the past 2 weeks as well. He now cannot take a full shower without having to stop and rest. He has no history of similar symptoms. He stopped smoking in 1980 but he did smoke for about 30 years. He has a history of a positive stress test 10 years ago.  Patient is a 72 y.o. male presenting with cough and extremity weakness.  Cough Associated symptoms: shortness of breath   Associated symptoms: no chest pain, no chills, no fever, no myalgias, no rash and no sore throat   Extremity Weakness Associated symptoms include shortness of breath. Pertinent negatives include no chest pain and no abdominal pain.    Past Medical History  Diagnosis Date  . GERD (gastroesophageal reflux disease)   . Headache(784.0)   . Hyperlipidemia   . Insomnia   . Hypertension   . Corneal injury   . Hx of colonic polyps    Past Surgical History  Procedure Laterality Date  . Panedoscopy  04/23/1999  . Lt are fracture    . Rt shoulder surgery    . Nasal sinus surgery    . Corneal injury    . Refreactory surgery to eyes    . Dental plate     Family History  Problem Relation Age of Onset  . Arthritis Other    History  Substance Use Topics  . Smoking status: Former  Smoker    Quit date: 12/26/1978  . Smokeless tobacco: Not on file  . Alcohol Use: Yes    Review of Systems  Constitutional: Positive for fatigue. Negative for fever and chills.  HENT: Negative for sore throat.   Eyes: Negative for visual disturbance.  Respiratory: Positive for cough and shortness of breath.   Cardiovascular: Negative for chest pain, palpitations and leg swelling.  Gastrointestinal: Negative for nausea, vomiting, abdominal pain, diarrhea and constipation.  Genitourinary: Negative for dysuria, urgency, frequency and hematuria.  Musculoskeletal: Positive for extremity weakness. Negative for arthralgias, myalgias, neck pain and neck stiffness.  Skin: Negative for rash.  Neurological: Positive for weakness, light-headedness and numbness. Negative for dizziness.    Allergies  Penicillins  Home Medications   Current Outpatient Rx  Name  Route  Sig  Dispense  Refill  . aspirin (ANACIN) 81 MG EC tablet   Oral   Take 81 mg by mouth daily.           . cetirizine (ZYRTEC) 10 MG tablet   Oral   Take 10 mg by mouth daily.           [name] DIOVAN 80 MG tablet      TAKE 1 TABLET DAILY   90 tablet   3   . Multiple Vitamin (MULTIVITAMIN) capsule   Oral   Take 1 capsule by mouth daily.           [name]  OMEGA 3 1000 MG CAPS   Oral   Take by mouth daily.           . pantoprazole (PROTONIX) 40 MG tablet      TAKE 1 TABLET DAILY   90 tablet   3   . rOPINIRole (REQUIP) 1 MG tablet   Oral   Take 1 tablet (1 mg total) by mouth at bedtime.   90 tablet   1   . simvastatin (ZOCOR) 40 MG tablet      TAKE 1 TABLET AT BEDTIME   90 tablet   3   . temazepam (RESTORIL) 30 MG capsule   Oral   Take 1 capsule (30 mg total) by mouth at bedtime as needed for sleep.   90 capsule   1   . alfuzosin (UROXATRAL) 10 MG 24 hr tablet   Oral   Take 1 tablet (10 mg total) by mouth daily.   90 tablet   3   . loperamide (IMODIUM) 2 MG capsule   Oral   Take 2 mg by mouth 4  (four) times daily as needed.           . Probiotic Product (PROBIOTIC FORMULA PO)   Oral   Take by mouth daily.           [name] testosterone (ANDROGEL) 50 MG/5GM GEL      1 pack onto skin qod alternating with 2 packs on skin qod   145 Package   1   . valACYclovir (VALTREX) 500 MG tablet   Oral   Take 500 mg by mouth 3 (three) times daily as needed.          . VESICARE 10 MG tablet      TAKE 1 TABLET DAILY   90 tablet   3   . vitamin E 400 UNIT capsule   Oral   Take 400 Units by mouth daily.           . Vitamins-Lipotropics (B-100 COMPLEX) TABS   Oral   Take by mouth daily.            BP 152/87  Pulse 77  Temp(Src) 97.6 F (36.4 C) (Oral)  Resp 20  SpO2 100% Physical Exam  Nursing note and vitals reviewed. Constitutional: He is oriented to person, place, and time. He appears well-developed and well-nourished. No distress.  HENT:  Head: Normocephalic and atraumatic.  Eyes: Conjunctivae and EOM are normal. Pupils are equal, round, and reactive to light. Right eye exhibits no discharge. Left eye exhibits no discharge. No scleral icterus.  Neck: Normal range of motion. Neck supple. No JVD present. No tracheal deviation present.  Cardiovascular: Normal rate and regular rhythm.  Exam reveals distant heart sounds. Exam reveals no gallop, no S3, no S4 and no friction rub.   No murmur heard. Pulses:      Radial pulses are 1+ on the right side, and 2+ on the left side.       Dorsalis pedis pulses are 0 on the right side, and 0 on the left side.       Posterior tibial pulses are 1+ on the right side, and 1+ on the left side.  Pulmonary/Chest: Effort normal and breath sounds normal. No respiratory distress. He has no wheezes. He has no rales.  Lymphadenopathy:    He has no cervical adenopathy.  Neurological: He is alert and oriented to person, place, and time. He has normal strength and normal reflexes. He is not disoriented.  No cranial nerve deficit. He exhibits normal  muscle tone. GCS eye subscore is 4. GCS verbal subscore is 5. GCS motor subscore is 6.  Skin: Skin is warm and dry. No rash noted. He is not diaphoretic.  Psychiatric: He has a normal mood and affect. Judgment normal.    ED Course  Procedures (including critical care time) Labs Review Labs Reviewed - No data to display Imaging Review No results found.    MDM   1. Weakness   2. SOBOE (shortness of breath on exertion)   3. Numbness and tingling    With the history and physical exam findings, differential includes remote CVA, intracranial mass, thoracic aortic dissection. Patient is being transferred to the ED for further evaluation. This presentation is not acute with symptoms gradually worsening over one month and only moderate at this time, transferring via shuttle.      Graylon Good, PA-C 05/25/13 1218

## 2013-05-25 NOTE — Telephone Encounter (Signed)
Dr Jonny Ruiz, Pt would like to know if you would accept him as a new pt/transfer. 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 Is that ok w/ you dr Lovell Sheehan?

## 2013-05-25 NOTE — Telephone Encounter (Signed)
It is ok with dr Lovell Sheehan

## 2013-05-25 NOTE — ED Notes (Signed)
Reports symptoms started with a cough and fatigue. Off/on 4 wks ago.  Pt states that cough has subsided but is still having extreme fatigue and is experiencing weakness of the right side of the body.  Pt states that he had an episode at work today where he got really hot and dizzy  And weakness in right arm feels a little worse.  Sob with exertion.  Denies chest pain.

## 2013-05-25 NOTE — ED Notes (Signed)
MD at bedside. 

## 2013-05-25 NOTE — ED Notes (Signed)
Patient transported to MRI 

## 2013-05-25 NOTE — ED Notes (Signed)
Pt c/o SOB and right sided weakness x 1 month; pt sts cough x 2 months; pt sent for further eval from [facility]

## 2013-05-25 NOTE — ED Provider Notes (Signed)
Medical screening examination/treatment/procedure(s) were performed by non-physician practitioner and as supervising physician I was immediately available for consultation/collaboration.  Leslee Home, M.D.  Reuben Likes, MD 05/25/13 [phone]

## 2013-05-25 NOTE — ED Provider Notes (Signed)
CSN: [id number]     Arrival date & time 05/25/13  1222 History   First MD Initiated Contact with Patient 05/25/13 1550     Chief Complaint  Patient presents with  . Weakness   (Consider location/radiation/quality/duration/timing/severity/associated sxs/prior Treatment) HPI Comments: Patient presents to the ER with multiple complaints. Patient reports that he started to feel poorly approximately one month ago. One of his coworkers was sick with "walking pneumonia" at that time. Patient developed cough, congestion. His cough has improved, but since then he has had progressively worsening generalized weakness. In addition, patient has noticed for approximately the same amount of time that he has been experiencing weakness on the right side. He reports his right arm and his right leg have been known and tingly at times and his coworkers have noticed that he is "stumbling around". He reports that he has noticed that he is having trouble walking because of weakness in the leg. He has not had any headache. No lower back pain. No change in bowel or bladder function.  Patient is a 72 y.o. male presenting with weakness.  Weakness    Past Medical History  Diagnosis Date  . GERD (gastroesophageal reflux disease)   . Headache(784.0)   . Hyperlipidemia   . Insomnia   . Hypertension   . Corneal injury   . Hx of colonic polyps    Past Surgical History  Procedure Laterality Date  . Panedoscopy  04/23/1999  . Lt are fracture    . Rt shoulder surgery    . Nasal sinus surgery    . Corneal injury    . Refreactory surgery to eyes    . Dental plate     Family History  Problem Relation Age of Onset  . Arthritis Other    History  Substance Use Topics  . Smoking status: Former Smoker    Quit date: 12/26/1978  . Smokeless tobacco: Not on file  . Alcohol Use: Yes    Review of Systems  Constitutional: Positive for fatigue. Negative for fever.  Respiratory: Positive for cough.   Neurological:  Positive for weakness (RIGHT SIDE) and numbness.  All other systems reviewed and are negative.    Allergies  Penicillins  Home Medications   Current Outpatient Rx  Name  Route  Sig  Dispense  Refill  . alfuzosin (UROXATRAL) 10 MG 24 hr tablet   Oral   Take 1 tablet (10 mg total) by mouth daily.   90 tablet   3   . aspirin (ANACIN) 81 MG EC tablet   Oral   Take 81 mg by mouth daily.           . cetirizine (ZYRTEC) 10 MG tablet   Oral   Take 10 mg by mouth daily.           [name] DIOVAN 80 MG tablet      TAKE 1 TABLET DAILY   90 tablet   3   . loperamide (IMODIUM) 2 MG capsule   Oral   Take 2 mg by mouth 4 (four) times daily as needed.           . Multiple Vitamin (MULTIVITAMIN) capsule   Oral   Take 1 capsule by mouth daily.           . OMEGA 3 1000 MG CAPS   Oral   Take by mouth daily.           . pantoprazole (PROTONIX) 40 MG tablet  TAKE 1 TABLET DAILY   90 tablet   3   . Probiotic Product (PROBIOTIC FORMULA PO)   Oral   Take by mouth daily.           [name] rOPINIRole (REQUIP) 1 MG tablet   Oral   Take 1 tablet (1 mg total) by mouth at bedtime.   90 tablet   1   . simvastatin (ZOCOR) 40 MG tablet      TAKE 1 TABLET AT BEDTIME   90 tablet   3   . temazepam (RESTORIL) 30 MG capsule   Oral   Take 1 capsule (30 mg total) by mouth at bedtime as needed for sleep.   90 capsule   1   . testosterone (ANDROGEL) 50 MG/5GM GEL      1 pack onto skin qod alternating with 2 packs on skin qod   145 Package   1   . valACYclovir (VALTREX) 500 MG tablet   Oral   Take 500 mg by mouth 3 (three) times daily as needed.          . VESICARE 10 MG tablet      TAKE 1 TABLET DAILY   90 tablet   3   . vitamin E 400 UNIT capsule   Oral   Take 400 Units by mouth daily.           . Vitamins-Lipotropics (B-100 COMPLEX) TABS   Oral   Take by mouth daily.            BP 171/88  Pulse 83  Temp(Src) 97.6 F (36.4 C) (Oral)  Resp 20  Ht 5'  11" (1.803 m)  Wt 244 lb 3.2 oz (110.768 kg)  BMI 34.07 kg/m2  SpO2 99% Physical Exam  Constitutional: He is oriented to person, place, and time. He appears well-developed and well-nourished. No distress.  HENT:  Head: Normocephalic and atraumatic.  Right Ear: Hearing normal.  Left Ear: Hearing normal.  Nose: Nose normal.  Mouth/Throat: Oropharynx is clear and moist and mucous membranes are normal.  Eyes: Conjunctivae and EOM are normal. Pupils are equal, round, and reactive to light.  Neck: Normal range of motion. Neck supple.  Cardiovascular: Regular rhythm, S1 normal and S2 normal.  Exam reveals no gallop and no friction rub.   No murmur heard. Pulmonary/Chest: Effort normal and breath sounds normal. No respiratory distress. He exhibits no tenderness.  Abdominal: Soft. Normal appearance and bowel sounds are normal. There is no hepatosplenomegaly. There is no tenderness. There is no rebound, no guarding, no tenderness at McBurney's point and negative Murphy's sign. No hernia.  Musculoskeletal: Normal range of motion.  Neurological: He is alert and oriented to person, place, and time. He has normal strength. No cranial nerve deficit or sensory deficit. Coordination normal. GCS eye subscore is 4. GCS verbal subscore is 5. GCS motor subscore is 6.  No pronator drift Normal finger to nose Normal heel to shin  Skin: Skin is warm, dry and intact. No rash noted. No cyanosis.  Psychiatric: He has a normal mood and affect. His speech is normal and behavior is normal. Thought content normal.    ED Course  Procedures (including critical care time) Labs Review Labs Reviewed  COMPREHENSIVE METABOLIC PANEL - Abnormal; Notable for the following:    Alkaline Phosphatase 118 (*)    GFR calc non Af Amer 53 (*)    GFR calc Af Amer 62 (*)    All other components within normal limits  PROTIME-INR  APTT  CBC  DIFFERENTIAL  TROPONIN I  GLUCOSE, CAPILLARY  POCT I-STAT TROPONIN I   Imaging  Review Dg Chest 2 View  05/25/2013   CLINICAL DATA:  72 year old male with shortness of breath and cough  EXAM: CHEST  2 VIEW  COMPARISON:  06/19/2011  FINDINGS: The cardiomediastinal silhouette is unremarkable.  There is no evidence of focal airspace disease, pulmonary edema, suspicious pulmonary nodule/mass, pleural effusion, or pneumothorax. No acute bony abnormalities are identified.  IMPRESSION: No evidence of active cardiopulmonary disease.   Electronically Signed   By: Laveda Abbe M.D.   On: 05/25/2013 16:31   Ct Head (brain) Wo Contrast  05/25/2013   CLINICAL DATA:  Right-sided weakness and numbness.  EXAM: CT HEAD WITHOUT CONTRAST  TECHNIQUE: Contiguous axial images were obtained from the base of the skull through the vertex without intravenous contrast.  COMPARISON:  None.  FINDINGS: The brain does not show accelerated atrophy. There is no evidence of old or acute focal infarction, mass lesion, hemorrhage, hydrocephalus or extra-axial collection. Sinuses are clear. No calvarial abnormality.  IMPRESSION: Normal head CT.   Electronically Signed   By: Paulina Fusi M.D.   On: 05/25/2013 13:13   Mr Brain Wo Contrast  05/25/2013   CLINICAL DATA:  Episode of right-sided weakness. Hyperlipidemia and hypertension.  EXAM: MRI HEAD WITHOUT CONTRAST  TECHNIQUE: Multiplanar, multiecho pulse sequences of the brain and surrounding structures were obtained without intravenous contrast.  COMPARISON:  05/25/2013 CT.  No comparison MR.  FINDINGS: No acute infarct.  No intracranial hemorrhage.  No hydrocephalus.  No intracranial mass lesion noted on this unenhanced exam.  Major intracranial vascular structures are patent.  Small pituitary gland possibly an incidental finding.  Cervical medullary junction, pineal region and orbital structures unremarkable.  Minimal ethmoid sinus air cell mucosal thickening.  IMPRESSION: No acute infarct.  Please see above.   Electronically Signed   By: Bridgett Larsson M.D.   On:  05/25/2013 18:08    EKG Interpretation   None       MDM  Diagnosis: 1. Bronchitis   Patient presents to ER with multiple complaints. Patient has had upper respiratory infection symptoms for several weeks. He is having persistent cough, although improved from his original onset. Patient now feeling more generalized weakness. He has perceived right-sided weakness and numbness intermittently, comes to the ER for further evaluation. His neurologic examination is normal. Actually no neurologic deficit. CT head was unremarkable. Patient underwent MRI of brain to rule out acute, subacute and old stroke. No abnormality is seen. No evidence of old or new stroke.    Gilda Crease, MD 05/25/13 [phone]

## 2013-05-25 NOTE — Telephone Encounter (Signed)
Ok with me 

## 2013-05-30 ENCOUNTER — Other Ambulatory Visit: Payer: Self-pay | Admitting: Internal Medicine

## 2013-05-31 NOTE — Telephone Encounter (Signed)
lmom for pt to cb

## 2013-06-18 ENCOUNTER — Ambulatory Visit (INDEPENDENT_AMBULATORY_CARE_PROVIDER_SITE_OTHER): Payer: Medicare Other | Admitting: Internal Medicine

## 2013-06-18 ENCOUNTER — Encounter: Payer: Self-pay | Admitting: Internal Medicine

## 2013-06-18 VITALS — BP 118/78 | HR 78 | Temp 97.0°F | Ht 72.0 in | Wt 249.0 lb

## 2013-06-18 DIAGNOSIS — I10 Essential (primary) hypertension: Secondary | ICD-10-CM | POA: Diagnosis not present

## 2013-06-18 DIAGNOSIS — Z23 Encounter for immunization: Secondary | ICD-10-CM | POA: Diagnosis not present

## 2013-06-18 DIAGNOSIS — E785 Hyperlipidemia, unspecified: Secondary | ICD-10-CM

## 2013-06-18 DIAGNOSIS — M519 Unspecified thoracic, thoracolumbar and lumbosacral intervertebral disc disorder: Secondary | ICD-10-CM | POA: Insufficient documentation

## 2013-06-18 DIAGNOSIS — J209 Acute bronchitis, unspecified: Secondary | ICD-10-CM

## 2013-06-18 DIAGNOSIS — Z0000 Encounter for general adult medical examination without abnormal findings: Secondary | ICD-10-CM | POA: Insufficient documentation

## 2013-06-18 IMAGING — CR DG LUMBAR SPINE COMPLETE 4+V
5 series · 5 of 5 positions shown · non-contrast
Comparison: None.

CLINICAL DATA: Chronic right-sided back and leg pain.  No injury.

LUMBAR SPINE - COMPLETE 4+ VIEW

[view not recorded (1 of 5)]
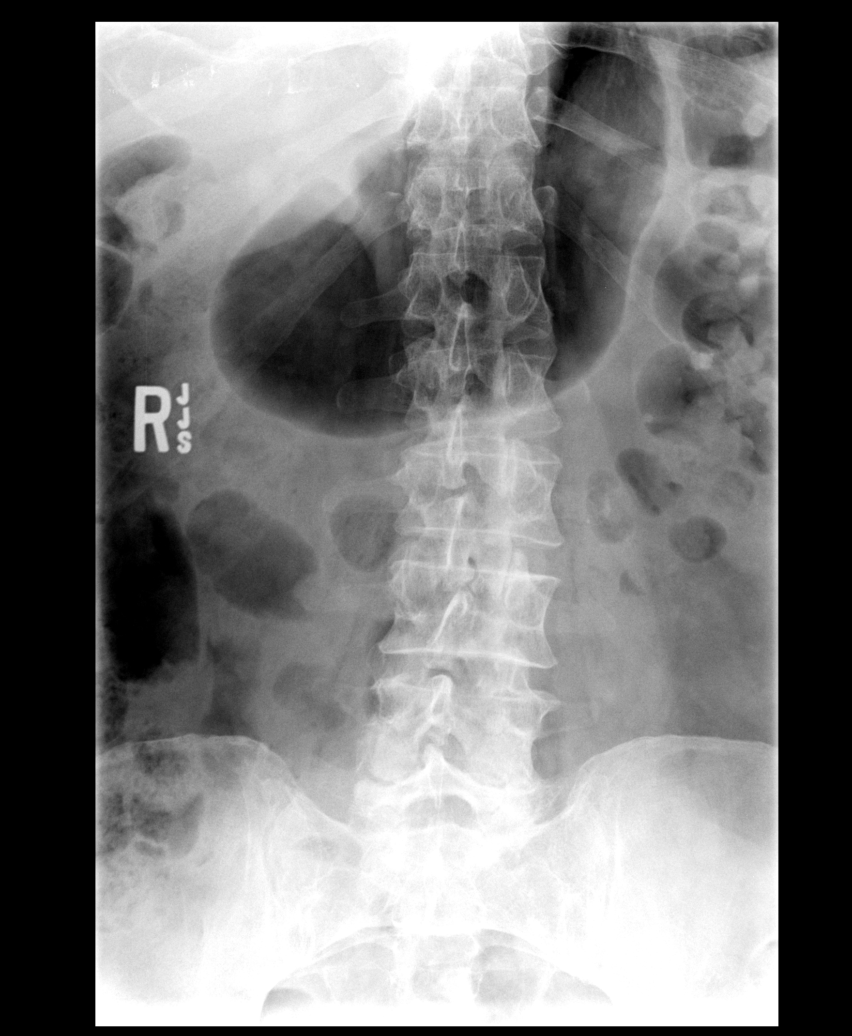

[view not recorded (2 of 5)]
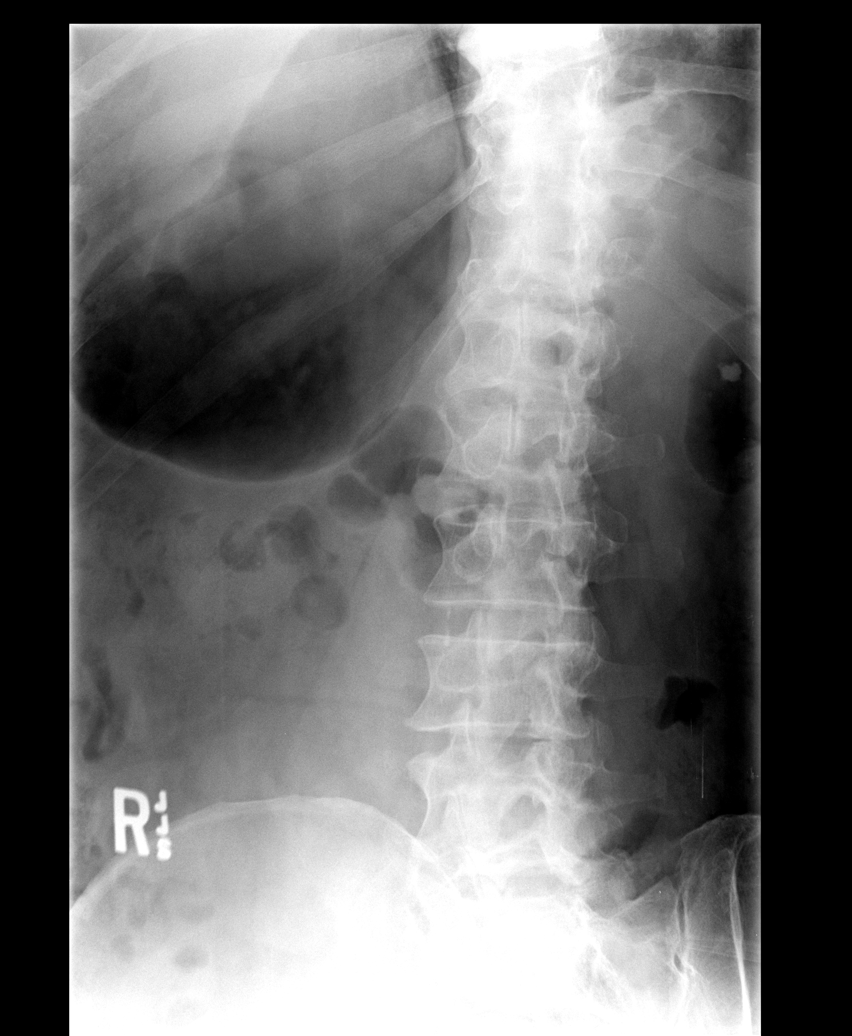

[view not recorded (3 of 5)]
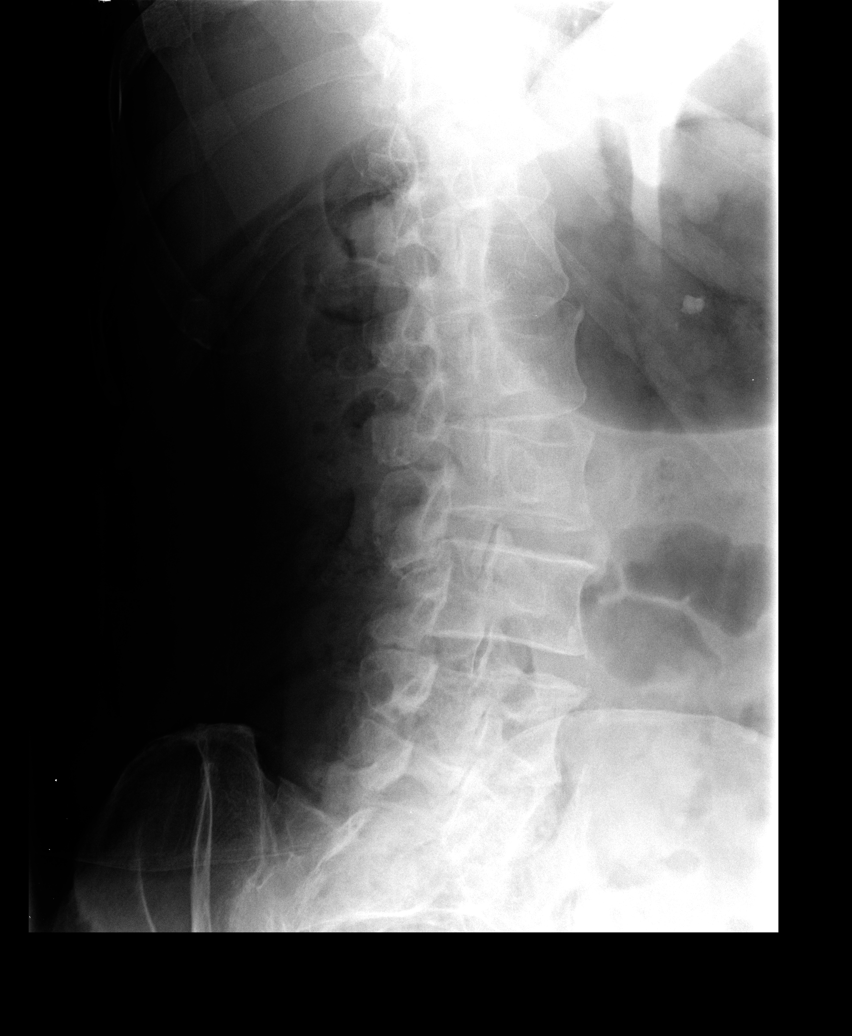

[view not recorded (4 of 5)]
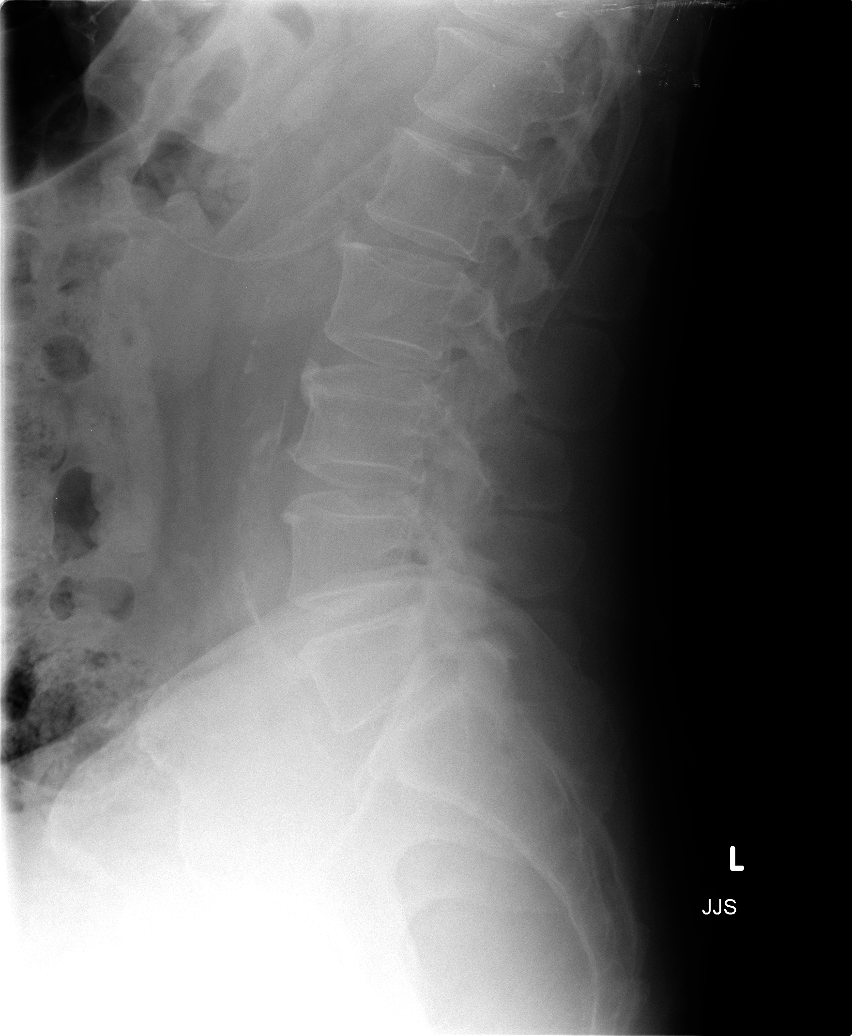

[view not recorded (5 of 5)]
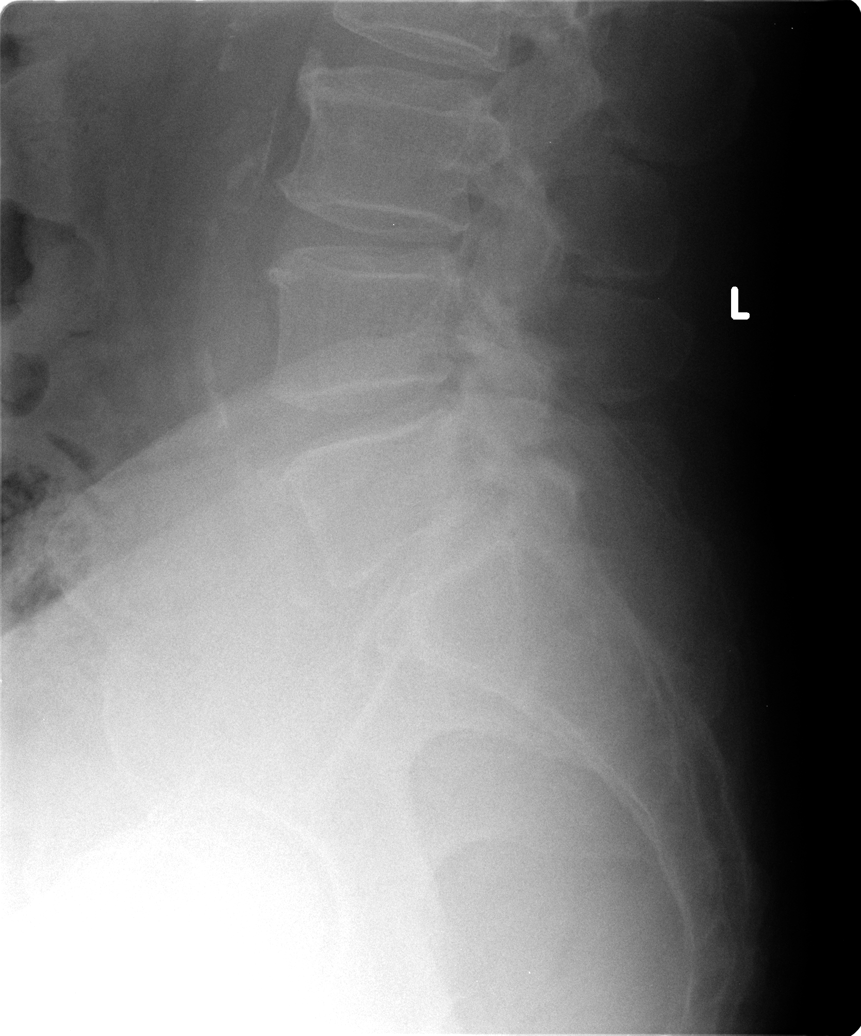

[5 of 5 positions shown; findings below may reference images not displayed]

FINDINGS: Five lumbar type vertebral bodies.  Sacroiliac joints are
symmetric.  Possible left-sided renal calculi.  Mild lower lumbar
spondylosis with end plate osteophytes superiorly at L3 and L4.
Facet arthropathy L4-S1. Maintenance of vertebral body height and
alignment.

Aortic atherosclerosis.  Incidental note is made of a gas-filled
stomach.  Borderline distended.
IMPRESSION: 1.  Lumbar spondylosis, without acute finding.
2.  Cannot exclude left-sided renal calculi.
3.  Borderline distended gas filled stomach.  Correlate with
gastrointestinal complaints.

## 2013-06-18 MED ORDER — SOLIFENACIN SUCCINATE 10 MG PO TABS: 10.0000 mg | ORAL_TABLET | Freq: Every day | ORAL | Status: DC

## 2013-06-18 MED ORDER — ALFUZOSIN HCL ER 10 MG PO TB24: 10.0000 mg | ORAL_TABLET | Freq: Every evening | ORAL | Status: DC

## 2013-06-18 MED ORDER — VALSARTAN 80 MG PO TABS: 80.0000 mg | ORAL_TABLET | Freq: Every day | ORAL | Status: DC

## 2013-06-18 MED ORDER — PANTOPRAZOLE SODIUM 40 MG PO TBEC: 40.0000 mg | DELAYED_RELEASE_TABLET | Freq: Every day | ORAL | Status: DC

## 2013-06-18 MED ORDER — SIMVASTATIN 40 MG PO TABS: 40.0000 mg | ORAL_TABLET | Freq: Every evening | ORAL | Status: DC

## 2013-06-18 NOTE — Addendum Note (Signed)
Addended by: Sharon Seller B on: 06/18/2013 02:12 PM   Modules accepted: Orders

## 2013-06-18 NOTE — Patient Instructions (Addendum)
You had the new Prevnar pneumonia shot today Please continue all other medications as before, and refills have been done if requested. Please have the pharmacy call with any other refills you may need. Please continue your efforts at being more active, low cholesterol diet, and weight control. You are otherwise up to date with prevention measures today.  Please remember to sign up for My Chart if you have not done so, as this will be important to you in the future with finding out test results, communicating by private email, and scheduling acute appointments online when needed.  Please return in August 2015, or sooner if needed

## 2013-06-18 NOTE — Assessment & Plan Note (Signed)
stable overall by history and exam, recent data reviewed with pt, and pt to continue medical treatment as before,  to f/u any worsening symptoms or concerns BP Readings from Last 3 Encounters:  06/18/13 118/78  05/25/13 171/72  05/25/13 152/87

## 2013-06-18 NOTE — Assessment & Plan Note (Signed)
Improved, reassured, no further tx needed

## 2013-06-18 NOTE — Progress Notes (Signed)
Pre-visit discussion using our clinic review tool. No additional management support is needed unless otherwise documented below in the visit note.  

## 2013-06-18 NOTE — Assessment & Plan Note (Signed)
stable overall by history and exam, recent data reviewed with pt, and pt to continue medical treatment as before,  to f/u any worsening symptoms or concerns \ Lab Results  Component Value Date   LDLCALC 98 12/25/2012

## 2013-06-18 NOTE — Progress Notes (Signed)
Subjective:    Patient ID: Alex Kemp, male    DOB: 02/21/1941, 73 y.o.   MRN: [id number]  HPI   Here in transfer of care, had a recent resp illness and fatigue, and for some reason some increased sens of right calf and ankle, still limps a bit even now.  In fact has chronic back pain, not clear if worse in past 3-4 wks, does Am stretching excercises that helps prevent back spasms as well.  No clear radicular symptoms now, and no bowel or bladder change, fever, wt loss,  worsening LE pain/numbness/weakness, gait change or recent falls with injury.    Also menitons chronic SOB, had a stress test several yrs ago apparently neg for ischemia, no more recent wheezing, CP or fluid, but still some congestion.  Sees Dr Evans/urology with [facility] psa, s/p prostate biopsy neg approx 3 yrs, more recently improving psa, last to 4.06 approx 5 mo ago. Still working as Optician, dispensing for representing persons contacted by the [employer] Past Medical History  Diagnosis Date  . GERD (gastroesophageal reflux disease)   . Headache(784.0)   . Hyperlipidemia   . Insomnia   . Hypertension   . Corneal injury   . Hx of colonic polyps    Past Surgical History  Procedure Laterality Date  . Panedoscopy  04/23/1999  . Lt are fracture    . Rt shoulder surgery    . Nasal sinus surgery    . Corneal injury    . Refreactory surgery to eyes    . Dental plate    . Penile prosthesis implant      reports that he quit smoking about 34 years ago. He does not have any smokeless tobacco history on file. He reports that he drinks alcohol. He reports that he does not use illicit drugs. family history includes Arthritis in his other. Allergies  Allergen Reactions  . Penicillins Other (See Comments)    Reaction as a 64 or 73 year old   Current Outpatient Prescriptions on File Prior to Visit  Medication Sig Dispense Refill  . aspirin EC 81 MG tablet Take 81 mg by mouth daily with lunch.      [name] azithromycin (ZITHROMAX) 250 MG tablet  Take 1 tablet (250 mg total) by mouth daily. Take first 2 tablets together, then 1 every day until finished.  6 tablet  0  . b complex vitamins tablet Take 1 tablet by mouth daily with lunch.      . CETIRIZINE HCL PO Take 1 tablet by mouth every evening.      . Coenzyme Q10 (COQ10 PO) Take 1 tablet by mouth daily with lunch.      . Glucosamine HCl (GLUCOSAMINE PO) Take 1 tablet by mouth daily with lunch.      . loperamide (IMODIUM) 2 MG capsule Take 2 mg by mouth 2 (two) times daily as needed for diarrhea or loose stools.       . Multiple Vitamin (MULTIVITAMIN WITH MINERALS) TABS tablet Take 1 tablet by mouth daily with lunch.      . Omega-3 Fatty Acids (FISH OIL PO) Take 1 capsule by mouth daily with lunch.      . Polyvinyl Alcohol-Povidone (REFRESH OP) Place 1 drop into both eyes 2 (two) times daily.      . Probiotic Product (PROBIOTIC FORMULA PO) Take 1 capsule by mouth daily with lunch.       [name] rOPINIRole (REQUIP) 1 MG tablet Take 1 tablet (1 mg total)  by mouth at bedtime.  90 tablet  1  . temazepam (RESTORIL) 30 MG capsule Take 30 mg by mouth at bedtime.      [name] testosterone (ANDROGEL) 50 MG/5GM GEL Place 5-10 g onto the skin daily. Alternate every other day 1 packet (5 g) 1st day, 2 packets (10 g) 2nd day      . valsartan (DIOVAN) 80 MG tablet TAKE 1 TABLET DAILY  90 tablet  3   No current facility-administered medications on file prior to visit.   Review of Systems  Constitutional: Negative for unexpected weight change, or unusual diaphoresis  HENT: Negative for tinnitus.   Eyes: Negative for photophobia and visual disturbance.  Respiratory: Negative for choking and stridor.   Gastrointestinal: Negative for vomiting and blood in stool.  Genitourinary: Negative for hematuria and decreased urine volume.  Musculoskeletal: Negative for acute joint swelling Skin: Negative for color change and wound.  Neurological: Negative for tremors and numbness other than noted  Psychiatric/Behavioral:  Negative for decreased concentration or  hyperactivity.       Objective:   Physical Exam BP 118/78  Pulse 78  Temp(Src) 97 F (36.1 C) (Oral)  Ht 6' (1.829 m)  Wt 249 lb (112.946 kg)  BMI 33.76 kg/m2  SpO2 97% VS noted,  Constitutional: Pt appears well-developed and well-nourished./obese  HENT: Head: NCAT.  Right Ear: External ear normal.  Left Ear: External ear normal.  Eyes: Conjunctivae and EOM are normal. Pupils are equal, round, and reactive to light.  Neck: Normal range of motion. Neck supple.  Cardiovascular: Normal rate and regular rhythm.   Pulmonary/Chest: Effort normal and breath sounds normal.  Abd:  Soft, NT, non-distended, + BS Neurological: Pt is alert. Not confused  Skin: Skin is warm. No erythema.  Psychiatric: Pt behavior is normal. Thought content normal.         Assessment & Plan:

## 2013-06-21 ENCOUNTER — Telehealth: Payer: Self-pay | Admitting: Internal Medicine

## 2013-06-21 NOTE — Telephone Encounter (Signed)
Relevant patient education assigned to patient using Emmi. ° °

## 2013-06-28 ENCOUNTER — Ambulatory Visit: Payer: Medicare Other | Admitting: Internal Medicine

## 2013-08-08 ENCOUNTER — Other Ambulatory Visit: Payer: Self-pay | Admitting: Internal Medicine

## 2013-09-08 DIAGNOSIS — E291 Testicular hypofunction: Secondary | ICD-10-CM | POA: Diagnosis not present

## 2013-09-08 DIAGNOSIS — N400 Enlarged prostate without lower urinary tract symptoms: Secondary | ICD-10-CM | POA: Diagnosis not present

## 2013-09-08 DIAGNOSIS — N529 Male erectile dysfunction, unspecified: Secondary | ICD-10-CM | POA: Diagnosis not present

## 2013-09-08 DIAGNOSIS — R350 Frequency of micturition: Secondary | ICD-10-CM | POA: Diagnosis not present

## 2013-10-26 ENCOUNTER — Other Ambulatory Visit: Payer: Self-pay

## 2013-10-26 MED ORDER — TESTOSTERONE 50 MG/5GM (1%) TD GEL: 5.0000 g | Freq: Every day | TRANSDERMAL | Status: DC

## 2013-10-26 MED ORDER — TEMAZEPAM 30 MG PO CAPS: 30.0000 mg | ORAL_CAPSULE | Freq: Every day | ORAL | Status: DC

## 2013-10-26 NOTE — Telephone Encounter (Signed)
Done hardcopy to robin  

## 2013-10-27 NOTE — Telephone Encounter (Signed)
Faxed hardcopy's to [employer]

## 2013-11-02 ENCOUNTER — Telehealth: Payer: Self-pay

## 2013-11-02 NOTE — Telephone Encounter (Signed)
Received PA  Approval for Restoril 30 mg.  Authorization is good from 10/06/13 through 10/07/14.  Patient informed.

## 2013-11-23 ENCOUNTER — Other Ambulatory Visit (INDEPENDENT_AMBULATORY_CARE_PROVIDER_SITE_OTHER): Payer: Medicare Other

## 2013-11-23 ENCOUNTER — Encounter: Payer: Self-pay | Admitting: Internal Medicine

## 2013-11-23 ENCOUNTER — Ambulatory Visit (INDEPENDENT_AMBULATORY_CARE_PROVIDER_SITE_OTHER): Payer: Medicare Other | Admitting: Internal Medicine

## 2013-11-23 ENCOUNTER — Ambulatory Visit (INDEPENDENT_AMBULATORY_CARE_PROVIDER_SITE_OTHER)
Admission: RE | Admit: 2013-11-23 | Discharge: 2013-11-23 | Disposition: A | Discharge status: Home / Self Care | Payer: Medicare Other | Source: Ambulatory Visit | Attending: Internal Medicine | Admitting: Internal Medicine

## 2013-11-23 VITALS — BP 138/70 | HR 82 | Temp 97.4°F | Wt 255.8 lb

## 2013-11-23 DIAGNOSIS — S8990XA Unspecified injury of unspecified lower leg, initial encounter: Secondary | ICD-10-CM | POA: Diagnosis not present

## 2013-11-23 DIAGNOSIS — S99919A Unspecified injury of unspecified ankle, initial encounter: Secondary | ICD-10-CM | POA: Diagnosis not present

## 2013-11-23 DIAGNOSIS — M79609 Pain in unspecified limb: Secondary | ICD-10-CM | POA: Diagnosis not present

## 2013-11-23 DIAGNOSIS — M79672 Pain in left foot: Secondary | ICD-10-CM | POA: Insufficient documentation

## 2013-11-23 DIAGNOSIS — I10 Essential (primary) hypertension: Secondary | ICD-10-CM

## 2013-11-23 DIAGNOSIS — M7989 Other specified soft tissue disorders: Secondary | ICD-10-CM | POA: Diagnosis not present

## 2013-11-23 LAB — BASIC METABOLIC PANEL
BUN: 13 mg/dL (ref 6–23)
CALCIUM: 9.2 mg/dL (ref 8.4–10.5)
CO2: 26 mEq/L (ref 19–32)
Chloride: 105 mEq/L (ref 96–112)
Creatinine, Ser: 1.4 mg/dL (ref 0.4–1.5)
GFR: 52.94 mL/min — AB (ref >60.00)
Glucose, Bld: 108 mg/dL — ABNORMAL HIGH (ref 70–99)
Potassium: 4.6 mEq/L (ref 3.5–5.1)
Sodium: 138 mEq/L (ref 135–145)

## 2013-11-23 LAB — SEDIMENTATION RATE: Sed Rate: 20 mm/hr (ref 0–22)

## 2013-11-23 LAB — URIC ACID: Uric Acid, Serum: 7 mg/dL (ref 4.0–7.8)

## 2013-11-23 MED ORDER — HYDROCODONE-ACETAMINOPHEN 5-325 MG PO TABS: 1.0000 | ORAL_TABLET | Freq: Two times a day (BID) | ORAL | Status: DC | PRN

## 2013-11-23 MED ORDER — DICLOFENAC SODIUM 75 MG PO TBEC
75.0000 mg | DELAYED_RELEASE_TABLET | Freq: Two times a day (BID) | ORAL | Status: DC
Start: 2013-11-23 — End: 2013-12-21

## 2013-11-23 NOTE — Assessment & Plan Note (Signed)
Continue with current prescription therapy as reflected on the Med list.  

## 2013-11-23 NOTE — Assessment & Plan Note (Addendum)
6/15 acute R/o gout, pseudogout, fx etc No signs of cellulitis  X ray Tramadol prn ACE Diclofenac

## 2013-11-23 NOTE — Progress Notes (Signed)
   Subjective:    Patient ID: Alex Kemp, male    DOB: August 04, 1940, 73 y.o.   MRN: [id number]  Leg Pain  The incident occurred 3 to 5 days ago. There was no injury mechanism. The pain is present in the left foot. The quality of the pain is described as aching. The pain is severe (w/walking). The pain has been constant since onset. Associated symptoms include an inability to bear weight. The symptoms are aggravated by weight bearing and movement. He has tried ice for the symptoms. The treatment provided no relief.      Review of Systems     Objective:   Physical Exam        Assessment & Plan:

## 2013-11-23 NOTE — Progress Notes (Signed)
Pre visit review using our clinic review tool, if applicable. No additional management support is needed unless otherwise documented below in the visit note. 

## 2013-11-30 ENCOUNTER — Ambulatory Visit: Payer: Medicare Other | Admitting: Internal Medicine

## 2013-12-21 ENCOUNTER — Telehealth: Payer: Self-pay

## 2013-12-21 ENCOUNTER — Ambulatory Visit (INDEPENDENT_AMBULATORY_CARE_PROVIDER_SITE_OTHER): Payer: Medicare Other | Admitting: Internal Medicine

## 2013-12-21 ENCOUNTER — Encounter: Payer: Self-pay | Admitting: Internal Medicine

## 2013-12-21 ENCOUNTER — Other Ambulatory Visit (INDEPENDENT_AMBULATORY_CARE_PROVIDER_SITE_OTHER): Payer: Medicare Other

## 2013-12-21 VITALS — BP 112/68 | HR 92 | Temp 98.2°F | Ht 72.0 in | Wt 253.1 lb

## 2013-12-21 DIAGNOSIS — I10 Essential (primary) hypertension: Secondary | ICD-10-CM

## 2013-12-21 DIAGNOSIS — E785 Hyperlipidemia, unspecified: Secondary | ICD-10-CM

## 2013-12-21 DIAGNOSIS — M79609 Pain in unspecified limb: Secondary | ICD-10-CM | POA: Diagnosis not present

## 2013-12-21 DIAGNOSIS — E291 Testicular hypofunction: Secondary | ICD-10-CM | POA: Diagnosis not present

## 2013-12-21 DIAGNOSIS — R972 Elevated prostate specific antigen [PSA]: Secondary | ICD-10-CM

## 2013-12-21 DIAGNOSIS — M79672 Pain in left foot: Secondary | ICD-10-CM

## 2013-12-21 LAB — URINALYSIS, ROUTINE W REFLEX MICROSCOPIC
Bilirubin Urine: NEGATIVE
Hgb urine dipstick: NEGATIVE
KETONES UR: NEGATIVE
LEUKOCYTES UA: NEGATIVE
NITRITE: NEGATIVE
RBC / HPF: NONE SEEN (ref 0–?)
Total Protein, Urine: NEGATIVE
Urine Glucose: NEGATIVE
Urobilinogen, UA: 0.2 (ref 0.0–1.0)
pH: 6 (ref 5.0–8.0)

## 2013-12-21 LAB — TSH: TSH: 2.61 u[IU]/mL (ref 0.35–4.50)

## 2013-12-21 LAB — CBC WITH DIFFERENTIAL/PLATELET
BASOS PCT: 0.5 % (ref 0.0–3.0)
Basophils Absolute: 0 10*3/uL (ref 0.0–0.1)
EOS PCT: 3.6 % (ref 0.0–5.0)
Eosinophils Absolute: 0.3 10*3/uL (ref 0.0–0.7)
HEMATOCRIT: 42.3 % (ref 39.0–52.0)
Hemoglobin: 14.8 g/dL (ref 13.0–17.0)
LYMPHS ABS: 2.5 10*3/uL (ref 0.7–4.0)
Lymphocytes Relative: 32.4 % (ref 12.0–46.0)
MCHC: 34.9 g/dL (ref 30.0–36.0)
MCV: 86.9 fl (ref 78.0–100.0)
MONO ABS: 0.8 10*3/uL (ref 0.1–1.0)
MONOS PCT: 10.6 % (ref 3.0–12.0)
Neutro Abs: 4.1 10*3/uL (ref 1.4–7.7)
Neutrophils Relative %: 52.9 % (ref 43.0–77.0)
PLATELETS: 205 10*3/uL (ref 150.0–400.0)
RBC: 4.87 Mil/uL (ref 4.22–5.81)
RDW: 15.4 % (ref 11.5–15.5)
WBC: 7.7 10*3/uL (ref 4.0–10.5)

## 2013-12-21 LAB — LIPID PANEL
CHOL/HDL RATIO: 4
CHOLESTEROL: 169 mg/dL (ref 0–200)
HDL: 45.5 mg/dL (ref >39.00)
LDL CALC: 85 mg/dL (ref 0–99)
NonHDL: 123.5
TRIGLYCERIDES: 194 mg/dL — AB (ref 0.0–149.0)
VLDL: 38.8 mg/dL (ref 0.0–40.0)

## 2013-12-21 LAB — PSA: PSA: 6.81 ng/mL — AB (ref 0.10–4.00)

## 2013-12-21 LAB — BASIC METABOLIC PANEL
BUN: 15 mg/dL (ref 6–23)
CALCIUM: 9 mg/dL (ref 8.4–10.5)
CO2: 28 mEq/L (ref 19–32)
CREATININE: 1.4 mg/dL (ref 0.4–1.5)
Chloride: 105 mEq/L (ref 96–112)
GFR: 51.23 mL/min — AB (ref >60.00)
GLUCOSE: 96 mg/dL (ref 70–99)
Potassium: 4.5 mEq/L (ref 3.5–5.1)
Sodium: 137 mEq/L (ref 135–145)

## 2013-12-21 LAB — HEPATIC FUNCTION PANEL
ALBUMIN: 4 g/dL (ref 3.5–5.2)
ALT: 30 U/L (ref 0–53)
AST: 26 U/L (ref 0–37)
Alkaline Phosphatase: 90 U/L (ref 39–117)
BILIRUBIN TOTAL: 0.6 mg/dL (ref 0.2–1.2)
Bilirubin, Direct: 0.1 mg/dL (ref 0.0–0.3)
Total Protein: 6.7 g/dL (ref 6.0–8.3)

## 2013-12-21 LAB — TESTOSTERONE: TESTOSTERONE: 391.93 ng/dL (ref 300.00–890.00)

## 2013-12-21 MED ORDER — TESTOSTERONE 50 MG/5GM (1%) TD GEL: 10.0000 g | Freq: Every day | TRANSDERMAL | Status: DC

## 2013-12-21 MED ORDER — TESTOSTERONE 50 MG/5GM (1%) TD GEL: TRANSDERMAL | Status: DC

## 2013-12-21 NOTE — Patient Instructions (Addendum)
Ok to increase the androgel to 2 packet per day  Please continue all other medications as before, and refills have been done if requested.  Please have the pharmacy call with any other refills you may need.  Please continue your efforts at being more active, low cholesterol diet, and weight control.  You are otherwise up to date with prevention measures today.  Please keep your appointments with your specialists as you may have planned  Please go to the LAB in the Basement (turn left off the elevator) for the tests to be done today  You will be contacted by phone if any changes need to be made immediately.  Otherwise, you will receive a letter about your results with an explanation, but please check with MyChart first.  Please return in 6 months, or sooner if needed

## 2013-12-21 NOTE — Telephone Encounter (Signed)
Done hardcopy to robin - rx corrected

## 2013-12-21 NOTE — Progress Notes (Signed)
Subjective:    Patient ID: Alex Kemp, male    DOB: Apr 14, 1941, 73 y.o.   MRN: [id number]  HPI  Here for yearly f/u;  Overall doing ok;  Pt denies CP, worsening SOB, DOE, wheezing, orthopnea, PND, worsening LE edema, palpitations, dizziness or syncope.  Pt denies neurological change such as new headache, facial or extremity weakness.  Pt denies polydipsia, polyuria, or low sugar symptoms. Pt states overall good compliance with treatment and medications, good tolerability, and has been trying to follow lower cholesterol diet.  Pt denies worsening depressive symptoms, suicidal ideation or panic. No fever, night sweats, wt loss, loss of appetite, or other constitutional symptoms.  Pt states good ability with ADL's, has low fall risk, home safety reviewed and adequate, no other significant changes in hearing or vision, and only occasionally active with exercise, though did job for 25 yrs. S/p foot injury with visit with Dr Alain Marion, film and labs neg, better now with anti-inflammatory, nearly all better the next day. Then bought a stair lift, asks for something written to save the sales tax as a medical device.  Taking androgel at 2 pct qod/1 pck qod alternating.  Past Medical History  Diagnosis Date  . GERD (gastroesophageal reflux disease)   . Headache(784.0)   . Hyperlipidemia   . Insomnia   . Hypertension   . Corneal injury   . Hx of colonic polyps    Past Surgical History  Procedure Laterality Date  . Panedoscopy  04/23/1999  . Lt are fracture    . Rt shoulder surgery    . Nasal sinus surgery    . Corneal injury    . Refreactory surgery to eyes    . Dental plate    . Penile prosthesis implant      reports that he quit smoking about 35 years ago. He does not have any smokeless tobacco history on file. He reports that he drinks alcohol. He reports that he does not use illicit drugs. family history includes Arthritis in his other. Allergies  Allergen Reactions  . Penicillins Other  (See Comments)    Reaction as a 65 or 73 year old    Current Outpatient Prescriptions on File Prior to Visit  Medication Sig Dispense Refill  . alfuzosin (UROXATRAL) 10 MG 24 hr tablet Take 1 tablet (10 mg total) by mouth every evening.  90 tablet  3  . aspirin EC 81 MG tablet Take 81 mg by mouth daily with lunch.      . b complex vitamins tablet Take 1 tablet by mouth daily with lunch.      . Coenzyme Q10 (COQ10 PO) Take 1 tablet by mouth daily with lunch.      . Glucosamine HCl (GLUCOSAMINE PO) Take 1 tablet by mouth daily with lunch.      . loperamide (IMODIUM) 2 MG capsule Take 2 mg by mouth 2 (two) times daily as needed for diarrhea or loose stools.       . Multiple Vitamin (MULTIVITAMIN WITH MINERALS) TABS tablet Take 1 tablet by mouth daily with lunch.      . Omega-3 Fatty Acids (FISH OIL PO) Take 1 capsule by mouth daily with lunch.      . pantoprazole (PROTONIX) 40 MG tablet Take 1 tablet (40 mg total) by mouth daily.  90 tablet  3  . Polyvinyl Alcohol-Povidone (REFRESH OP) Place 1 drop into both eyes 2 (two) times daily.      . Probiotic Product (PROBIOTIC FORMULA PO)  Take 1 capsule by mouth daily with lunch.       [name] rOPINIRole (REQUIP) 1 MG tablet TAKE 1 TABLET DAILY AT     BEDTIME  90 tablet  1  . simvastatin (ZOCOR) 40 MG tablet Take 1 tablet (40 mg total) by mouth every evening.  90 tablet  3  . solifenacin (VESICARE) 10 MG tablet Take 1 tablet (10 mg total) by mouth daily with lunch.  90 tablet  3  . temazepam (RESTORIL) 30 MG capsule Take 1 capsule (30 mg total) by mouth at bedtime.  30 capsule  5  . testosterone (ANDROGEL) 50 MG/5GM (1%) GEL Place 5-10 g onto the skin daily. Alternate every other day 1 packet (5 g) 1st day, 2 packets (10 g) 2nd day  145 Package  1  . valsartan (DIOVAN) 80 MG tablet TAKE 1 TABLET DAILY  90 tablet  3   No current facility-administered medications on file prior to visit.   Review of Systems Constitutional: Negative for increased diaphoresis,  other activity, appetite or other siginficant weight change  HENT: Negative for worsening hearing loss, ear pain, facial swelling, mouth sores and neck stiffness.   Eyes: Negative for other worsening pain, redness or visual disturbance.  Respiratory: Negative for shortness of breath and wheezing.   Cardiovascular: Negative for chest pain and palpitations.  Gastrointestinal: Negative for diarrhea, blood in stool, abdominal distention or other pain Genitourinary: Negative for hematuria, flank pain or change in urine volume.  Musculoskeletal: Negative for myalgias or other joint complaints.  Skin: Negative for color change and wound.  Neurological: Negative for syncope and numbness. other than noted Hematological: Negative for adenopathy. or other swelling Psychiatric/Behavioral: Negative for hallucinations, self-injury, decreased concentration or other worsening agitation.      Objective:   Physical Exam BP 112/68  Pulse 92  Temp(Src) 98.2 F (36.8 C) (Oral)  Ht 6' (1.829 m)  Wt 253 lb 2 oz (114.817 kg)  BMI 34.32 kg/m2  SpO2 97% VS noted,  Constitutional: Pt is oriented to person, place, and time. Appears well-developed and well-nourished.  Head: Normocephalic and atraumatic.  Right Ear: External ear normal.  Left Ear: External ear normal.  Nose: Nose normal.  Mouth/Throat: Oropharynx is clear and moist.  Eyes: Conjunctivae and EOM are normal. Pupils are equal, round, and reactive to light.  Neck: Normal range of motion. Neck supple. No JVD present. No tracheal deviation present.  Cardiovascular: Normal rate, regular rhythm, normal heart sounds and intact distal pulses.   Pulmonary/Chest: Effort normal and breath sounds without rales or wheezing  Abdominal: Soft. Bowel sounds are normal. NT. No HSM  Musculoskeletal: Normal range of motion. Exhibits no edema.  Lymphadenopathy:  Has no cervical adenopathy.  Neurological: Pt is alert and oriented to person, place, and time. Pt has  normal reflexes. No cranial nerve deficit. Motor grossly intact Skin: Skin is warm and dry. No rash noted.  Psychiatric:  Has normal mood and affect. Behavior is normal.         Assessment & Plan:

## 2013-12-21 NOTE — Telephone Encounter (Signed)
Please Clarifiy instructions on script sent in today for Androgel. Pharmacy stated script has conflicting directions.  Advise please.

## 2013-12-21 NOTE — Assessment & Plan Note (Signed)
To incr the androgel to 10 gm/2 pck per day, for testost level

## 2013-12-21 NOTE — Assessment & Plan Note (Signed)
stable overall by history and exam, recent data reviewed with pt, and pt to continue medical treatment as before,  to f/u any worsening symptoms or concerns BP Readings from Last 3 Encounters:  12/21/13 112/68  11/23/13 138/70  06/18/13 118/78

## 2013-12-21 NOTE — Progress Notes (Signed)
Pre visit review using our clinic review tool, if applicable. No additional management support is needed unless otherwise documented below in the visit note. 

## 2013-12-21 NOTE — Assessment & Plan Note (Signed)
Lab Results  Component Value Date   LDLCALC 98 12/25/2012   stable overall by history and exam, recent data reviewed with pt, and pt to continue medical treatment as before,  to f/u any worsening symptoms or concerns

## 2013-12-21 NOTE — Assessment & Plan Note (Signed)
?   Tendon or ligament soft tissue injury, now resolve,. to f/u any worsening symptoms or concerns

## 2013-12-22 NOTE — Telephone Encounter (Signed)
Faxed corrected prescription to [facility].

## 2013-12-28 ENCOUNTER — Ambulatory Visit: Payer: Medicare Other | Admitting: Internal Medicine

## 2014-02-09 DIAGNOSIS — D1801 Hemangioma of skin and subcutaneous tissue: Secondary | ICD-10-CM | POA: Diagnosis not present

## 2014-02-09 DIAGNOSIS — D2370 Other benign neoplasm of skin of unspecified lower limb, including hip: Secondary | ICD-10-CM | POA: Diagnosis not present

## 2014-02-09 DIAGNOSIS — L570 Actinic keratosis: Secondary | ICD-10-CM | POA: Diagnosis not present

## 2014-02-09 DIAGNOSIS — D230 Other benign neoplasm of skin of lip: Secondary | ICD-10-CM | POA: Diagnosis not present

## 2014-02-09 DIAGNOSIS — L821 Other seborrheic keratosis: Secondary | ICD-10-CM | POA: Diagnosis not present

## 2014-02-09 DIAGNOSIS — Z85828 Personal history of other malignant neoplasm of skin: Secondary | ICD-10-CM | POA: Diagnosis not present

## 2014-02-09 DIAGNOSIS — D2360 Other benign neoplasm of skin of unspecified upper limb, including shoulder: Secondary | ICD-10-CM | POA: Diagnosis not present

## 2014-03-14 DIAGNOSIS — Z23 Encounter for immunization: Secondary | ICD-10-CM | POA: Diagnosis not present

## 2014-03-16 ENCOUNTER — Telehealth: Payer: Self-pay

## 2014-03-16 MED ORDER — ROPINIROLE HCL 1 MG PO TABS: 1.0000 mg | ORAL_TABLET | Freq: Every day | ORAL | Status: DC

## 2014-03-16 NOTE — Telephone Encounter (Signed)
Refill done to mail order.     

## 2014-04-07 DIAGNOSIS — H04123 Dry eye syndrome of bilateral lacrimal glands: Secondary | ICD-10-CM | POA: Diagnosis not present

## 2014-04-07 DIAGNOSIS — H43813 Vitreous degeneration, bilateral: Secondary | ICD-10-CM | POA: Diagnosis not present

## 2014-04-07 DIAGNOSIS — H2513 Age-related nuclear cataract, bilateral: Secondary | ICD-10-CM | POA: Diagnosis not present

## 2014-04-07 DIAGNOSIS — H16103 Unspecified superficial keratitis, bilateral: Secondary | ICD-10-CM | POA: Diagnosis not present

## 2014-04-25 ENCOUNTER — Telehealth: Payer: Self-pay | Admitting: Internal Medicine

## 2014-04-25 NOTE — Telephone Encounter (Signed)
Pt requesting, Temazepam sent to [facility] - [phone] (mail order)

## 2014-04-26 MED ORDER — TEMAZEPAM 30 MG PO CAPS: 30.0000 mg | ORAL_CAPSULE | Freq: Every day | ORAL | Status: DC

## 2014-04-26 NOTE — Telephone Encounter (Signed)
Done hardcopy to robin  

## 2014-04-27 NOTE — Telephone Encounter (Signed)
Hardcopy faxed to [facility] as requested.

## 2014-05-13 ENCOUNTER — Encounter: Payer: Self-pay | Admitting: Internal Medicine

## 2014-06-15 ENCOUNTER — Other Ambulatory Visit: Payer: Self-pay

## 2014-06-15 MED ORDER — VALSARTAN 80 MG PO TABS: 80.0000 mg | ORAL_TABLET | Freq: Every day | ORAL | Status: DC

## 2014-06-23 ENCOUNTER — Ambulatory Visit (INDEPENDENT_AMBULATORY_CARE_PROVIDER_SITE_OTHER): Payer: Medicare Other | Admitting: Internal Medicine

## 2014-06-23 ENCOUNTER — Encounter: Payer: Self-pay | Admitting: Internal Medicine

## 2014-06-23 ENCOUNTER — Other Ambulatory Visit (INDEPENDENT_AMBULATORY_CARE_PROVIDER_SITE_OTHER): Payer: Medicare Other

## 2014-06-23 VITALS — BP 112/78 | HR 87 | Temp 97.9°F | Ht 72.0 in | Wt 253.2 lb

## 2014-06-23 DIAGNOSIS — R972 Elevated prostate specific antigen [PSA]: Secondary | ICD-10-CM | POA: Diagnosis not present

## 2014-06-23 DIAGNOSIS — I10 Essential (primary) hypertension: Secondary | ICD-10-CM

## 2014-06-23 DIAGNOSIS — E785 Hyperlipidemia, unspecified: Secondary | ICD-10-CM

## 2014-06-23 LAB — PSA: PSA: 5.65 ng/mL — AB (ref 0.10–4.00)

## 2014-06-23 NOTE — Patient Instructions (Signed)

## 2014-06-23 NOTE — Progress Notes (Signed)
Subjective:    Patient ID: Alex Kemp, male    DOB: 1941-04-08, 74 y.o.   MRN: [id number]  HPI  Here to f/u; overall doing ok,  Pt denies chest pain, increased sob or doe, wheezing, orthopnea, PND, increased LE swelling, palpitations, dizziness or syncope.  Pt denies polydipsia, polyuria, or low sugar symptoms such as weakness or confusion improved with po intake.  Pt denies new neurological symptoms such as new headache, or facial or extremity weakness or numbness.   Pt states overall good compliance with meds, has been trying to follow lower cholesterol diet, with wt overall stable,  but little exercise however. Pt continues to have recurring LBP without bowel or bladder change, fever, wt loss,  worsening LE pain/numbness/weakness, gait change or falls.  Did have severe left back pain spasm episode , better after 2 wks with diclofenac he had on hand. Denies urinary symptoms such as dysuria, frequency, urgency, flank pain, hematuria or n/v, fever, chills.   Does also have recurring dorsal right foot electrical shock type pain to palpation of mid foot and radiation to 3rd toe. Also has very occas severe anterior right knee pain, intermittent, difficult to walk even 1 mile when pain acting up, but better next day.     Past Medical History  Diagnosis Date  . GERD (gastroesophageal reflux disease)   . Headache(784.0)   . Hyperlipidemia   . Insomnia   . Hypertension   . Corneal injury   . Hx of colonic polyps    Past Surgical History  Procedure Laterality Date  . Panedoscopy  04/23/1999  . Lt are fracture    . Rt shoulder surgery    . Nasal sinus surgery    . Corneal injury    . Refreactory surgery to eyes    . Dental plate    . Penile prosthesis implant      reports that he quit smoking about 35 years ago. He does not have any smokeless tobacco history on file. He reports that he drinks alcohol. He reports that he does not use illicit drugs. family history includes Arthritis in his  other. Allergies  Allergen Reactions  . Penicillins Other (See Comments)    Reaction as a 59 or 74 year old   Current Outpatient Prescriptions on File Prior to Visit  Medication Sig Dispense Refill  . alfuzosin (UROXATRAL) 10 MG 24 hr tablet Take 1 tablet (10 mg total) by mouth every evening. 90 tablet 3  . aspirin EC 81 MG tablet Take 81 mg by mouth daily with lunch.    . b complex vitamins tablet Take 1 tablet by mouth daily with lunch.    . Coenzyme Q10 (COQ10 PO) Take 1 tablet by mouth daily with lunch.    . Glucosamine HCl (GLUCOSAMINE PO) Take 1 tablet by mouth daily with lunch.    . loperamide (IMODIUM) 2 MG capsule Take 2 mg by mouth 2 (two) times daily as needed for diarrhea or loose stools.     . Multiple Vitamin (MULTIVITAMIN WITH MINERALS) TABS tablet Take 1 tablet by mouth daily with lunch.    . Omega-3 Fatty Acids (FISH OIL PO) Take 1 capsule by mouth daily with lunch.    . pantoprazole (PROTONIX) 40 MG tablet Take 1 tablet (40 mg total) by mouth daily. 90 tablet 3  . Polyvinyl Alcohol-Povidone (REFRESH OP) Place 1 drop into both eyes 2 (two) times daily.    . Probiotic Product (PROBIOTIC FORMULA PO) Take 1 capsule by  mouth daily with lunch.     [name] rOPINIRole (REQUIP) 1 MG tablet Take 1 tablet (1 mg total) by mouth at bedtime. 90 tablet 3  . simvastatin (ZOCOR) 40 MG tablet Take 1 tablet (40 mg total) by mouth every evening. 90 tablet 3  . solifenacin (VESICARE) 10 MG tablet Take 1 tablet (10 mg total) by mouth daily with lunch. 90 tablet 3  . temazepam (RESTORIL) 30 MG capsule Take 1 capsule (30 mg total) by mouth at bedtime. 90 capsule 1  . testosterone (ANDROGEL) 50 MG/5GM (1%) GEL 2 packets (10 g) per day use as directed 180 Package 1  . valsartan (DIOVAN) 80 MG tablet Take 1 tablet (80 mg total) by mouth daily. 90 tablet 3   No current facility-administered medications on file prior to visit.   Review of Systems  Constitutional: Negative for unusual diaphoresis or other  sweats  HENT: Negative for ringing in ear Eyes: Negative for double vision or worsening visual disturbance.  Respiratory: Negative for choking and stridor.   Gastrointestinal: Negative for vomiting or other signifcant bowel change Genitourinary: Negative for hematuria or decreased urine volume.  Musculoskeletal: Negative for other MSK pain or swelling Skin: Negative for color change and worsening wound.  Neurological: Negative for tremors and numbness other than noted  Psychiatric/Behavioral: Negative for decreased concentration or agitation other than above       Objective:   Physical Exam BP 112/78 mmHg  Pulse 87  Temp(Src) 97.9 F (36.6 C) (Oral)  Ht 6' (1.829 m)  Wt 253 lb 4 oz (114.873 kg)  BMI 34.34 kg/m2  SpO2 96% VS noted,  Constitutional: Pt appears well-developed, well-nourished.  HENT: Head: NCAT.  Right Ear: External ear normal.  Left Ear: External ear normal.  Eyes: . Pupils are equal, round, and reactive to light. Conjunctivae and EOM are normal Neck: Normal range of motion. Neck supple.  Cardiovascular: Normal rate and regular rhythm.   Pulmonary/Chest: Effort normal and breath sounds without rales or wheezing.  Abd:  Soft, NT, ND, + BS Neurological: Pt is alert. Not confused , motor grossly intact Skin: Skin is warm. No rash Psychiatric: Pt behavior is normal. No agitation.     Assessment & Plan:

## 2014-06-23 NOTE — Progress Notes (Signed)
Pre visit review using our clinic review tool, if applicable. No additional management support is needed unless otherwise documented below in the visit note. 

## 2014-06-25 NOTE — Assessment & Plan Note (Signed)
For f/u psa,  to f/u any worsening symptoms or concerns Lab Results  Component Value Date   PSA 5.65* 06/23/2014   PSA 6.81* 12/21/2013   PSA 4.06* 12/25/2012

## 2014-06-25 NOTE — Assessment & Plan Note (Signed)
BP Readings from Last 3 Encounters:  06/23/14 112/78  12/21/13 112/68  11/23/13 138/70

## 2014-06-25 NOTE — Assessment & Plan Note (Signed)
stable overall by history and exam, recent data reviewed with pt, and pt to continue medical treatment as before,  to f/u any worsening symptoms or concerns Lab Results  Component Value Date   LDLCALC 85 12/21/2013

## 2014-07-05 ENCOUNTER — Other Ambulatory Visit: Payer: Self-pay | Admitting: Internal Medicine

## 2014-07-28 ENCOUNTER — Other Ambulatory Visit: Payer: Self-pay

## 2014-07-28 MED ORDER — PANTOPRAZOLE SODIUM 40 MG PO TBEC: 40.0000 mg | DELAYED_RELEASE_TABLET | Freq: Every day | ORAL | Status: DC

## 2014-08-05 ENCOUNTER — Other Ambulatory Visit: Payer: Self-pay | Admitting: Internal Medicine

## 2014-08-05 ENCOUNTER — Other Ambulatory Visit: Payer: Self-pay | Admitting: *Deleted

## 2014-08-05 MED ORDER — PANTOPRAZOLE SODIUM 40 MG PO TBEC: 40.0000 mg | DELAYED_RELEASE_TABLET | Freq: Every day | ORAL | Status: DC

## 2014-09-12 ENCOUNTER — Other Ambulatory Visit: Payer: Self-pay | Admitting: *Deleted

## 2014-09-12 NOTE — Telephone Encounter (Signed)
Received fax pt requesting refill on his Androgel 1%

## 2014-09-13 DIAGNOSIS — N528 Other male erectile dysfunction: Secondary | ICD-10-CM | POA: Diagnosis not present

## 2014-09-13 DIAGNOSIS — E291 Testicular hypofunction: Secondary | ICD-10-CM | POA: Diagnosis not present

## 2014-09-13 DIAGNOSIS — N401 Enlarged prostate with lower urinary tract symptoms: Secondary | ICD-10-CM | POA: Diagnosis not present

## 2014-09-13 MED ORDER — TESTOSTERONE 50 MG/5GM (1%) TD GEL: TRANSDERMAL | Status: DC

## 2014-09-13 NOTE — Telephone Encounter (Signed)
Sent to pharmacy 

## 2014-09-13 NOTE — Telephone Encounter (Signed)
Done hardcopy to Cherina  

## 2014-09-29 DIAGNOSIS — K449 Diaphragmatic hernia without obstruction or gangrene: Secondary | ICD-10-CM | POA: Diagnosis not present

## 2014-09-29 DIAGNOSIS — Z86010 Personal history of colonic polyps: Secondary | ICD-10-CM | POA: Diagnosis not present

## 2014-09-29 DIAGNOSIS — R197 Diarrhea, unspecified: Secondary | ICD-10-CM | POA: Diagnosis not present

## 2014-09-29 DIAGNOSIS — K210 Gastro-esophageal reflux disease with esophagitis: Secondary | ICD-10-CM | POA: Diagnosis not present

## 2014-10-06 ENCOUNTER — Encounter: Payer: Self-pay | Admitting: Internal Medicine

## 2014-10-12 ENCOUNTER — Encounter: Payer: Self-pay | Admitting: Internal Medicine

## 2014-10-12 DIAGNOSIS — R972 Elevated prostate specific antigen [PSA]: Secondary | ICD-10-CM | POA: Diagnosis not present

## 2014-10-12 DIAGNOSIS — K210 Gastro-esophageal reflux disease with esophagitis: Secondary | ICD-10-CM | POA: Diagnosis not present

## 2014-10-12 DIAGNOSIS — R197 Diarrhea, unspecified: Secondary | ICD-10-CM | POA: Diagnosis not present

## 2014-10-18 ENCOUNTER — Encounter: Payer: Self-pay | Admitting: Internal Medicine

## 2014-10-20 ENCOUNTER — Other Ambulatory Visit: Payer: Self-pay

## 2014-10-20 NOTE — Telephone Encounter (Signed)
Please advise on refill request in PCP's absence, thanks!

## 2014-10-22 MED ORDER — TEMAZEPAM 30 MG PO CAPS: 30.0000 mg | ORAL_CAPSULE | Freq: Every day | ORAL | Status: DC

## 2014-10-25 MED ORDER — TEMAZEPAM 30 MG PO CAPS: 30.0000 mg | ORAL_CAPSULE | Freq: Every day | ORAL | Status: DC

## 2014-10-25 NOTE — Telephone Encounter (Signed)
Rx faxed to pharmacy  

## 2014-10-25 NOTE — Addendum Note (Signed)
Addended by: Lyman Bishop on: 10/25/2014 09:11 AM   Modules accepted: Orders

## 2014-12-22 ENCOUNTER — Encounter: Payer: Self-pay | Admitting: Internal Medicine

## 2014-12-22 ENCOUNTER — Other Ambulatory Visit (INDEPENDENT_AMBULATORY_CARE_PROVIDER_SITE_OTHER): Payer: Medicare Other

## 2014-12-22 ENCOUNTER — Ambulatory Visit (INDEPENDENT_AMBULATORY_CARE_PROVIDER_SITE_OTHER): Payer: Medicare Other | Admitting: Internal Medicine

## 2014-12-22 VITALS — BP 124/66 | HR 80 | Temp 97.7°F | Ht 72.0 in | Wt 260.0 lb

## 2014-12-22 DIAGNOSIS — R972 Elevated prostate specific antigen [PSA]: Secondary | ICD-10-CM

## 2014-12-22 DIAGNOSIS — E785 Hyperlipidemia, unspecified: Secondary | ICD-10-CM

## 2014-12-22 DIAGNOSIS — I10 Essential (primary) hypertension: Secondary | ICD-10-CM | POA: Diagnosis not present

## 2014-12-22 LAB — BASIC METABOLIC PANEL WITH GFR
BUN: 15 mg/dL (ref 6–23)
CO2: 28 meq/L (ref 19–32)
Calcium: 9.3 mg/dL (ref 8.4–10.5)
Chloride: 105 meq/L (ref 96–112)
Creatinine, Ser: 1.53 mg/dL — ABNORMAL HIGH (ref 0.40–1.50)
GFR: 47.64 mL/min — ABNORMAL LOW
Glucose, Bld: 86 mg/dL (ref 70–99)
Potassium: 4.8 meq/L (ref 3.5–5.1)
Sodium: 139 meq/L (ref 135–145)

## 2014-12-22 LAB — CBC WITH DIFFERENTIAL/PLATELET
BASOS PCT: 0.6 % (ref 0.0–3.0)
Basophils Absolute: 0 10*3/uL (ref 0.0–0.1)
Eosinophils Absolute: 0.3 10*3/uL (ref 0.0–0.7)
Eosinophils Relative: 3.6 % (ref 0.0–5.0)
HCT: 44.7 % (ref 39.0–52.0)
Hemoglobin: 15.1 g/dL (ref 13.0–17.0)
Lymphocytes Relative: 33.8 % (ref 12.0–46.0)
Lymphs Abs: 2.5 10*3/uL (ref 0.7–4.0)
MCHC: 33.8 g/dL (ref 30.0–36.0)
MCV: 83.3 fl (ref 78.0–100.0)
MONO ABS: 0.8 10*3/uL (ref 0.1–1.0)
MONOS PCT: 10.7 % (ref 3.0–12.0)
NEUTROS ABS: 3.9 10*3/uL (ref 1.4–7.7)
Neutrophils Relative %: 51.3 % (ref 43.0–77.0)
PLATELETS: 196 10*3/uL (ref 150.0–400.0)
RBC: 5.37 Mil/uL (ref 4.22–5.81)
RDW: 15.2 % (ref 11.5–15.5)
WBC: 7.5 10*3/uL (ref 4.0–10.5)

## 2014-12-22 LAB — HEPATIC FUNCTION PANEL
ALT: 24 U/L (ref 0–53)
AST: 19 U/L (ref 0–37)
Albumin: 4.2 g/dL (ref 3.5–5.2)
Alkaline Phosphatase: 102 U/L (ref 39–117)
Bilirubin, Direct: 0.1 mg/dL (ref 0.0–0.3)
Total Bilirubin: 0.5 mg/dL (ref 0.2–1.2)
Total Protein: 6.8 g/dL (ref 6.0–8.3)

## 2014-12-22 LAB — URINALYSIS, ROUTINE W REFLEX MICROSCOPIC
Bilirubin Urine: NEGATIVE
Hgb urine dipstick: NEGATIVE
Ketones, ur: NEGATIVE
Leukocytes, UA: NEGATIVE
Nitrite: NEGATIVE
RBC / HPF: NONE SEEN
Specific Gravity, Urine: 1.01
Total Protein, Urine: NEGATIVE
Urine Glucose: NEGATIVE
Urobilinogen, UA: 0.2
WBC, UA: NONE SEEN
pH: 6 (ref 5.0–8.0)

## 2014-12-22 LAB — LIPID PANEL
Cholesterol: 169 mg/dL (ref 0–200)
HDL: 47.8 mg/dL (ref >39.00)
NonHDL: 121.46
Total CHOL/HDL Ratio: 4
Triglycerides: 227 mg/dL — ABNORMAL HIGH (ref 0.0–149.0)
VLDL: 45.4 mg/dL — AB (ref 0.0–40.0)

## 2014-12-22 LAB — TSH: TSH: 2.77 u[IU]/mL (ref 0.35–4.50)

## 2014-12-22 LAB — LDL CHOLESTEROL, DIRECT: Direct LDL: 93 mg/dL

## 2014-12-22 LAB — PSA: PSA: 4.96 ng/mL — AB (ref 0.10–4.00)

## 2014-12-22 MED ORDER — SCOPOLAMINE 1 MG/3DAYS TD PT72: 1.0000 | MEDICATED_PATCH | TRANSDERMAL | Status: DC

## 2014-12-22 MED ORDER — PANTOPRAZOLE SODIUM 40 MG PO TBEC: 40.0000 mg | DELAYED_RELEASE_TABLET | Freq: Two times a day (BID) | ORAL | Status: DC

## 2014-12-22 NOTE — Progress Notes (Signed)
Pre visit review using our clinic review tool, if applicable. No additional management support is needed unless otherwise documented below in the visit note. 

## 2014-12-22 NOTE — Progress Notes (Signed)
Subjective:    Patient ID: Alex Kemp, male    DOB: 13-Jun-1940, 74 y.o.   MRN: [id number]  HPI   Here for yearly f/u;  Overall doing ok;  Pt denies Chest pain, worsening SOB, DOE, wheezing, orthopnea, PND, worsening LE edema, palpitations, dizziness or syncope.  Pt denies neurological change such as new headache, facial or extremity weakness.  Pt denies polydipsia, polyuria, or low sugar symptoms. Pt states overall good compliance with treatment and medications, good tolerability, and has been trying to follow appropriate diet.  Pt denies worsening depressive symptoms, suicidal ideation or panic. No fever, night sweats, wt loss, loss of appetite, or other constitutional symptoms.  Pt states good ability with ADL's, has low fall risk, home safety reviewed and adequate, no other significant changes in hearing or vision, and only occasionally active with exercise. Still working fulltime as Comptroller.   Now on protonix 40 bid per GI, seems to really help. Past Medical History  Diagnosis Date  . GERD (gastroesophageal reflux disease)   . Headache(784.0)   . Hyperlipidemia   . Insomnia   . Hypertension   . Corneal injury   . Hx of colonic polyps    Past Surgical History  Procedure Laterality Date  . Panedoscopy  04/23/1999  . Lt are fracture    . Rt shoulder surgery    . Nasal sinus surgery    . Corneal injury    . Refreactory surgery to eyes    . Dental plate    . Penile prosthesis implant      reports that he quit smoking about 36 years ago. He does not have any smokeless tobacco history on file. He reports that he drinks alcohol. He reports that he does not use illicit drugs. family history includes Arthritis in his other. Allergies  Allergen Reactions  . Penicillins Other (See Comments)    Reaction as a 51 or 74 year old   Current Outpatient Prescriptions on File Prior to Visit  Medication Sig Dispense Refill  . alfuzosin (UROXATRAL) 10 MG 24 hr tablet TAKE 1 TABLET EVERY EVENING 90  tablet 3  . aspirin EC 81 MG tablet Take 81 mg by mouth daily with lunch.    . b complex vitamins tablet Take 1 tablet by mouth daily with lunch.    . Coenzyme Q10 (COQ10 PO) Take 1 tablet by mouth daily with lunch.    . diclofenac (VOLTAREN) 75 MG EC tablet Take 75 mg by mouth 2 (two) times daily.    . Glucosamine HCl (GLUCOSAMINE PO) Take 1 tablet by mouth daily with lunch.    . loperamide (IMODIUM) 2 MG capsule Take 2 mg by mouth 2 (two) times daily as needed for diarrhea or loose stools.     . Multiple Vitamin (MULTIVITAMIN WITH MINERALS) TABS tablet Take 1 tablet by mouth daily with lunch.    . Omega-3 Fatty Acids (FISH OIL PO) Take 1 capsule by mouth daily with lunch.    . Polyvinyl Alcohol-Povidone (REFRESH OP) Place 1 drop into both eyes 2 (two) times daily.    . Probiotic Product (PROBIOTIC FORMULA PO) Take 1 capsule by mouth daily with lunch.     [name] rOPINIRole (REQUIP) 1 MG tablet Take 1 tablet (1 mg total) by mouth at bedtime. 90 tablet 3  . simvastatin (ZOCOR) 40 MG tablet TAKE 1 TABLET EVERY EVENING 90 tablet 3  . solifenacin (VESICARE) 10 MG tablet Take 1 tablet (10 mg total) by mouth daily with  lunch. 90 tablet 3  . temazepam (RESTORIL) 30 MG capsule Take 1 capsule (30 mg total) by mouth at bedtime. 90 capsule 1  . testosterone (ANDROGEL) 50 MG/5GM (1%) GEL 2 packets (10 g) per day use as directed 180 Package 1  . valsartan (DIOVAN) 80 MG tablet Take 1 tablet (80 mg total) by mouth daily. 90 tablet 3   No current facility-administered medications on file prior to visit.   Review of Systems Constitutional: Negative for increased diaphoresis, other activity, appetite or siginficant weight change other than noted HENT: Negative for worsening hearing loss, ear pain, facial swelling, mouth sores and neck stiffness.   Eyes: Negative for other worsening pain, redness or visual disturbance.  Respiratory: Negative for shortness of breath and wheezing  Cardiovascular: Negative for chest  pain and palpitations.  Gastrointestinal: Negative for diarrhea, blood in stool, abdominal distention or other pain Genitourinary: Negative for hematuria, flank pain or change in urine volume.  Musculoskeletal: Negative for myalgias or other joint complaints.  Skin: Negative for color change and wound or drainage.  Neurological: Negative for syncope and numbness. other than noted Hematological: Negative for adenopathy. or other swelling Psychiatric/Behavioral: Negative for hallucinations, SI, self-injury, decreased concentration or other worsening agitation.      Objective:   Physical Exam BP 124/66 mmHg  Pulse 80  Temp(Src) 97.7 F (36.5 C) (Oral)  Ht 6' (1.829 m)  Wt 260 lb (117.935 kg)  BMI 35.25 kg/m2  SpO2 97% VS noted,  Constitutional: Pt is oriented to person, place, and time. Appears well-developed and well-nourished, in no significant distress Head: Normocephalic and atraumatic.  Right Ear: External ear normal.  Left Ear: External ear normal.  Nose: Nose normal.  Mouth/Throat: Oropharynx is clear and moist.  Eyes: Conjunctivae and EOM are normal. Pupils are equal, round, and reactive to light.  Neck: Normal range of motion. Neck supple. No JVD present. No tracheal deviation present or significant neck LA or mass Cardiovascular: Normal rate, regular rhythm, normal heart sounds and intact distal pulses.   Pulmonary/Chest: Effort normal and breath sounds without rales or wheezing  Abdominal: Soft. Bowel sounds are normal. NT. No HSM  Musculoskeletal: Normal range of motion. Exhibits no edema.  Lymphadenopathy:  Has no cervical adenopathy.  Neurological: Pt is alert and oriented to person, place, and time. Pt has normal reflexes. No cranial nerve deficit. Motor grossly intact Skin: Skin is warm and dry. No rash noted.  Psychiatric:  Has normal mood and affect. Behavior is normal.     Assessment & Plan:

## 2014-12-22 NOTE — Assessment & Plan Note (Signed)
stable overall by history and exam, recent data reviewed with pt, and pt to continue medical treatment as before,  to f/u any worsening symptoms or concerns BP Readings from Last 3 Encounters:  12/22/14 124/66  06/23/14 112/78  12/21/13 112/68

## 2014-12-22 NOTE — Assessment & Plan Note (Signed)
Also for f/u psa as he is due,  to f/u any worsening symptoms or concerns

## 2014-12-22 NOTE — Assessment & Plan Note (Signed)
stable overall by history and exam, recent data reviewed with pt, and pt to continue medical treatment as before,  to f/u any worsening symptoms or concerns Lab Results  Component Value Date   LDLCALC 85 12/21/2013   For f/u labs

## 2014-12-22 NOTE — Patient Instructions (Signed)
Please take all new medication as prescribed - the seasickness patch  Please continue all other medications as before, and refills have been done if requested.  Please have the pharmacy call with any other refills you may need.  Please continue your efforts at being more active, low cholesterol diet, and weight control.  You are otherwise up to date with prevention measures today.  Please keep your appointments with your specialists as you may have planned  Please go to the LAB in the Basement (turn left off the elevator) for the tests to be done today  You will be contacted by phone if any changes need to be made immediately.  Otherwise, you will receive a letter about your results with an explanation, but please check with MyChart first.  Please remember to sign up for MyChart if you have not done so, as this will be important to you in the future with finding out test results, communicating by private email, and scheduling acute appointments online when needed.  Please return in 1 year for your yearly visit, or sooner if needed

## 2015-02-15 DIAGNOSIS — L821 Other seborrheic keratosis: Secondary | ICD-10-CM | POA: Diagnosis not present

## 2015-02-15 DIAGNOSIS — L570 Actinic keratosis: Secondary | ICD-10-CM | POA: Diagnosis not present

## 2015-02-15 DIAGNOSIS — D1801 Hemangioma of skin and subcutaneous tissue: Secondary | ICD-10-CM | POA: Diagnosis not present

## 2015-02-15 DIAGNOSIS — L723 Sebaceous cyst: Secondary | ICD-10-CM | POA: Diagnosis not present

## 2015-02-15 DIAGNOSIS — D2372 Other benign neoplasm of skin of left lower limb, including hip: Secondary | ICD-10-CM | POA: Diagnosis not present

## 2015-02-15 DIAGNOSIS — Z85828 Personal history of other malignant neoplasm of skin: Secondary | ICD-10-CM | POA: Diagnosis not present

## 2015-02-27 ENCOUNTER — Other Ambulatory Visit: Payer: Self-pay

## 2015-02-27 ENCOUNTER — Other Ambulatory Visit: Payer: Self-pay | Admitting: Internal Medicine

## 2015-02-27 MED ORDER — TESTOSTERONE 50 MG/5GM (1%) TD GEL: TRANSDERMAL | Status: DC

## 2015-02-28 ENCOUNTER — Other Ambulatory Visit: Payer: Self-pay

## 2015-02-28 MED ORDER — TESTOSTERONE 50 MG/5GM (1%) TD GEL: TRANSDERMAL | Status: DC

## 2015-04-09 ENCOUNTER — Other Ambulatory Visit: Payer: Self-pay | Admitting: Internal Medicine

## 2015-04-10 DIAGNOSIS — H25813 Combined forms of age-related cataract, bilateral: Secondary | ICD-10-CM | POA: Diagnosis not present

## 2015-04-10 DIAGNOSIS — H52203 Unspecified astigmatism, bilateral: Secondary | ICD-10-CM | POA: Diagnosis not present

## 2015-04-10 DIAGNOSIS — H04123 Dry eye syndrome of bilateral lacrimal glands: Secondary | ICD-10-CM | POA: Diagnosis not present

## 2015-04-10 DIAGNOSIS — H43813 Vitreous degeneration, bilateral: Secondary | ICD-10-CM | POA: Diagnosis not present

## 2015-04-18 ENCOUNTER — Other Ambulatory Visit: Payer: Self-pay

## 2015-04-18 MED ORDER — TEMAZEPAM 30 MG PO CAPS: 30.0000 mg | ORAL_CAPSULE | Freq: Every day | ORAL | Status: DC

## 2015-04-18 NOTE — Telephone Encounter (Signed)
Rx faxed to pharmacy  

## 2015-04-18 NOTE — Telephone Encounter (Signed)
Done hardcopy to Dahlia  

## 2015-04-19 ENCOUNTER — Other Ambulatory Visit: Payer: Self-pay | Admitting: Internal Medicine

## 2015-07-05 ENCOUNTER — Other Ambulatory Visit: Payer: Self-pay | Admitting: Internal Medicine

## 2015-08-15 ENCOUNTER — Other Ambulatory Visit: Payer: Self-pay

## 2015-08-15 MED ORDER — TESTOSTERONE 50 MG/5GM (1%) TD GEL: TRANSDERMAL | Status: DC

## 2015-08-31 ENCOUNTER — Telehealth: Payer: Self-pay | Admitting: *Deleted

## 2015-08-31 MED ORDER — TESTOSTERONE 50 MG/5GM (1%) TD GEL: TRANSDERMAL | Status: DC

## 2015-08-31 NOTE — Telephone Encounter (Signed)
Left msg on triage stating he is needing refill on his Andro Gel 1% [facility] had sent request but never receive response back....Alex Kemp

## 2015-08-31 NOTE — Telephone Encounter (Signed)
This is the first request I have seen  Done hardcopy to Alex Kemp

## 2015-08-31 NOTE — Telephone Encounter (Signed)
Medication has been sent to pharmacy.  °

## 2015-09-05 NOTE — Telephone Encounter (Signed)
Patient called to advise that androgel needs PA via [facility]. Please follow up for PA.

## 2015-09-05 NOTE — Telephone Encounter (Signed)
Have you seen anything on this patient regarding a PA?

## 2015-09-06 NOTE — Telephone Encounter (Signed)
I had not received anything regarding PA for this pt.  Contacted [facility] and requesting PA form be faxed to my ATTN

## 2015-09-07 NOTE — Telephone Encounter (Signed)
PA form completed and faxed back to [employer]

## 2015-09-11 NOTE — Telephone Encounter (Signed)
PA APPROVED through 03/06/2016

## 2015-09-11 NOTE — Telephone Encounter (Signed)
Pharmacy advised via insurance company per [employer]

## 2015-09-14 DIAGNOSIS — N138 Other obstructive and reflux uropathy: Secondary | ICD-10-CM | POA: Diagnosis not present

## 2015-09-14 DIAGNOSIS — E291 Testicular hypofunction: Secondary | ICD-10-CM | POA: Diagnosis not present

## 2015-09-14 DIAGNOSIS — R350 Frequency of micturition: Secondary | ICD-10-CM | POA: Diagnosis not present

## 2015-09-14 DIAGNOSIS — N401 Enlarged prostate with lower urinary tract symptoms: Secondary | ICD-10-CM | POA: Diagnosis not present

## 2015-10-13 ENCOUNTER — Telehealth: Payer: Self-pay | Admitting: *Deleted

## 2015-10-13 MED ORDER — TEMAZEPAM 30 MG PO CAPS: 30.0000 mg | ORAL_CAPSULE | Freq: Every day | ORAL | Status: DC

## 2015-10-13 NOTE — Telephone Encounter (Signed)
Left msg on triage stating [facility] has been trying to get his Temazepam refill have not had response back. Only have a week left pls advise on refill...Alex Kemp

## 2015-10-13 NOTE — Telephone Encounter (Signed)
Faxed script to [facility]...Alex Kemp

## 2015-10-31 ENCOUNTER — Other Ambulatory Visit: Payer: Self-pay | Admitting: *Deleted

## 2015-10-31 MED ORDER — PANTOPRAZOLE SODIUM 40 MG PO TBEC: 40.0000 mg | DELAYED_RELEASE_TABLET | Freq: Two times a day (BID) | ORAL | Status: DC

## 2015-12-26 ENCOUNTER — Encounter: Payer: Federal, State, Local not specified - PPO | Admitting: Internal Medicine

## 2016-01-03 ENCOUNTER — Other Ambulatory Visit (INDEPENDENT_AMBULATORY_CARE_PROVIDER_SITE_OTHER): Payer: Medicare Other

## 2016-01-03 ENCOUNTER — Ambulatory Visit (INDEPENDENT_AMBULATORY_CARE_PROVIDER_SITE_OTHER): Payer: Medicare Other | Admitting: Internal Medicine

## 2016-01-03 VITALS — BP 112/64 | HR 87 | Temp 97.8°F | Resp 16 | Ht 72.0 in | Wt 262.0 lb

## 2016-01-03 DIAGNOSIS — R972 Elevated prostate specific antigen [PSA]: Secondary | ICD-10-CM

## 2016-01-03 DIAGNOSIS — I10 Essential (primary) hypertension: Secondary | ICD-10-CM | POA: Diagnosis not present

## 2016-01-03 DIAGNOSIS — E785 Hyperlipidemia, unspecified: Secondary | ICD-10-CM | POA: Diagnosis not present

## 2016-01-03 DIAGNOSIS — M519 Unspecified thoracic, thoracolumbar and lumbosacral intervertebral disc disorder: Secondary | ICD-10-CM | POA: Diagnosis not present

## 2016-01-03 LAB — CBC WITH DIFFERENTIAL/PLATELET
BASOS PCT: 0.4 % (ref 0.0–3.0)
Basophils Absolute: 0 10*3/uL (ref 0.0–0.1)
Eosinophils Absolute: 0.3 10*3/uL (ref 0.0–0.7)
Eosinophils Relative: 3.5 % (ref 0.0–5.0)
HCT: 43.7 % (ref 39.0–52.0)
Hemoglobin: 14.8 g/dL (ref 13.0–17.0)
LYMPHS PCT: 29.7 % (ref 12.0–46.0)
Lymphs Abs: 2.3 10*3/uL (ref 0.7–4.0)
MCHC: 33.9 g/dL (ref 30.0–36.0)
MCV: 84.5 fl (ref 78.0–100.0)
MONO ABS: 0.8 10*3/uL (ref 0.1–1.0)
Monocytes Relative: 9.8 % (ref 3.0–12.0)
NEUTROS PCT: 56.6 % (ref 43.0–77.0)
Neutro Abs: 4.4 10*3/uL (ref 1.4–7.7)
PLATELETS: 217 10*3/uL (ref 150.0–400.0)
RBC: 5.17 Mil/uL (ref 4.22–5.81)
RDW: 15 % (ref 11.5–15.5)
WBC: 7.9 10*3/uL (ref 4.0–10.5)

## 2016-01-03 LAB — URINALYSIS, ROUTINE W REFLEX MICROSCOPIC
Bilirubin Urine: NEGATIVE
HGB URINE DIPSTICK: NEGATIVE
Ketones, ur: NEGATIVE
Leukocytes, UA: NEGATIVE
NITRITE: NEGATIVE
RBC / HPF: NONE SEEN (ref 0–?)
Specific Gravity, Urine: 1.01 (ref 1.000–1.030)
TOTAL PROTEIN, URINE-UPE24: NEGATIVE
Urine Glucose: NEGATIVE
Urobilinogen, UA: 0.2 (ref 0.0–1.0)
pH: 6 (ref 5.0–8.0)

## 2016-01-03 LAB — BASIC METABOLIC PANEL
BUN: 13 mg/dL (ref 6–23)
CHLORIDE: 104 meq/L (ref 96–112)
CO2: 31 mEq/L (ref 19–32)
CREATININE: 1.33 mg/dL (ref 0.40–1.50)
Calcium: 9.4 mg/dL (ref 8.4–10.5)
GFR: 55.84 mL/min — ABNORMAL LOW (ref >60.00)
GLUCOSE: 77 mg/dL (ref 70–99)
POTASSIUM: 4.4 meq/L (ref 3.5–5.1)
Sodium: 139 mEq/L (ref 135–145)

## 2016-01-03 LAB — HEPATIC FUNCTION PANEL
ALBUMIN: 4.2 g/dL (ref 3.5–5.2)
ALT: 31 U/L (ref 0–53)
AST: 22 U/L (ref 0–37)
Alkaline Phosphatase: 107 U/L (ref 39–117)
BILIRUBIN TOTAL: 0.5 mg/dL (ref 0.2–1.2)
Bilirubin, Direct: 0.1 mg/dL (ref 0.0–0.3)
Total Protein: 6.8 g/dL (ref 6.0–8.3)

## 2016-01-03 LAB — TSH: TSH: 3.16 u[IU]/mL (ref 0.35–4.50)

## 2016-01-03 LAB — LIPID PANEL
CHOLESTEROL: 173 mg/dL (ref 0–200)
HDL: 52.6 mg/dL (ref >39.00)
LDL Cholesterol: 81 mg/dL (ref 0–99)
NonHDL: 120.12
TRIGLYCERIDES: 195 mg/dL — AB (ref 0.0–149.0)
Total CHOL/HDL Ratio: 3
VLDL: 39 mg/dL (ref 0.0–40.0)

## 2016-01-03 LAB — PSA: PSA: 6.58 ng/mL — ABNORMAL HIGH (ref 0.10–4.00)

## 2016-01-03 MED ORDER — PANTOPRAZOLE SODIUM 40 MG PO TBEC: 40.0000 mg | DELAYED_RELEASE_TABLET | Freq: Two times a day (BID) | ORAL | 3 refills | Status: DC

## 2016-01-03 MED ORDER — MELOXICAM 7.5 MG PO TABS: 7.5000 mg | ORAL_TABLET | Freq: Every day | ORAL | 3 refills | Status: DC

## 2016-01-03 MED ORDER — VALSARTAN 80 MG PO TABS: 80.0000 mg | ORAL_TABLET | Freq: Every day | ORAL | 3 refills | Status: DC

## 2016-01-03 MED ORDER — TEMAZEPAM 30 MG PO CAPS: 30.0000 mg | ORAL_CAPSULE | Freq: Every day | ORAL | 3 refills | Status: DC

## 2016-01-03 MED ORDER — SIMVASTATIN 40 MG PO TABS: 40.0000 mg | ORAL_TABLET | Freq: Every evening | ORAL | 3 refills | Status: DC

## 2016-01-03 NOTE — Patient Instructions (Signed)
Please take all new medication as prescribed -the low dose mobic  Please continue all other medications as before, and refills have been done if requested.  Please have the pharmacy call with any other refills you may need.  Please continue your efforts at being more active, low cholesterol diet, and weight control.  You are otherwise up to date with prevention measures today.  Please keep your appointments with your specialists as you may have planned  Please go to the LAB in the Basement (turn left off the elevator) for the tests to be done today  You will be contacted by phone if any changes need to be made immediately.  Otherwise, you will receive a letter about your results with an explanation, but please check with MyChart first.  Please remember to sign up for MyChart if you have not done so, as this will be important to you in the future with finding out test results, communicating by private email, and scheduling acute appointments online when needed.  Please return in 1 year for your yearly visit, or sooner if needed

## 2016-01-03 NOTE — Progress Notes (Signed)
Pre visit review using our clinic review tool, if applicable. No additional management support is needed unless otherwise documented below in the visit note. 

## 2016-01-03 NOTE — Assessment & Plan Note (Signed)
stable overall by history and exam, recent data reviewed with pt, and pt to continue medical treatment as before,  to f/u any worsening symptoms or concerns Lab Results  Component Value Date   LDLCALC 85 12/21/2013    

## 2016-01-03 NOTE — Progress Notes (Signed)
Subjective:    Patient ID: Alex Kemp, male    DOB: Feb 21, 1941, 75 y.o.   MRN: [id number]  HPI  Here for yearly f/u;  Overall doing ok;  Pt denies Chest pain, worsening SOB, DOE, wheezing, orthopnea, PND, worsening LE edema, palpitations, dizziness or syncope.  Pt denies neurological change such as new headache, facial or extremity weakness.  Pt denies polydipsia, polyuria, or low sugar symptoms. Pt states overall good compliance with treatment and medications, good tolerability, and has been trying to follow appropriate diet.  Pt denies worsening depressive symptoms, suicidal ideation or panic. No fever, night sweats, wt loss, loss of appetite, or other constitutional symptoms.  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  Asks for low dose mobic since this has helped his friends.  Denies urinary symptoms such as dysuria, frequency, urgency, flank pain, hematuria or n/v, fever, chills. -Sees Dr Jeffie Pollock for urology/ Past Medical History:  Diagnosis Date  . Corneal injury   . GERD (gastroesophageal reflux disease)   . Headache(784.0)   . Hx of colonic polyps   . Hyperlipidemia   . Hypertension   . Insomnia    Past Surgical History:  Procedure Laterality Date  . corneal injury    . dental plate    . lt are fracture    . NASAL SINUS SURGERY    . panedoscopy  04/23/1999  . PENILE PROSTHESIS IMPLANT    . refreactory surgery to eyes    . rt shoulder surgery      reports that he quit smoking about 37 years ago. He does not have any smokeless tobacco history on file. He reports that he drinks  alcohol. He reports that he does not use drugs. family history includes Arthritis in his other. Allergies  Allergen Reactions  . Penicillins Other (See Comments)    Reaction as a 4 or 75 year old   Current Outpatient Prescriptions on File Prior to Visit  Medication Sig Dispense Refill  . alfuzosin (UROXATRAL) 10 MG 24 hr tablet TAKE 1 TABLET EVERY EVENING 90 tablet 3  . aspirin EC 81 MG tablet Take 81 mg by mouth daily with lunch.    . b complex vitamins tablet Take 1 tablet by mouth daily with lunch.    . Coenzyme Q10 (COQ10 PO) Take 1 tablet by mouth daily with lunch.    . diclofenac (VOLTAREN) 75 MG EC tablet Take 75 mg by mouth 2 (two) times daily.    . Glucosamine HCl (GLUCOSAMINE PO) Take 1 tablet by mouth daily with lunch.    . loperamide (IMODIUM) 2 MG capsule Take 2 mg by mouth 2 (two) times daily as needed for diarrhea or loose stools.     . Multiple Vitamin (MULTIVITAMIN WITH MINERALS) TABS tablet Take 1 tablet by mouth daily with lunch.    . Omega-3 Fatty Acids (FISH OIL PO) Take 1 capsule by mouth daily with lunch.    . pantoprazole (PROTONIX) 40 MG tablet Take 1 tablet (40 mg total) by mouth 2 (two) times daily. Yearly physical due in July must see MD for  refills 90 tablet 0  . Polyvinyl Alcohol-Povidone (REFRESH OP) Place 1 drop into both eyes 2 (two) times daily.    . Probiotic Product (PROBIOTIC FORMULA PO) Take 1 capsule by mouth daily with lunch.     [name] rOPINIRole (REQUIP) 1 MG tablet TAKE 1 TABLET DAILY AT     BEDTIME 90 tablet 3  . scopolamine (TRANSDERM-SCOP, 1.5 MG,) 1 MG/3DAYS Place 1 patch (1.5 mg total) onto the skin every 3 (three) days. 10 patch 1  . simvastatin (ZOCOR) 40 MG tablet TAKE 1 TABLET EVERY EVENING 90 tablet 3  . solifenacin (VESICARE) 10 MG tablet Take 1 tablet (10 mg total) by mouth daily with lunch. 90 tablet 3  . temazepam (RESTORIL) 30 MG capsule Take 1 capsule (30 mg total) by mouth at bedtime. 90 capsule 1  . testosterone (ANDROGEL) 50 MG/5GM  (1%) GEL 2 packets (10 g) per day use as directed 180 Package 1  . valsartan (DIOVAN) 80 MG tablet TAKE 1 TABLET DAILY 90 tablet 2   No current facility-administered medications on file prior to visit.    Review of Systems Constitutional: Negative for increased diaphoresis, or other activity, appetite or siginficant weight change other than noted HENT: Negative for worsening hearing loss, ear pain, facial swelling, mouth sores and neck stiffness.   Eyes: Negative for other worsening pain, redness or visual disturbance.  Respiratory: Negative for choking or stridor Cardiovascular: Negative for other chest pain and palpitations.  Gastrointestinal: Negative for worsening diarrhea, blood in stool, or abdominal distention Genitourinary: Negative for hematuria, flank pain or change in urine volume.  Musculoskeletal: Negative for myalgias or other joint complaints.  Skin: Negative for other color change and wound or drainage.  Neurological: Negative for syncope and numbness. other than noted Hematological: Negative for adenopathy. or other swelling Psychiatric/Behavioral: Negative for hallucinations, SI, self-injury, decreased concentration or other worsening agitation.      Objective:   Physical Exam BP 112/64   Pulse 87   Temp 97.8 F (36.6 C) (Oral)   Resp 16   Ht 6' (1.829 m)   Wt 262 lb (118.8 kg)   SpO2 98%   BMI 35.53 kg/m  VS noted, obese Constitutional: Pt is oriented to person, place, and time. Appears well-developed and well-nourished, in no significant distress Head: Normocephalic and atraumatic  Eyes: Conjunctivae and EOM are normal. Pupils are equal, round, and reactive to light Right Ear: External ear normal.  Left Ear: External ear normal Nose: Nose normal.  Mouth/Throat: Oropharynx is clear and moist  Neck: Normal range of motion. Neck supple. No JVD present. No tracheal deviation present or significant neck LA or mass Cardiovascular: Normal rate, regular rhythm,  normal heart sounds and intact distal pulses.   Pulmonary/Chest: Effort normal and breath sounds without rales or wheezing  Abdominal: Soft. Bowel sounds are normal. NT. No HSM  Musculoskeletal: Normal range of motion. Exhibits no edema Lymphadenopathy: Has no cervical adenopathy.  Neurological: Pt is alert and oriented to person, place, and time. Pt has normal reflexes. No cranial nerve deficit. Motor grossly intact Spine nontender Skin: Skin is warm and dry. No rash noted or new ulcers Psychiatric:  Has normal mood and affect. Behavior is normal.      Assessment & Plan:

## 2016-01-03 NOTE — Assessment & Plan Note (Signed)
Chronic persistent, for low dose mobic prn

## 2016-01-03 NOTE — Assessment & Plan Note (Signed)
stable overall by history and exam, recent data reviewed with pt, and pt to continue medical treatment as before,  to f/u any worsening symptoms or concerns BP Readings from Last 3 Encounters:  01/03/16 112/64  12/22/14 124/66  06/23/14 112/78

## 2016-01-03 NOTE — Assessment & Plan Note (Signed)
Asympt, for f/u psa today 

## 2016-01-09 DIAGNOSIS — Z23 Encounter for immunization: Secondary | ICD-10-CM | POA: Diagnosis not present

## 2016-01-10 ENCOUNTER — Other Ambulatory Visit: Payer: Self-pay | Admitting: Internal Medicine

## 2016-02-20 DIAGNOSIS — Z85828 Personal history of other malignant neoplasm of skin: Secondary | ICD-10-CM | POA: Diagnosis not present

## 2016-02-20 DIAGNOSIS — L570 Actinic keratosis: Secondary | ICD-10-CM | POA: Diagnosis not present

## 2016-02-20 DIAGNOSIS — D2362 Other benign neoplasm of skin of left upper limb, including shoulder: Secondary | ICD-10-CM | POA: Diagnosis not present

## 2016-02-20 DIAGNOSIS — L821 Other seborrheic keratosis: Secondary | ICD-10-CM | POA: Diagnosis not present

## 2016-02-20 DIAGNOSIS — L814 Other melanin hyperpigmentation: Secondary | ICD-10-CM | POA: Diagnosis not present

## 2016-02-20 DIAGNOSIS — L438 Other lichen planus: Secondary | ICD-10-CM | POA: Diagnosis not present

## 2016-02-20 DIAGNOSIS — D485 Neoplasm of uncertain behavior of skin: Secondary | ICD-10-CM | POA: Diagnosis not present

## 2016-02-20 DIAGNOSIS — D1801 Hemangioma of skin and subcutaneous tissue: Secondary | ICD-10-CM | POA: Diagnosis not present

## 2016-02-29 ENCOUNTER — Telehealth: Payer: Self-pay | Admitting: *Deleted

## 2016-02-29 MED ORDER — TESTOSTERONE 50 MG/5GM (1%) TD GEL: TRANSDERMAL | 1 refills | Status: DC

## 2016-02-29 NOTE — Telephone Encounter (Signed)
Rec'd fax pt requesting refill on andro Gel...Johny Chess

## 2016-02-29 NOTE — Telephone Encounter (Signed)
Done hardcopy to Corinne  

## 2016-03-18 NOTE — Telephone Encounter (Signed)
Pt called stating that Andro Gel need PA on it and [facility] is the pharmacy. Please help, he has been trying to get this med for a month now and he has 1 week supply left.

## 2016-03-18 NOTE — Telephone Encounter (Signed)
PA APPROVED until 03/18/2016, pt advised of same

## 2016-03-18 NOTE — Telephone Encounter (Signed)
PA initiated via CoverMyMeds key [id number]

## 2016-03-19 ENCOUNTER — Telehealth: Payer: Self-pay

## 2016-03-19 MED ORDER — TESTOSTERONE 50 MG/5GM (1%) TD GEL: TRANSDERMAL | 1 refills | Status: DC

## 2016-03-19 NOTE — Telephone Encounter (Signed)
[facility] not [facility], but they do have the PA. Please call in script to [facility]

## 2016-03-19 NOTE — Telephone Encounter (Signed)
Medication sent to [facility]

## 2016-04-11 DIAGNOSIS — H43812 Vitreous degeneration, left eye: Secondary | ICD-10-CM | POA: Diagnosis not present

## 2016-04-11 DIAGNOSIS — H04123 Dry eye syndrome of bilateral lacrimal glands: Secondary | ICD-10-CM | POA: Diagnosis not present

## 2016-04-11 DIAGNOSIS — H25813 Combined forms of age-related cataract, bilateral: Secondary | ICD-10-CM | POA: Diagnosis not present

## 2016-04-11 DIAGNOSIS — H5213 Myopia, bilateral: Secondary | ICD-10-CM | POA: Diagnosis not present

## 2016-05-21 ENCOUNTER — Ambulatory Visit (INDEPENDENT_AMBULATORY_CARE_PROVIDER_SITE_OTHER): Payer: Medicare Other | Admitting: Internal Medicine

## 2016-05-21 ENCOUNTER — Encounter: Payer: Self-pay | Admitting: Internal Medicine

## 2016-05-21 VITALS — BP 138/80 | HR 76 | Temp 98.8°F | Resp 20 | Wt 258.0 lb

## 2016-05-21 DIAGNOSIS — R059 Cough, unspecified: Secondary | ICD-10-CM

## 2016-05-21 DIAGNOSIS — R05 Cough: Secondary | ICD-10-CM | POA: Diagnosis not present

## 2016-05-21 DIAGNOSIS — G4700 Insomnia, unspecified: Secondary | ICD-10-CM | POA: Diagnosis not present

## 2016-05-21 DIAGNOSIS — N3281 Overactive bladder: Secondary | ICD-10-CM | POA: Diagnosis not present

## 2016-05-21 MED ORDER — TOLTERODINE TARTRATE ER 4 MG PO CP24: 4.0000 mg | ORAL_CAPSULE | Freq: Every day | ORAL | 3 refills | Status: DC

## 2016-05-21 MED ORDER — LEVOFLOXACIN 500 MG PO TABS: 500.0000 mg | ORAL_TABLET | Freq: Every day | ORAL | 0 refills | Status: AC

## 2016-05-21 MED ORDER — TEMAZEPAM 30 MG PO CAPS: 30.0000 mg | ORAL_CAPSULE | Freq: Every evening | ORAL | 3 refills | Status: DC | PRN

## 2016-05-21 NOTE — Assessment & Plan Note (Signed)
Insurance no longer covers vesicare, ok for change to detrol LA 4 qd,  to f/u any worsening symptoms or concerns

## 2016-05-21 NOTE — Progress Notes (Signed)
Pre visit review using our clinic review tool, if applicable. No additional management support is needed unless otherwise documented below in the visit note. 

## 2016-05-21 NOTE — Progress Notes (Signed)
Subjective:    Patient ID: Alex Kemp, male    DOB: [birth year], 75 y.o.   MRN: [id number]  HPI  Here with acute onset mild to mod 2-3 days ST, HA, general weakness and malaise, with prod cough greenish sputum, but Pt denies chest pain, increased sob or doe, wheezing, orthopnea, PND, increased LE swelling, palpitations, dizziness or syncope. Pt denies new neurological symptoms such as new headache, or facial or extremity weakness or numbness   Pt denies polydipsia, polyuria, but does have ongoing OAB symptoms, with insurance no longer covering vesicare.  Past Medical History:  Diagnosis Date  . Corneal injury   . GERD (gastroesophageal reflux disease)   . Headache(784.0)   . Hx of colonic polyps   . Hyperlipidemia   . Hypertension   . Insomnia    Past Surgical History:  Procedure Laterality Date  . corneal injury    . dental plate    . lt are fracture    . NASAL SINUS SURGERY    . panedoscopy  04/23/1999  . PENILE PROSTHESIS IMPLANT    . refreactory surgery to eyes    . rt shoulder surgery      reports that he quit smoking about 37 years ago. He does not have any smokeless tobacco history on file. He reports that he drinks alcohol. He reports that he does not use drugs. family history includes Arthritis in his other. Allergies  Allergen Reactions  . Penicillins Other (See Comments)    Reaction as a 75 or 75 year old   Current Outpatient Prescriptions on File Prior to Visit  Medication Sig Dispense Refill  . alfuzosin (UROXATRAL) 10 MG 24 hr tablet TAKE 1 TABLET EVERY EVENING 90 tablet 3  . aspirin EC 81 MG tablet Take 81 mg by mouth daily with lunch.    . b complex vitamins tablet Take 1 tablet by mouth daily with lunch.    . Coenzyme Q10 (COQ10 PO) Take 1 tablet by mouth daily with lunch.    . diclofenac (VOLTAREN) 75 MG EC tablet Take 75 mg by mouth 2 (two) times daily.    . Glucosamine HCl (GLUCOSAMINE PO) Take 1 tablet by mouth daily with lunch.    . loperamide (IMODIUM) 2  MG capsule Take 2 mg by mouth 2 (two) times daily as needed for diarrhea or loose stools.     . meloxicam (MOBIC) 7.5 MG tablet Take 1 tablet (7.5 mg total) by mouth daily. 90 tablet 3  . Multiple Vitamin (MULTIVITAMIN WITH MINERALS) TABS tablet Take 1 tablet by mouth daily with lunch.    . Omega-3 Fatty Acids (FISH OIL PO) Take 1 capsule by mouth daily with lunch.    . pantoprazole (PROTONIX) 40 MG tablet Take 1 tablet (40 mg total) by mouth 2 (two) times daily. 90 tablet 3  . Polyvinyl Alcohol-Povidone (REFRESH OP) Place 1 drop into both eyes 2 (two) times daily.    . Probiotic Product (PROBIOTIC FORMULA PO) Take 1 capsule by mouth daily with lunch.     [name] rOPINIRole (REQUIP) 1 MG tablet TAKE 1 TABLET AT BEDTIME 90 tablet 3  . scopolamine (TRANSDERM-SCOP, 1.5 MG,) 1 MG/3DAYS Place 1 patch (1.5 mg total) onto the skin every 3 (three) days. 10 patch 1  . simvastatin (ZOCOR) 40 MG tablet Take 1 tablet (40 mg total) by mouth every evening. 90 tablet 3  . testosterone (ANDROGEL) 50 MG/5GM (1%) GEL 2 packets (10 g) per day use as directed 180 Package  1  . valsartan (DIOVAN) 80 MG tablet Take 1 tablet (80 mg total) by mouth daily. 90 tablet 3   No current facility-administered medications on file prior to visit.    Review of Systems  Constitutional: Negative for unusual diaphoresis or night sweats HENT: Negative for ear swelling or discharge Eyes: Negative for worsening visual haziness  Respiratory: Negative for choking and stridor.   Gastrointestinal: Negative for distension or worsening eructation Genitourinary: Negative for retention or change in urine volume.  Musculoskeletal: Negative for other MSK pain or swelling Skin: Negative for color change and worsening wound Neurological: Negative for tremors and numbness other than noted  Psychiatric/Behavioral: Negative for decreased concentration or agitation other than above   All other system neg per pt    Objective:   Physical Exam BP  138/80   Pulse 76   Temp 98.8 F (37.1 C) (Oral)   Resp 20   Wt 258 lb (117 kg)   SpO2 97%   BMI 34.99 kg/m  VS noted,  Constitutional: Pt appears in no apparent distress HENT: Head: NCAT.  Right Ear: External ear normal.  Left Ear: External ear normal.  Eyes: . Pupils are equal, round, and reactive to light. Conjunctivae and EOM are normal Neck: Normal range of motion. Neck supple.  Cardiovascular: Normal rate and regular rhythm.   Pulmonary/Chest: Effort normal and breath sounds decreased bilat with few RLL crackles only,  But no wheezing.  Neurological: Pt is alert. Not confused , motor grossly intact Skin: Skin is warm. No rash, no LE edema Psychiatric: Pt behavior is normal. No agitation.  No other new exam findings    Assessment & Plan:

## 2016-05-21 NOTE — Assessment & Plan Note (Addendum)
Mild to mod, c/w bronchitis vs pna, for tomorrow cxr as closed at this time, for antibx course, otc delsym prn cough, to f/u any worsening symptoms or concerns

## 2016-05-21 NOTE — Patient Instructions (Signed)
Please take all new medication as prescribed - the antibiotic ([facility])  OK to change the vesicare to detrol LA - this was sent to [facility]  Please go to the XRAY Department in the Basement (go straight as you get off the elevator) for the x-ray testing tomorrow  Please continue all other medications as before, and refills have been done if requested - the temazepam  Please have the pharmacy call with any other refills you may need.  Please continue your efforts at being more active, low cholesterol diet, and weight control.  Please keep your appointments with your specialists as you may have planned

## 2016-05-21 NOTE — Assessment & Plan Note (Signed)
Chronic persistent, Mild to mod, for med refill,,  to f/u any worsening symptoms or concerns

## 2016-05-22 ENCOUNTER — Ambulatory Visit (INDEPENDENT_AMBULATORY_CARE_PROVIDER_SITE_OTHER)
Admission: RE | Admit: 2016-05-22 | Discharge: 2016-05-22 | Disposition: A | Discharge status: Home / Self Care | Payer: Medicare Other | Source: Ambulatory Visit | Attending: Internal Medicine | Admitting: Internal Medicine

## 2016-05-22 DIAGNOSIS — R05 Cough: Secondary | ICD-10-CM | POA: Diagnosis not present

## 2016-05-22 DIAGNOSIS — R059 Cough, unspecified: Secondary | ICD-10-CM

## 2016-06-18 ENCOUNTER — Telehealth: Payer: Self-pay | Admitting: *Deleted

## 2016-06-18 MED ORDER — TESTOSTERONE 50 MG/5GM (1%) TD GEL: 10.0000 g | Freq: Every day | TRANSDERMAL | 1 refills | Status: DC

## 2016-06-18 NOTE — Telephone Encounter (Signed)
faxed

## 2016-06-18 NOTE — Telephone Encounter (Signed)
Done hardcopy to Corinne  

## 2016-06-18 NOTE — Telephone Encounter (Signed)
Rec'd fax stating the medication Androgel 1% pak 50 mg is on manufacturer back order. Checking to see if its ok to change to Testost (V) PAk 50mg  1% (generic Vogelxo. If ok pls send new rx....Johny Chess

## 2016-06-19 MED ORDER — TESTOSTERONE 50 MG/5GM (1%) TD GEL: 10.0000 g | Freq: Every day | TRANSDERMAL | 1 refills | Status: DC

## 2016-06-19 NOTE — Addendum Note (Signed)
Addended by: Earnstine Regal on: 06/19/2016 11:09 AM   Modules accepted: Orders

## 2016-06-19 NOTE — Telephone Encounter (Signed)
Rec'd call from [facility] stating rec'd fax for Testosterone (Andro Gel) due to med not being available requesting verbal to change to generic Vogelxo. Inform pharmacist/Aaron ok to change. Will update chart...Johny Chess

## 2016-06-21 ENCOUNTER — Other Ambulatory Visit: Payer: Self-pay | Admitting: *Deleted

## 2016-06-21 MED ORDER — PANTOPRAZOLE SODIUM 40 MG PO TBEC: 40.0000 mg | DELAYED_RELEASE_TABLET | Freq: Two times a day (BID) | ORAL | 3 refills | Status: DC

## 2016-07-15 ENCOUNTER — Ambulatory Visit (INDEPENDENT_AMBULATORY_CARE_PROVIDER_SITE_OTHER): Payer: Medicare Other | Admitting: Internal Medicine

## 2016-07-15 ENCOUNTER — Encounter: Payer: Self-pay | Admitting: Internal Medicine

## 2016-07-15 DIAGNOSIS — R059 Cough, unspecified: Secondary | ICD-10-CM

## 2016-07-15 DIAGNOSIS — R05 Cough: Secondary | ICD-10-CM | POA: Diagnosis not present

## 2016-07-15 MED ORDER — HYDROCODONE-HOMATROPINE 5-1.5 MG/5ML PO SYRP: 5.0000 mL | ORAL_SOLUTION | Freq: Three times a day (TID) | ORAL | 0 refills | Status: DC | PRN

## 2016-07-15 MED ORDER — SALINE SPRAY 0.65 % NA SOLN: 1.0000 | NASAL | 0 refills | Status: DC | PRN

## 2016-07-15 NOTE — Progress Notes (Signed)
Pre visit review using our clinic review tool, if applicable. No additional management support is needed unless otherwise documented below in the visit note. 

## 2016-07-15 NOTE — Assessment & Plan Note (Signed)
Likely viral URI and he will continue zyrtec and rx for hycodan for the cough and as well saline nose spray to help with congestion.

## 2016-07-15 NOTE — Progress Notes (Signed)
   Subjective:    Patient ID: Alex Kemp, male    DOB: 01-04-1941, 76 y.o.   MRN: [id number]  HPI The patient is a 76 YO man coming in for 4 days of cough. He is having also nasal and sinus congestion. Denies fevers or chills. Stable since onset. Taking zyrtec daily. He does have sick contact with his wife having same symptoms about 1-2 weeks ago. He is coughing a lot with trouble sleeping. Some crusting and weeping of his eyes and no redness in his eyes. No fevers or chills. No muscle aches or headaches. No SOB with exertion.   Review of Systems  Constitutional: Positive for activity change and fatigue. Negative for appetite change, chills, fever and unexpected weight change.  HENT: Positive for congestion and rhinorrhea. Negative for ear discharge, ear pain, postnasal drip, sinus pain, sinus pressure, sore throat and trouble swallowing.   Eyes: Positive for discharge. Negative for photophobia, pain, redness, itching and visual disturbance.  Respiratory: Negative for cough, chest tightness, shortness of breath and wheezing.   Cardiovascular: Negative.   Gastrointestinal: Negative.   Musculoskeletal: Negative.   Neurological: Negative.       Objective:   Physical Exam  Constitutional: He is oriented to person, place, and time. He appears well-developed and well-nourished.  HENT:  Head: Normocephalic and atraumatic.  TMs normal, oropharynx with redness and clear drainage, no sinus pressure.   Eyes: EOM are normal.  Neck: Normal range of motion. No JVD present.  Cardiovascular: Normal rate and regular rhythm.   Pulmonary/Chest: Effort normal. No respiratory distress. He has no wheezes. He has no rales.  Abdominal: Soft. He exhibits no distension. There is no tenderness. There is no rebound.  Lymphadenopathy:    He has no cervical adenopathy.  Neurological: He is alert and oriented to person, place, and time.  Skin: Skin is warm and dry.   Vitals:   07/15/16 1115  BP: 136/76  Pulse:  85  Temp: 97.7 F (36.5 C)  TempSrc: Oral  SpO2: 99%  Weight: 258 lb (117 kg)  Height: 6' (1.829 m)      Assessment & Plan:

## 2016-07-15 NOTE — Patient Instructions (Signed)
We have sent in the nose spray to use as needed up to 6 times per day.  We have also given you the cough syrup medicine to use as needed for the cough.    Upper Respiratory Infection, Adult Most upper respiratory infections (URIs) are a viral infection of the air passages leading to the lungs. A URI affects the nose, throat, and upper air passages. The most common type of URI is nasopharyngitis and is typically referred to as "the common cold." URIs run their course and usually go away on their own. Most of the time, a URI does not require medical attention, but sometimes a bacterial infection in the upper airways can follow a viral infection. This is called a secondary infection. Sinus and middle ear infections are common types of secondary upper respiratory infections. Bacterial pneumonia can also complicate a URI. A URI can worsen asthma and chronic obstructive pulmonary disease (COPD). Sometimes, these complications can require emergency medical care and may be life threatening. What are the causes? Almost all URIs are caused by viruses. A virus is a type of germ and can spread from one person to another. What increases the risk? You may be at risk for a URI if:  You smoke.  You have chronic heart or lung disease.  You have a weakened defense (immune) system.  You are very young or very old.  You have nasal allergies or asthma.  You work in crowded or poorly ventilated areas.  You work in health care facilities or schools. What are the signs or symptoms? Symptoms typically develop 2-3 days after you come in contact with a cold virus. Most viral URIs last 7-10 days. However, viral URIs from the influenza virus (flu virus) can last 14-18 days and are typically more severe. Symptoms may include:  Runny or stuffy (congested) nose.  Sneezing.  Cough.  Sore throat.  Headache.  Fatigue.  Fever.  Loss of appetite.  Pain in your forehead, behind your eyes, and over your  cheekbones (sinus pain).  Muscle aches. How is this diagnosed? Your health care provider may diagnose a URI by:  Physical exam.  Tests to check that your symptoms are not due to another condition such as:  Strep throat.  Sinusitis.  Pneumonia.  Asthma. How is this treated? A URI goes away on its own with time. It cannot be cured with medicines, but medicines may be prescribed or recommended to relieve symptoms. Medicines may help:  Reduce your fever.  Reduce your cough.  Relieve nasal congestion. Follow these instructions at home:  Take medicines only as directed by your health care provider.  Gargle warm saltwater or take cough drops to comfort your throat as directed by your health care provider.  Use a warm mist humidifier or inhale steam from a shower to increase air moisture. This may make it easier to breathe.  Drink enough fluid to keep your urine clear or pale yellow.  Eat soups and other clear broths and maintain good nutrition.  Rest as needed.  Return to work when your temperature has returned to normal or as your health care provider advises. You may need to stay home longer to avoid infecting others. You can also use a face mask and careful hand washing to prevent spread of the virus.  Increase the usage of your inhaler if you have asthma.  Do not use any tobacco products, including cigarettes, chewing tobacco, or electronic cigarettes. If you need help quitting, ask your health care provider.  How is this prevented? The best way to protect yourself from getting a cold is to practice good hygiene.  Avoid oral or hand contact with people with cold symptoms.  Wash your hands often if contact occurs. There is no clear evidence that vitamin C, vitamin E, echinacea, or exercise reduces the chance of developing a cold. However, it is always recommended to get plenty of rest, exercise, and practice good nutrition. Contact a health care provider if:  You are  getting worse rather than better.  Your symptoms are not controlled by medicine.  You have chills.  You have worsening shortness of breath.  You have brown or red mucus.  You have yellow or brown nasal discharge.  You have pain in your face, especially when you bend forward.  You have a fever.  You have swollen neck glands.  You have pain while swallowing.  You have white areas in the back of your throat. Get help right away if:  You have severe or persistent:  Headache.  Ear pain.  Sinus pain.  Chest pain.  You have chronic lung disease and any of the following:  Wheezing.  Prolonged cough.  Coughing up blood.  A change in your usual mucus.  You have a stiff neck.  You have changes in your:  Vision.  Hearing.  Thinking.  Mood. This information is not intended to replace advice given to you by your health care provider. Make sure you discuss any questions you have with your health care provider. Document Released: 11/06/2000 Document Revised: 01/14/2016 Document Reviewed: 08/18/2013 Elsevier Interactive Patient Education  2017 [employer].

## 2016-07-19 ENCOUNTER — Other Ambulatory Visit: Payer: Self-pay | Admitting: Family Medicine

## 2016-07-19 ENCOUNTER — Telehealth: Payer: Self-pay | Admitting: Internal Medicine

## 2016-07-19 MED ORDER — AZITHROMYCIN 250 MG PO TABS: ORAL_TABLET | ORAL | 0 refills | Status: DC

## 2016-07-19 NOTE — Telephone Encounter (Signed)
Patient called in. He saw Dr. Sharlet Salina last week. He states that she told him if he was not feeling any better by today to give [place] a call. He is still not feeling any better and would like some other medication called in. Please follow up with patient or advise. Thank you.

## 2016-07-19 NOTE — Telephone Encounter (Signed)
LVM with patient with directions on how to take zpack and to call back if he had any questions

## 2016-07-19 NOTE — Telephone Encounter (Signed)
Sent in z-pack. Take 2 pills today, then 1 pill daily until gone.

## 2016-07-28 ENCOUNTER — Other Ambulatory Visit: Payer: Self-pay | Admitting: Internal Medicine

## 2016-10-28 ENCOUNTER — Other Ambulatory Visit: Payer: Self-pay | Admitting: Internal Medicine

## 2016-11-12 ENCOUNTER — Telehealth: Payer: Self-pay

## 2016-11-12 ENCOUNTER — Other Ambulatory Visit: Payer: Self-pay

## 2016-11-12 MED ORDER — TESTOSTERONE 50 MG/5GM (1%) TD GEL: 10.0000 g | Freq: Every day | TRANSDERMAL | 0 refills | Status: DC

## 2016-11-12 NOTE — Telephone Encounter (Signed)
faxed

## 2016-11-12 NOTE — Telephone Encounter (Signed)
Approved 10/09/16-11/08/17 Paper auth request and determination sent to be scanned.

## 2016-11-12 NOTE — Telephone Encounter (Signed)
Done hardcopy to Shirron  

## 2016-12-25 DIAGNOSIS — C4490 Unspecified malignant neoplasm of skin, unspecified: Secondary | ICD-10-CM

## 2016-12-25 HISTORY — DX: Unspecified malignant neoplasm of skin, unspecified: C44.90

## 2016-12-25 HISTORY — PX: SKIN CANCER EXCISION: SHX779

## 2016-12-30 ENCOUNTER — Encounter: Payer: Self-pay | Admitting: Internal Medicine

## 2016-12-30 DIAGNOSIS — R202 Paresthesia of skin: Secondary | ICD-10-CM

## 2016-12-31 MED ORDER — TAMSULOSIN HCL 0.4 MG PO CAPS: 0.4000 mg | ORAL_CAPSULE | Freq: Every day | ORAL | 3 refills | Status: DC

## 2016-12-31 MED ORDER — LOSARTAN POTASSIUM 50 MG PO TABS: 50.0000 mg | ORAL_TABLET | Freq: Every day | ORAL | 3 refills | Status: DC

## 2016-12-31 NOTE — Telephone Encounter (Signed)
Both changes done erx

## 2017-01-01 ENCOUNTER — Telehealth: Payer: Self-pay | Admitting: Internal Medicine

## 2017-01-01 NOTE — Telephone Encounter (Signed)
Called pt regarding appt scheduled on 01/03/17. Pt has Medicare Part A & B, (does not cover CPE) visit type needs to be changed to OV. Will make change once pt confirms. SF

## 2017-01-03 ENCOUNTER — Encounter: Payer: Self-pay | Admitting: Internal Medicine

## 2017-01-03 ENCOUNTER — Ambulatory Visit (INDEPENDENT_AMBULATORY_CARE_PROVIDER_SITE_OTHER): Payer: Medicare Other | Admitting: Internal Medicine

## 2017-01-03 ENCOUNTER — Other Ambulatory Visit (INDEPENDENT_AMBULATORY_CARE_PROVIDER_SITE_OTHER): Payer: Medicare Other

## 2017-01-03 ENCOUNTER — Telehealth: Payer: Self-pay

## 2017-01-03 DIAGNOSIS — M4807 Spinal stenosis, lumbosacral region: Secondary | ICD-10-CM | POA: Diagnosis not present

## 2017-01-03 DIAGNOSIS — I10 Essential (primary) hypertension: Secondary | ICD-10-CM | POA: Diagnosis not present

## 2017-01-03 DIAGNOSIS — M48062 Spinal stenosis, lumbar region with neurogenic claudication: Secondary | ICD-10-CM | POA: Diagnosis not present

## 2017-01-03 DIAGNOSIS — R972 Elevated prostate specific antigen [PSA]: Secondary | ICD-10-CM | POA: Diagnosis not present

## 2017-01-03 DIAGNOSIS — R202 Paresthesia of skin: Secondary | ICD-10-CM | POA: Diagnosis not present

## 2017-01-03 DIAGNOSIS — G9519 Other vascular myelopathies: Secondary | ICD-10-CM

## 2017-01-03 DIAGNOSIS — M79606 Pain in leg, unspecified: Secondary | ICD-10-CM | POA: Insufficient documentation

## 2017-01-03 DIAGNOSIS — E785 Hyperlipidemia, unspecified: Secondary | ICD-10-CM

## 2017-01-03 LAB — URINALYSIS, ROUTINE W REFLEX MICROSCOPIC
Bilirubin Urine: NEGATIVE
HGB URINE DIPSTICK: NEGATIVE
KETONES UR: NEGATIVE
Leukocytes, UA: NEGATIVE
NITRITE: NEGATIVE
SPECIFIC GRAVITY, URINE: 1.025 (ref 1.000–1.030)
Total Protein, Urine: NEGATIVE
UROBILINOGEN UA: 0.2 (ref 0.0–1.0)
Urine Glucose: NEGATIVE
pH: 6 (ref 5.0–8.0)

## 2017-01-03 LAB — CBC WITH DIFFERENTIAL/PLATELET
BASOS PCT: 0.4 % (ref 0.0–3.0)
Basophils Absolute: 0 10*3/uL (ref 0.0–0.1)
Eosinophils Absolute: 0.3 10*3/uL (ref 0.0–0.7)
Eosinophils Relative: 4.3 % (ref 0.0–5.0)
HEMATOCRIT: 41.2 % (ref 39.0–52.0)
HEMOGLOBIN: 13.5 g/dL (ref 13.0–17.0)
LYMPHS PCT: 30.3 % (ref 12.0–46.0)
Lymphs Abs: 1.8 10*3/uL (ref 0.7–4.0)
MCHC: 32.8 g/dL (ref 30.0–36.0)
MCV: 86.8 fl (ref 78.0–100.0)
MONOS PCT: 9.5 % (ref 3.0–12.0)
Monocytes Absolute: 0.6 10*3/uL (ref 0.1–1.0)
NEUTROS ABS: 3.3 10*3/uL (ref 1.4–7.7)
Neutrophils Relative %: 55.5 % (ref 43.0–77.0)
PLATELETS: 180 10*3/uL (ref 150.0–400.0)
RBC: 4.74 Mil/uL (ref 4.22–5.81)
RDW: 16 % — AB (ref 11.5–15.5)
WBC: 5.9 10*3/uL (ref 4.0–10.5)

## 2017-01-03 LAB — LIPID PANEL
CHOL/HDL RATIO: 3
CHOLESTEROL: 143 mg/dL (ref 0–200)
HDL: 46 mg/dL (ref >39.00)
LDL Cholesterol: 63 mg/dL (ref 0–99)
NONHDL: 97.22
TRIGLYCERIDES: 169 mg/dL — AB (ref 0.0–149.0)
VLDL: 33.8 mg/dL (ref 0.0–40.0)

## 2017-01-03 LAB — TSH: TSH: 2.15 u[IU]/mL (ref 0.35–4.50)

## 2017-01-03 LAB — BASIC METABOLIC PANEL
BUN: 16 mg/dL (ref 6–23)
CALCIUM: 8.7 mg/dL (ref 8.4–10.5)
CHLORIDE: 108 meq/L (ref 96–112)
CO2: 28 mEq/L (ref 19–32)
CREATININE: 1.58 mg/dL — AB (ref 0.40–1.50)
GFR: 45.65 mL/min — AB (ref >60.00)
Glucose, Bld: 134 mg/dL — ABNORMAL HIGH (ref 70–99)
Potassium: 4.6 mEq/L (ref 3.5–5.1)
Sodium: 142 mEq/L (ref 135–145)

## 2017-01-03 LAB — HEPATIC FUNCTION PANEL
ALT: 25 U/L (ref 0–53)
AST: 21 U/L (ref 0–37)
Albumin: 4.1 g/dL (ref 3.5–5.2)
Alkaline Phosphatase: 91 U/L (ref 39–117)
BILIRUBIN DIRECT: 0.1 mg/dL (ref 0.0–0.3)
BILIRUBIN TOTAL: 0.5 mg/dL (ref 0.2–1.2)
Total Protein: 6.3 g/dL (ref 6.0–8.3)

## 2017-01-03 LAB — PSA: PSA: 7.81 ng/mL — AB (ref 0.10–4.00)

## 2017-01-03 LAB — VITAMIN B12: Vitamin B-12: 703 pg/mL (ref 211–911)

## 2017-01-03 MED ORDER — SIMVASTATIN 40 MG PO TABS: 40.0000 mg | ORAL_TABLET | Freq: Every evening | ORAL | 3 refills | Status: DC

## 2017-01-03 MED ORDER — TAMSULOSIN HCL 0.4 MG PO CAPS: 0.4000 mg | ORAL_CAPSULE | Freq: Every day | ORAL | 3 refills | Status: DC

## 2017-01-03 MED ORDER — MELOXICAM 7.5 MG PO TABS: 7.5000 mg | ORAL_TABLET | Freq: Every day | ORAL | 3 refills | Status: DC

## 2017-01-03 MED ORDER — LOSARTAN POTASSIUM 50 MG PO TABS: 50.0000 mg | ORAL_TABLET | Freq: Every day | ORAL | 3 refills | Status: DC

## 2017-01-03 MED ORDER — PANTOPRAZOLE SODIUM 40 MG PO TBEC: 40.0000 mg | DELAYED_RELEASE_TABLET | Freq: Two times a day (BID) | ORAL | 3 refills | Status: DC

## 2017-01-03 MED ORDER — ROPINIROLE HCL 1 MG PO TABS: 1.0000 mg | ORAL_TABLET | Freq: Every day | ORAL | 3 refills | Status: DC

## 2017-01-03 NOTE — Progress Notes (Signed)
Subjective:    Patient ID: Alex Kemp, male    DOB: [birth year], 76 y.o.   MRN: [id number]  HPI    Here for yearly f/u;  Overall doing ok;  Pt denies Chest pain, worsening SOB, DOE, wheezing, orthopnea, PND, worsening LE edema, palpitations, or syncope.  Pt denies neurological change such as new headache, facial or extremity weakness.  Pt denies polydipsia, polyuria, or low sugar symptoms. Pt states overall good compliance with treatment and medications, good tolerability, and has been trying to follow appropriate diet.  Pt denies worsening depressive symptoms, suicidal ideation or panic. No fever, night sweats, wt loss, loss of appetite, or other constitutional symptoms.  Pt states good ability with ADL's, has low fall risk, home safety reviewed and adequate, no other significant changes in hearing or vision, and only occasionally active with exercise, been having some dizziness recently and off balance with an episode of bilat feet numbness with walking several blocs approx 1 mo ago.  Happened again with numb feet just standing in a line for about 15 min.  Both lasted maybe 10 min, better to sit or bend forward to take the pressure off hte back, and nothing else seems to make worse.  Also now with numbness of the tip of left middle finger, wondering about damage from DM since he has heard there could be nerve damage.  Pt continues to have recurring midline LBP with raidation to bilat hip areas.  But no bowel or bladder change, fever, wt loss,  worsening LE weakness, or falls, though can really walk only about 10 min (a couple of blocks) before sitting.  After rest, he can stand to walk again.  Difficult to walk about at [facility] and recent [place] trips with wife. Wt Readings from Last 3 Encounters:  01/03/17 261 lb (118.4 kg)  07/15/16 258 lb (117 kg)  05/21/16 258 lb (117 kg)   BP Readings from Last 3 Encounters:  01/03/17 130/80  07/15/16 136/76  05/21/16 138/80   Past Medical History:  Diagnosis  Date  . Corneal injury   . GERD (gastroesophageal reflux disease)   . Headache(784.0)   . Hx of colonic polyps   . Hyperlipidemia   . Hypertension   . Insomnia    Past Surgical History:  Procedure Laterality Date  . corneal injury    . dental plate    . lt are fracture    . NASAL SINUS SURGERY    . panedoscopy  04/23/1999  . PENILE PROSTHESIS IMPLANT    . refreactory surgery to eyes    . rt shoulder surgery      reports that he quit smoking about 38 years ago. He has never used smokeless tobacco. He reports that he drinks alcohol. He reports that he does not use drugs. family history includes Arthritis in his other. Allergies  Allergen Reactions  . Penicillins Other (See Comments)    Reaction as a 44 or 76 year old   Current Outpatient Prescriptions on File Prior to Visit  Medication Sig Dispense Refill  . aspirin EC 81 MG tablet Take 81 mg by mouth daily with lunch.    . b complex vitamins tablet Take 1 tablet by mouth daily with lunch.    . Coenzyme Q10 (COQ10 PO) Take 1 tablet by mouth daily with lunch.    . diclofenac (VOLTAREN) 75 MG EC tablet Take 75 mg by mouth 2 (two) times daily.    . Glucosamine HCl (GLUCOSAMINE PO) Take 1  tablet by mouth daily with lunch.    . loperamide (IMODIUM) 2 MG capsule Take 2 mg by mouth 2 (two) times daily as needed for diarrhea or loose stools.     [name] losartan (COZAAR) 50 MG tablet Take 1 tablet (50 mg total) by mouth daily. 90 tablet 3  . meloxicam (MOBIC) 7.5 MG tablet Take 1 tablet (7.5 mg total) by mouth daily. Annual appt due in august must see MD for refills 90 tablet 0  . Multiple Vitamin (MULTIVITAMIN WITH MINERALS) TABS tablet Take 1 tablet by mouth daily with lunch.    . Omega-3 Fatty Acids (FISH OIL PO) Take 1 capsule by mouth daily with lunch.    . pantoprazole (PROTONIX) 40 MG tablet Take 1 tablet (40 mg total) by mouth 2 (two) times daily. 180 tablet 1  . Polyvinyl Alcohol-Povidone (REFRESH OP) Place 1 drop into both eyes 2  (two) times daily.    . Probiotic Product (PROBIOTIC FORMULA PO) Take 1 capsule by mouth daily with lunch.     [name] rOPINIRole (REQUIP) 1 MG tablet TAKE 1 TABLET AT BEDTIME 90 tablet 3  . simvastatin (ZOCOR) 40 MG tablet Take 1 tablet (40 mg total) by mouth every evening. 90 tablet 3  . tamsulosin (FLOMAX) 0.4 MG CAPS capsule Take 1 capsule (0.4 mg total) by mouth daily. 90 capsule 3  . temazepam (RESTORIL) 30 MG capsule Take 1 capsule (30 mg total) by mouth at bedtime as needed for sleep. 90 capsule 3  . testosterone (VOGELXO) 50 MG/5GM (1%) GEL Place 10 g onto the skin daily. 180 Package 0   No current facility-administered medications on file prior to visit.    Review of Systems Constitutional: Negative for other unusual diaphoresis, sweats, appetite or weight changes HENT: Negative for other worsening hearing loss, ear pain, facial swelling, mouth sores or neck stiffness.   Eyes: Negative for other worsening pain, redness or other visual disturbance.  Respiratory: Negative for other stridor or swelling Cardiovascular: Negative for other palpitations or other chest pain  Gastrointestinal: Negative for worsening diarrhea or loose stools, blood in stool, distention or other pain Genitourinary: Negative for hematuria, flank pain or other change in urine volume.  Musculoskeletal: Negative for myalgias or other joint swelling.  Skin: Negative for other color change, or other wound or worsening drainage.  Neurological: Negative for other syncope or numbness. Hematological: Negative for other adenopathy or swelling Psychiatric/Behavioral: Negative for hallucinations, other worsening agitation, SI, self-injury, or new decreased concentration No other exam findings    Objective:   Physical Exam BP 130/80   Pulse 85   Ht 6' (1.829 m)   Wt 261 lb (118.4 kg)   SpO2 99%   BMI 35.40 kg/m  VS noted, not ill appearing, obese   Constitutional: Pt is oriented to person, place, and time. Appears  well-developed and well-nourished, in no significant distress and comfortable Head: Normocephalic and atraumatic  Eyes: Conjunctivae and EOM are normal. Pupils are equal, round, and reactive to light Right Ear: External ear normal without discharge Left Ear: External ear normal without discharge Nose: Nose without discharge or deformity Mouth/Throat: Oropharynx is without other ulcerations and moist  Neck: Normal range of motion. Neck supple. No JVD present. No tracheal deviation present or significant neck LA or mass Cardiovascular: Normal rate, regular rhythm, normal heart sounds and intact distal pulses.   Pulmonary/Chest: WOB normal and breath sounds without rales or wheezing  Abdominal: Soft. Bowel sounds are normal. NT. No HSM  Musculoskeletal:  Normal range of motion. Exhibits no edema Lymphadenopathy: Has no other cervical adenopathy.  Neurological: Pt is alert and oriented to person, place, and time. Pt has normal reflexes. No cranial nerve deficit. Motor grossly intact, Gait intact, has some decreased sensation to bilat distal LE's to LT Skin: Skin is warm and dry. No rash noted or new ulcerations Psychiatric:  Has normal mood and affect. Behavior is normal without agitation No other exam findings    Assessment & Plan:

## 2017-01-03 NOTE — Patient Instructions (Addendum)
You had the Tdap Tetanus shot today  Please continue all other medications as before, and refills have been done if requested.  Please have the pharmacy call with any other refills you may need.  Please continue your efforts at being more active, low cholesterol diet, and weight control.  You are otherwise up to date with prevention measures today.  Please keep your appointments with your specialists as you may have planned  You will be contacted regarding the referral for: MRI and orthopedic  Please go to the LAB in the Basement (turn left off the elevator) for the tests to be done today  You will be contacted by phone if any changes need to be made immediately.  Otherwise, you will receive a letter about your results with an explanation, but please check with MyChart first.  Please remember to sign up for MyChart if you have not done so, as this will be important to you in the future with finding out test results, communicating by private email, and scheduling acute appointments online when needed.  Please return in 6 months, or sooner if needed

## 2017-01-03 NOTE — Assessment & Plan Note (Signed)
Also for f/u PSA a he is due

## 2017-01-03 NOTE — Telephone Encounter (Signed)
-----   Message from Biagio Borg, MD sent at 01/03/2017  4:16 PM EDT ----- Left message on MyChart, pt to cont same tx except  The test results show that your current treatment is OK, except the PSA is higher than any done recently.  I believe you have seen urology, but not sure, so I will ask the office to call and see if you need a referral to follow up.Alex Kemp to please inform pt, and let me know if pt needs referral to urology

## 2017-01-03 NOTE — Assessment & Plan Note (Addendum)
Recent worsening, suspect spinal stenosis or other lumbar DJD/DDD , declines pain med, for MRI LS Spine and orthopedic referral  Note:  Total time for pt hx, exam, review of record with pt in the room, determination of diagnoses and plan for further eval and tx is > 40 min, with over 50% spent in coordination and counseling of patient, including the differential dx, evaluation needed and further tx and management of lower back pain, elev PSA, HLD

## 2017-01-03 NOTE — Assessment & Plan Note (Signed)
stable overall by history and exam, recent data reviewed with pt, and pt to continue medical treatment as before,  to f/u any worsening symptoms or concerns Lab Results  Component Value Date   LDLCALC 81 01/03/2016   For f/u lab today

## 2017-01-03 NOTE — Telephone Encounter (Signed)
Pt has been informed and expressed understanding.  

## 2017-01-03 NOTE — Assessment & Plan Note (Signed)
stable overall by history and exam, recent data reviewed with pt, and pt to continue medical treatment as before,  to f/u any worsening symptoms or concerns BP Readings from Last 3 Encounters:  01/03/17 130/80  07/15/16 136/76  05/21/16 138/80

## 2017-01-07 ENCOUNTER — Other Ambulatory Visit: Payer: Self-pay

## 2017-01-07 MED ORDER — TEMAZEPAM 30 MG PO CAPS: 30.0000 mg | ORAL_CAPSULE | Freq: Every evening | ORAL | 3 refills | Status: DC | PRN

## 2017-01-20 ENCOUNTER — Telehealth: Payer: Self-pay | Admitting: Internal Medicine

## 2017-01-20 NOTE — Telephone Encounter (Signed)
Forwarding msg to MD assistant for PA

## 2017-01-20 NOTE — Telephone Encounter (Signed)
Patient states pharmacy requested PA two weeks ago for temazepam.  Patient states he only has two pills left.  He is requesting a phone PA to be done if possible.  [place] [phone].  [facility].  Patient is requesting med to be shipped overnight.

## 2017-01-21 NOTE — Telephone Encounter (Signed)
Submitted on 01/21/17 Key: [id number]

## 2017-01-21 NOTE — Telephone Encounter (Signed)
Approved 12/22/16-01/21/18

## 2017-01-23 ENCOUNTER — Ambulatory Visit
Admission: RE | Admit: 2017-01-23 | Discharge: 2017-01-23 | Disposition: A | Discharge status: Home / Self Care | Payer: Medicare Other | Source: Ambulatory Visit | Attending: Internal Medicine | Admitting: Internal Medicine

## 2017-01-23 DIAGNOSIS — M4807 Spinal stenosis, lumbosacral region: Secondary | ICD-10-CM

## 2017-01-23 DIAGNOSIS — M48061 Spinal stenosis, lumbar region without neurogenic claudication: Secondary | ICD-10-CM | POA: Diagnosis not present

## 2017-02-06 ENCOUNTER — Telehealth: Payer: Self-pay | Admitting: Internal Medicine

## 2017-02-06 NOTE — Telephone Encounter (Signed)
Patient requesting MRI to be sent to Dr. Lorin Mercy at [place].  States Dr. Jenny Reichmann had referred him.  States he has appt 10/9 with Dr. Lorin Mercy and would like him to have MRI.

## 2017-02-06 NOTE — Telephone Encounter (Signed)
Done

## 2017-02-12 ENCOUNTER — Other Ambulatory Visit: Payer: Self-pay | Admitting: Internal Medicine

## 2017-02-12 MED ORDER — TESTOSTERONE 50 MG/5GM (1%) TD GEL: 10.0000 g | Freq: Every day | TRANSDERMAL | 1 refills | Status: DC

## 2017-02-12 NOTE — Telephone Encounter (Signed)
Done hardcopy to Shirron  

## 2017-02-12 NOTE — Telephone Encounter (Signed)
Pt called for a refill of his testosterone (VOGELXO) 50 MG/5GM (1%) GEL Please send to [facility]  Please advise

## 2017-02-12 NOTE — Telephone Encounter (Signed)
Faxed

## 2017-02-17 NOTE — Telephone Encounter (Signed)
It was faxed to [facility] originally. I will sent to mail order.

## 2017-02-17 NOTE — Telephone Encounter (Addendum)
Can this be resent? They have not received it. He said that this happens every time and has been working on it for over month. He asked if this could be called in and sent to him as priority mail.  Phone: [phone] Fax: [phone] Pt said that he will call back tomorrow to check on the status of the prescription.

## 2017-02-18 NOTE — Telephone Encounter (Signed)
He said that it was sent over for a quantity for 900... He said that it is 90 day supply twice a day for a quantity for 180. They can be reached at [phone].

## 2017-02-18 NOTE — Telephone Encounter (Signed)
A 90d supply of 180 was what was sent in on 02/12/17 and refaxed on 02/17/17.   Called provided number and they have the correct script of file of 180 for a 90 day supply.

## 2017-02-18 NOTE — Telephone Encounter (Signed)
Spoke with pt to let him know.

## 2017-02-19 DIAGNOSIS — D1801 Hemangioma of skin and subcutaneous tissue: Secondary | ICD-10-CM | POA: Diagnosis not present

## 2017-02-19 DIAGNOSIS — D2261 Melanocytic nevi of right upper limb, including shoulder: Secondary | ICD-10-CM | POA: Diagnosis not present

## 2017-02-19 DIAGNOSIS — L814 Other melanin hyperpigmentation: Secondary | ICD-10-CM | POA: Diagnosis not present

## 2017-02-19 DIAGNOSIS — D225 Melanocytic nevi of trunk: Secondary | ICD-10-CM | POA: Diagnosis not present

## 2017-02-19 DIAGNOSIS — L570 Actinic keratosis: Secondary | ICD-10-CM | POA: Diagnosis not present

## 2017-02-19 DIAGNOSIS — Z85828 Personal history of other malignant neoplasm of skin: Secondary | ICD-10-CM | POA: Diagnosis not present

## 2017-02-19 DIAGNOSIS — L821 Other seborrheic keratosis: Secondary | ICD-10-CM | POA: Diagnosis not present

## 2017-02-19 DIAGNOSIS — D2272 Melanocytic nevi of left lower limb, including hip: Secondary | ICD-10-CM | POA: Diagnosis not present

## 2017-02-19 DIAGNOSIS — C44529 Squamous cell carcinoma of skin of other part of trunk: Secondary | ICD-10-CM | POA: Diagnosis not present

## 2017-02-19 DIAGNOSIS — C44519 Basal cell carcinoma of skin of other part of trunk: Secondary | ICD-10-CM | POA: Diagnosis not present

## 2017-02-19 NOTE — Telephone Encounter (Signed)
Pharmacy called stating the pt would rather be on the testosterone (ANDROGEL) 50 MG/5GM GEL Please advise and call pharmacy back   [place]  Call back [phone] Reference Number [id number]

## 2017-02-20 MED ORDER — TESTOSTERONE 50 MG/5GM (1%) TD GEL: 10.0000 g | Freq: Every day | TRANSDERMAL | 1 refills | Status: DC

## 2017-02-20 NOTE — Addendum Note (Signed)
Addended by: Biagio Borg on: 02/20/2017 08:16 AM   Modules accepted: Orders

## 2017-02-20 NOTE — Telephone Encounter (Signed)
Faxed

## 2017-02-20 NOTE — Telephone Encounter (Signed)
Done hardcopy to Shirron  

## 2017-02-25 ENCOUNTER — Ambulatory Visit (INDEPENDENT_AMBULATORY_CARE_PROVIDER_SITE_OTHER): Payer: Self-pay | Admitting: Orthopaedic Surgery

## 2017-03-04 ENCOUNTER — Ambulatory Visit (INDEPENDENT_AMBULATORY_CARE_PROVIDER_SITE_OTHER): Payer: Medicare Other | Admitting: Orthopaedic Surgery

## 2017-03-04 ENCOUNTER — Encounter (INDEPENDENT_AMBULATORY_CARE_PROVIDER_SITE_OTHER): Payer: Self-pay | Admitting: Orthopaedic Surgery

## 2017-03-04 ENCOUNTER — Telehealth: Payer: Self-pay | Admitting: Internal Medicine

## 2017-03-04 ENCOUNTER — Ambulatory Visit (INDEPENDENT_AMBULATORY_CARE_PROVIDER_SITE_OTHER): Payer: Medicare Other

## 2017-03-04 VITALS — BP 179/82 | HR 81 | Ht 72.0 in | Wt 255.0 lb

## 2017-03-04 DIAGNOSIS — M545 Low back pain: Secondary | ICD-10-CM

## 2017-03-04 DIAGNOSIS — M48062 Spinal stenosis, lumbar region with neurogenic claudication: Secondary | ICD-10-CM

## 2017-03-04 DIAGNOSIS — G8929 Other chronic pain: Secondary | ICD-10-CM

## 2017-03-04 DIAGNOSIS — G9519 Other vascular myelopathies: Secondary | ICD-10-CM

## 2017-03-04 NOTE — Progress Notes (Signed)
Office Visit Note   Patient: Alex Kemp           Date of Birth: 1940/08/30           MRN: [id number] Visit Date: 03/04/2017              Requested by: Biagio Borg, MD [address] [place], [place] [address] PCP: Biagio Borg, MD   Assessment & Plan: Visit Diagnoses:  1. Chronic bilateral low back pain, with sciatica presence unspecified   2. 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  Plan: Patient has progressive claudication neurogenic symptoms with lumbar stenosis which is moderately severe at L3-4 and L4-5. He is having difficulty with community ambulation problems of the legs getting weak numbness to stop and sit. He can walk a half block and then he has to stop and sit he does better feelings over grocery cart. Bother him when he travels. He got stuck in [place] and could make it to the next half block to get a cab. We discussed the minimal improvement epidural with neurogenic claudication. Risk for development of clot on quinine acutely when he has severe stenosis if an injection is performed. Plan will be 2 level lumbar decompression once against medical clearance L3-4, L4-5 with overnight stay in the hospital. He does not have any instability. He has mild changes at L2-3 and L5-S1 and we discussed that he should be a low ambulate further and stand longer after decompression surgery. I gave him a copy of the report we reviewed the MRI images. We discussed surgical indications surgical technique using operative microscope. Risk of dural tear potential for repair if it did occur with the rate of less than 4%. Overnight stay in the hospital. He may need use a walker or cane for a short period of time after the surgery. Questions were elicited and answered he understands request we proceed.  Follow-Up Instructions: No Follow-up on file.   Orders:  Orders Placed This Encounter  Procedures  . XR Lumbar Spine 2-3 Views   No orders of the defined types were  placed in this encounter.     Procedures: No procedures performed   Clinical Data: No additional findings.   Subjective: Chief Complaint  Patient presents with  . 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 He can only stand 5 minutes he has to lean forward on object to get relief or sit down. He does better with a grocery cart. He's taken anti-inflammatories without relief. He's had previous MRI scan ordered by Dr. Cathlean Cower a/30/2018 that showed moderate to severe multifactorial stenosis at L 3-4 and at L4-5. Mild narrowing at other levels. No associated bowel or bladder symptoms. He has problems with community ambulation due to his claudication he gets relief after sitting 5 minutes. He's been on both Modic as well as will tear and without relief.  Review of Systems is systems positive for left arm fracture 47 tonsillectomy 48 was 372. Right ORIF proximal humerus 1990. Cornea injury 95. Nasal sinus surgery 73. Benign 0.2 thousand 9. Lasix eye surgery 2001 right and left. Positive for acid reflux. Positive for hypertension history of GERD, insomnia, prostatitis as well as neurogenic claudication.   Objective: Vital Signs: BP (!) 179/82   Pulse 81   Ht  6' (1.829 m)   Wt 255 lb (115.7 kg)   BMI 34.58 kg/m   Physical Exam  Constitutional: He is oriented to person, place, and time. He appears well-developed and well-nourished.  HENT:  Head: Normocephalic and atraumatic.  Eyes: Pupils are equal, round, and reactive to light. EOM are normal.  Neck: No tracheal deviation present. No thyromegaly present.  Cardiovascular: Normal rate.   Pulmonary/Chest: Effort normal. He has no wheezes.  Abdominal: Soft. Bowel sounds are normal.  Neurological: He is alert and oriented to person, place, and time.  Skin: Skin is warm and dry.  Capillary refill takes less than 2 seconds.  Psychiatric: He has a normal mood and affect. His behavior is normal. Judgment and thought content normal.    Ortho Exam patient has good pedal pulses good range of motion of his hips. Knee and ankle jerk are 2+ and symmetrical lumbar skin is well-healed. Normal lumbar excursion. Mild sciatic notch tenderness on the right than left. Quads hip abductors gastrocsoleus ankle dorsiflexion plantar flexion great toe extension is normal to manual testing and resisted testing. Pedal pulses are 2+ dorsalis pedis and posterior tibial.  Specialty Comments:  No specialty comments available.  Imaging: No results found. Contrast  Study Result   CLINICAL DATA:  76 year old male with progressed chronic lumbar back pain, now accompanied by numbness. Pain radiating to both buttocks and legs with numbness in both feet. No specific injury.  EXAM: MRI LUMBAR SPINE WITHOUT CONTRAST  TECHNIQUE: Multiplanar, multisequence MR imaging of the lumbar spine was performed. No intravenous contrast was administered.  COMPARISON:  Lumbar radiographs 12/24/2011.  FINDINGS: Segmentation: Transitional lumbosacral anatomy is demonstrated on the recent radiographs. For the purposes of this report absent ribs are designated at T12, with full size ribs at T11. By this numbering system the lowest full size disc space is L5-S1. Correlation with radiographs is recommended prior to any operative intervention.  Alignment: Stable. 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 Relatively preserved lumbar lordosis. Subtle retrolisthesis of L3 on L4, and anterolisthesis of L5 on S1.  Vertebrae: Mostly chronic appearing L4 superior endplate Schmorl node located to the right of midline does show mild associated marrow edema (series 8, image 8). Underlying bone marrow signal is within normal limits. No other acute osseous abnormality identified. Intact visible  sacrum and SI joints.  Conus medullaris: Extends to the L1 level and appears normal.  Paraspinal and other soft tissues: Partially visible simple appearing exophytic right renal cysts. Otherwise negative.  Disc levels:  T11-T12: Small central disc extrusion with caudal migration of disc material best seen on series 6, image 8. Borderline to mild spinal stenosis. Subtle right paracentral disc protrusion.  T12-L1:  No significant stenosis.  L1-L2: Mild circumferential disc bulge. Mild facet and ligament flavum hypertrophy. Borderline to mild bilateral L1 foraminal stenosis.  L2-L3: Mild but mostly far lateral circumferential disc bulge. Mild to moderate facet and mild ligament flavum hypertrophy. Borderline to mild spinal and left L2 neural foraminal stenosis.  L3-L4: Left eccentric circumferential disc bulge with broad-based left subarticular component. Moderate facet and ligament flavum hypertrophy. Endplate spurring. Severe spinal and left lateral recess stenosis (series 11, image 25, descending left L4 nerve root level). Moderate to severe bilateral L3 foraminal stenosis.  L4-L5: Left eccentric circumferential disc bulge with broad-based left subarticular component. Endplate spurring. Moderate facet and mild to moderate ligament flavum hypertrophy. Moderate to severe spinal and left greater than right lateral recess stenosis (descending L5 nerve root levels). Moderate  to severe left and mild right L4 foraminal stenosis.  L5-S1: Broad-based posterior disc bulge/ pseudo disc. Moderate facet hypertrophy greater on the left. No significant spinal stenosis. Mild to moderate bilateral lateral recess stenosis (descending S1 nerve root levels). No convincing foraminal stenosis.  IMPRESSION: 1. 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 Associated moderate to severe bilateral L3 and left L4 neural foraminal  stenosis. 2. Borderline to mild spinal stenosis at L2-L3, and mild to moderate lateral recess stenosis at L5-S1. 3. Trace marrow edema associated with a chronic L4 superior endplate Schmorl node.   Electronically Signed   By: Genevie Ann M.D.   On: 01/23/2017 13:17      PMFS History: Patient Active Problem List   Diagnosis Date Noted  . 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 Cough 05/21/2016  . OAB (overactive bladder) 05/21/2016  . Elevated PSA 06/23/2014  . Foot pain, left 11/23/2013  . Preventative health care 06/18/2013  . Lumbar disc disease 06/18/2013  . Possible exposure to STD 12/17/2010  . Diarrhea 08/21/2010  . 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 Insomnia 10/09/2009  . ELEVATED PROSTATE SPECIFIC ANTIGEN 10/02/2009  . HERPES LABIALIS 04/04/2009  . INSECT BITE 09/28/2007  . ERECTILE DYSFUNCTION 09/23/2007  . Essential hypertension 12/17/2006  . COLONIC POLYPS, ADENOMATOUS, BENIGN 12/17/2006  . Hyperlipidemia 11/20/2006  . GERD 11/20/2006  . Headache(784.0) 11/20/2006   Past Medical History:  Diagnosis Date  . Corneal injury   . GERD (gastroesophageal reflux disease)   . Headache(784.0)   . Hx of colonic polyps   . Hyperlipidemia   . Hypertension   . Insomnia     Family History  Problem Relation Age of Onset  . Arthritis Other     Past Surgical History:  Procedure Laterality Date  . BACK SURGERY     lumbar region  . corneal injury    . dental plate    . lt are fracture    . NASAL SINUS SURGERY    . panedoscopy  04/23/1999  . PENILE PROSTHESIS IMPLANT    . refreactory surgery to eyes    . rt shoulder surgery     Social History   Occupational History  . Not on file.   Social History Main Topics  . Smoking status: Former Smoker    Quit date: 12/26/1978  . Smokeless tobacco: Never Used  . Alcohol use Yes  . Drug use: No  . Sexual activity: Yes

## 2017-03-04 NOTE — Telephone Encounter (Signed)
Pt called and was seen by Dr. Lorin Mercy today and he is recommending lumbar/spince surgery for a bone spur and space narrowing . The patients needs a letter faxed to Dr. Lorin Mercy office for surgical clearance  Please advise  Fax Number  [phone] Dr. Lorin Mercy assistant os Baird Lyons  Pt has an appointment tomorrow for AWV  Please advise

## 2017-03-05 ENCOUNTER — Ambulatory Visit (INDEPENDENT_AMBULATORY_CARE_PROVIDER_SITE_OTHER): Payer: Medicare Other | Admitting: *Deleted

## 2017-03-05 ENCOUNTER — Encounter: Payer: Self-pay | Admitting: Internal Medicine

## 2017-03-05 VITALS — BP 144/82 | HR 74 | Resp 20 | Ht 72.0 in | Wt 256.0 lb

## 2017-03-05 DIAGNOSIS — Z23 Encounter for immunization: Secondary | ICD-10-CM

## 2017-03-05 DIAGNOSIS — Z0000 Encounter for general adult medical examination without abnormal findings: Secondary | ICD-10-CM

## 2017-03-05 NOTE — Progress Notes (Signed)
Pre visit review using our clinic review tool, if applicable. No additional management support is needed unless otherwise documented below in the visit note. 

## 2017-03-05 NOTE — Progress Notes (Addendum)
Subjective:   Alex Kemp is a 76 y.o. male who presents for Medicare Annual/Subsequent preventive examination.  Review of Systems:  No ROS.  Medicare Wellness Visit. Additional risk factors are reflected in the social history.  Cardiac Risk Factors include: dyslipidemia;hypertension;male gender;obesity (BMI >30kg/m2);sedentary lifestyle Sleep patterns: feels rested on waking, gets up 1-2 times nightly to void and sleeps 6-7 hours nightly.    Home Safety/Smoke Alarms: Feels safe in home. Smoke alarms in place.  Living environment; residence and Firearm Safety: 2-story house, no firearms., Lives with significant other, no needs for DME, good support system Seat Belt Safety/Bike Helmet: Wears seat belt.     Objective:    Vitals: BP (!) 144/82   Pulse 74   Resp 20   Ht 6' (1.829 m)   Wt 256 lb (116.1 kg)   SpO2 98%   BMI 34.72 kg/m   Body mass index is 34.72 kg/m.  Tobacco History  Smoking Status  . Former Smoker  . Quit date: 12/26/1978  Smokeless Tobacco  . Never Used     Counseling given: Not Answered   Past Medical History:  Diagnosis Date  . Corneal injury   . GERD (gastroesophageal reflux disease)   . Headache(784.0)   . Hx of colonic polyps   . Hyperlipidemia   . Hypertension   . Insomnia    Past Surgical History:  Procedure Laterality Date  . BACK SURGERY     lumbar region  . corneal injury    . dental plate    . lt are fracture    . NASAL SINUS SURGERY    . panedoscopy  04/23/1999  . PENILE PROSTHESIS IMPLANT    . refreactory surgery to eyes    . rt shoulder surgery     Family History  Problem Relation Age of Onset  . Arthritis Other    History  Sexual Activity  . Sexual activity: Yes    Outpatient Encounter Prescriptions as of 03/05/2017  Medication Sig  . aspirin EC 81 MG tablet Take 81 mg by mouth daily with lunch.  . b complex vitamins tablet Take 1 tablet by mouth daily with lunch.  . Coenzyme Q10 (COQ10 PO) Take 1 tablet by mouth  daily with lunch.  . diclofenac (VOLTAREN) 75 MG EC tablet Take 75 mg by mouth as needed.   . Glucosamine HCl (GLUCOSAMINE PO) Take 1 tablet by mouth daily with lunch.  . loperamide (IMODIUM) 2 MG capsule Take 2 mg by mouth 2 (two) times daily as needed for diarrhea or loose stools.   [name] losartan (COZAAR) 50 MG tablet Take 1 tablet (50 mg total) by mouth daily.  . meloxicam (MOBIC) 7.5 MG tablet Take 1 tablet (7.5 mg total) by mouth daily. Annual appt due in august must see MD for refills  . Multiple Vitamin (MULTIVITAMIN WITH MINERALS) TABS tablet Take 1 tablet by mouth daily with lunch.  . Omega-3 Fatty Acids (FISH OIL PO) Take 1 capsule by mouth daily with lunch.  . pantoprazole (PROTONIX) 40 MG tablet Take 1 tablet (40 mg total) by mouth 2 (two) times daily.  . Polyvinyl Alcohol-Povidone (REFRESH OP) Place 1 drop into both eyes 2 (two) times daily.  . Probiotic Product (PROBIOTIC FORMULA PO) Take 1 capsule by mouth daily with lunch.   [name] rOPINIRole (REQUIP) 1 MG tablet Take 1 tablet (1 mg total) by mouth at bedtime.  . simvastatin (ZOCOR) 40 MG tablet Take 1 tablet (40 mg total) by mouth every evening.  [name]  tamsulosin (FLOMAX) 0.4 MG CAPS capsule Take 1 capsule (0.4 mg total) by mouth daily.  . temazepam (RESTORIL) 30 MG capsule Take 1 capsule (30 mg total) by mouth at bedtime as needed for sleep.  [name] testosterone (ANDROGEL) 50 MG/5GM (1%) GEL Place 10 g onto the skin daily.   No facility-administered encounter medications on file as of 03/05/2017.     Activities of Daily Living In your present state of health, do you have any difficulty performing the following activities: 03/05/2017  Hearing? N  Vision? N  Difficulty concentrating or making decisions? N  Walking or climbing stairs? N  Dressing or bathing? N  Doing errands, shopping? N  Preparing Food and eating ? N  Using the Toilet? N  In the past six months, have you accidently leaked urine? N  Do you have problems with loss of  bowel control? N  Managing your Medications? N  Managing your Finances? N  Housekeeping or managing your Housekeeping? N  Some recent data might be hidden    Patient Care Team: Biagio Borg, MD as PCP - General (Internal Medicine) Marybelle Killings, MD as Consulting Physician (Orthopedic Surgery)   Assessment:    Physical assessment deferred to PCP.  Exercise Activities and Dietary recommendations Current Exercise Habits: The patient does not participate in regular exercise at present, Exercise limited by: None identified  Diet (meal preparation, eat out, water intake, caffeinated beverages, dairy products, fruits and vegetables): in general, an "unhealthy" diet  Reviewed heart healthy diet, encouraged patient to increase daily water intake. Discussed portion control.   Goals    . Increase my physical activity by walking, increase water intake, eat more fish and vegetables      Fall Risk Fall Risk  03/05/2017 01/03/2017 01/03/2017 01/03/2016 12/22/2014  Falls in the past year? No No No No Yes  Number falls in past yr: - - - - 1  Injury with Fall? - - - - No  Risk for fall due to : Impaired balance/gait;Impaired mobility - - - -   Depression Screen PHQ 2/9 Scores 03/05/2017 01/03/2017 01/03/2016 12/22/2014  PHQ - 2 Score 0 0 0 0  PHQ- 9 Score 0 - - -    Cognitive Function       Ad8 score reviewed for issues:  Issues making decisions: no  Less interest in hobbies / activities: no  Repeats questions, stories (family complaining): no  Trouble using ordinary gadgets (microwave, computer, phone):no  Forgets the month or year: no  Mismanaging finances: no  Remembering appts: no  Daily problems with thinking and/or memory: no Ad8 score is= 0  Immunization History  Administered Date(s) Administered  . DTaP 01/03/2017  . Influenza Split 02/17/2012, 01/09/2016  . Influenza Whole 03/17/2007, 05/31/2008, 03/30/2009, 01/26/2010  . Influenza, High Dose Seasonal PF 02/25/2013,  03/14/2014  . Influenza-Unspecified 02/14/2015  . Pneumococcal Conjugate-13 06/18/2013  . Pneumococcal Polysaccharide-23 05/27/2006, 08/24/2012  . Td 05/27/2006   Screening Tests Health Maintenance  Topic Date Due  . TETANUS/TDAP  05/27/2016  . INFLUENZA VACCINE  12/25/2016  . COLONOSCOPY  09/29/2019  . PNA vac Low Risk Adult  Completed      Plan:    Continue doing brain stimulating activities (puzzles, reading, adult coloring books, staying active) to keep memory sharp.   Start to eat heart healthy diet (full of fruits, vegetables, whole grains, lean protein, water--limit salt, fat, and sugar intake) and increase physical activity as tolerated.  I have personally reviewed and noted the  following in the patient's chart:   . Medical and social history . Use of alcohol, tobacco or illicit drugs  . Current medications and supplements . Functional ability and status . Nutritional status . Physical activity . Advanced directives . List of other physicians . Vitals . Screenings to include cognitive, depression, and falls . Referrals and appointments  In addition, I have reviewed and discussed with patient certain preventive protocols, quality metrics, and best practice recommendations. A written personalized care plan for preventive services as well as general preventive health recommendations were provided to patient.     Michiel Cowboy, RN  03/05/2017  Medical screening examination/treatment/procedure(s) were performed by non-physician practitioner and as supervising physician I was immediately available for consultation/collaboration. I agree with above. Cathlean Cower, MD

## 2017-03-05 NOTE — Addendum Note (Signed)
Addended by: Emelia Loron A on: 03/05/2017 05:51 PM   Modules accepted: SmartSet

## 2017-03-05 NOTE — Patient Instructions (Addendum)
Continue doing brain stimulating activities (puzzles, reading, adult coloring books, staying active) to keep memory sharp.   Start to eat heart healthy diet (full of fruits, vegetables, whole grains, lean protein, water--limit salt, fat, and sugar intake) and increase physical activity as tolerated.   Alex Kemp , Thank you for taking time to come for your Medicare Wellness Visit. I appreciate your ongoing commitment to your health goals. Please review the following plan we discussed and let me know if I can assist you in the future.   These are the goals we discussed: Goals    . Increase my physical activity by walking, increase water intake, eat more fish and vegetables       This is a list of the screening recommended for you and due dates:  Health Maintenance  Topic Date Due  . Tetanus Vaccine  05/27/2016  . Flu Shot  12/25/2016  . Colon Cancer Screening  09/29/2019  . Pneumonia vaccines  Completed   Influenza Virus Vaccine injection What is this medicine? INFLUENZA VIRUS VACCINE (in floo EN zuh VAHY ruhs vak SEEN) helps to reduce the risk of getting influenza also known as the flu. The vaccine only helps protect you against some strains of the flu. This medicine may be used for other purposes; ask your health care provider or pharmacist if you have questions. COMMON BRAND NAME(S): Afluria, Agriflu, Alfuria, FLUAD, Fluarix, Fluarix Quadrivalent, Flublok, Flublok Quadrivalent, FLUCELVAX, Flulaval, Fluvirin, Fluzone, Fluzone High-Dose, Fluzone Intradermal What should I tell my health care provider before I take this medicine? They need to know if you have any of these conditions: -bleeding disorder like hemophilia -fever or infection -Guillain-Barre syndrome or other neurological problems -immune system problems -infection with the human immunodeficiency virus (HIV) or AIDS -low blood platelet counts -multiple sclerosis -an unusual or allergic reaction to influenza virus vaccine,  latex, other medicines, foods, dyes, or preservatives. Different brands of vaccines contain different allergens. Some may contain latex or eggs. Talk to your doctor about your allergies to make sure that you get the right vaccine. -pregnant or trying to get pregnant -breast-feeding How should I use this medicine? This vaccine is for injection into a muscle or under the skin. It is given by a health care professional. A copy of Vaccine Information Statements will be given before each vaccination. Read this sheet carefully each time. The sheet may change frequently. Talk to your healthcare provider to see which vaccines are right for you. Some vaccines should not be used in all age groups. Overdosage: If you think you have taken too much of this medicine contact a poison control center or emergency room at once. NOTE: This medicine is only for you. Do not share this medicine with others. What if I miss a dose? This does not apply. What may interact with this medicine? -chemotherapy or radiation therapy -medicines that lower your immune system like etanercept, anakinra, infliximab, and adalimumab -medicines that treat or prevent blood clots like warfarin -phenytoin -steroid medicines like prednisone or cortisone -theophylline -vaccines This list may not describe all possible interactions. Give your health care provider a list of all the medicines, herbs, non-prescription drugs, or dietary supplements you use. Also tell them if you smoke, drink alcohol, or use illegal drugs. Some items may interact with your medicine. What should I watch for while using this medicine? Report any side effects that do not go away within 3 days to your doctor or health care professional. Call your health care provider if any  unusual symptoms occur within 6 weeks of receiving this vaccine. You may still catch the flu, but the illness is not usually as bad. You cannot get the flu from the vaccine. The vaccine will not  protect against colds or other illnesses that may cause fever. The vaccine is needed every year. What side effects may I notice from receiving this medicine? Side effects that you should report to your doctor or health care professional as soon as possible: -allergic reactions like skin rash, itching or hives, swelling of the face, lips, or tongue Side effects that usually do not require medical attention (report to your doctor or health care professional if they continue or are bothersome): -fever -headache -muscle aches and pains -pain, tenderness, redness, or swelling at the injection site -tiredness This list may not describe all possible side effects. Call your doctor for medical advice about side effects. You may report side effects to FDA at 1-800-FDA-1088. Where should I keep my medicine? The vaccine will be given by a health care professional in a clinic, pharmacy, doctor's office, or other health care setting. You will not be given vaccine doses to store at home. NOTE: This sheet is a summary. It may not cover all possible information. If you have questions about this medicine, talk to your doctor, pharmacist, or health care provider.  2018 Elsevier/Gold Standard (2014-12-02 10:07:28)

## 2017-03-05 NOTE — Telephone Encounter (Signed)
Letter sent, cont same tx

## 2017-03-05 NOTE — Telephone Encounter (Signed)
Faxed

## 2017-03-13 ENCOUNTER — Other Ambulatory Visit: Payer: Self-pay | Admitting: Internal Medicine

## 2017-03-25 DIAGNOSIS — Z87438 Personal history of other diseases of male genital organs: Secondary | ICD-10-CM | POA: Diagnosis not present

## 2017-03-25 DIAGNOSIS — R972 Elevated prostate specific antigen [PSA]: Secondary | ICD-10-CM | POA: Diagnosis not present

## 2017-03-25 DIAGNOSIS — E291 Testicular hypofunction: Secondary | ICD-10-CM | POA: Diagnosis not present

## 2017-03-25 DIAGNOSIS — R350 Frequency of micturition: Secondary | ICD-10-CM | POA: Diagnosis not present

## 2017-04-18 NOTE — Pre-Procedure Instructions (Signed)
Hesston Hitchens  04/18/2017      [id number] - [place], [place] - [address] AT [address] [place] [address] Phone: [phone] Fax: [phone]    Your procedure is scheduled on 04/23/2017.  Report to [facility] Admitting at 1030 A.M.  Call this number if you have problems the morning of surgery:  [phone]   Remember:  Do not eat food or drink liquids after midnight.  Take these medicines the morning of surgery with A SIP OF WATER: Pantoprazole (Protonix) Polyvinyl Alcohol-Povidone (Refresh OP) tamsulosin (Flomax)  7 days prior to surgery STOP taking any Aspirin(unless otherwise instructed by your surgeon), Aleve, Naproxen, Ibuprofen, Motrin, Advil, Goody's, BC's, all herbal medications, fish oil, and all vitamins - this includes Diclofenac (Voltaren) and Meloxicam (Mobic)     Do not wear jewelry.  Do not wear lotions, powders, or colognes, or deodorant.  Men may shave face and neck.  Do not bring valuables to the hospital.  [facility] is not responsible for any belongings or valuables.  Contacts, eyeglasses, dentures or bridgework may not be worn into surgery.  Leave your suitcase in the car.  After surgery it may be brought to your room.  For patients admitted to the hospital, discharge time will be determined by your treatment team.  Patients discharged the day of surgery will not be allowed to drive home.   Name and phone number of your driver:    Special instructions:   [place]- Preparing For Surgery  Before surgery, you can play an important role. Because skin is not sterile, your skin needs to be as free of germs as possible. You can reduce the number of germs on your skin by washing with CHG (chlorahexidine gluconate) Soap before surgery.  CHG is an antiseptic cleaner which kills germs and bonds with the skin to continue killing germs even after washing.  Please do not use if you  have an allergy to CHG or antibacterial soaps. If your skin becomes reddened/irritated stop using the CHG.  Do not shave (including legs and underarms) for at least 48 hours prior to first CHG shower. It is OK to shave your face.  Please follow these instructions carefully.   1. Shower the NIGHT BEFORE SURGERY and the MORNING OF SURGERY with CHG.   2. If you chose to wash your hair, wash your hair first as usual with your normal shampoo.  3. After you shampoo, rinse your hair and body thoroughly to remove the shampoo.  4. Use CHG as you would any other liquid soap. You can apply CHG directly to the skin and wash gently with a scrungie or a clean washcloth.   5. Apply the CHG Soap to your body ONLY FROM THE NECK DOWN.  Do not use on open wounds or open sores. Avoid contact with your eyes, ears, mouth and genitals (private parts). Wash Face and genitals (private parts)  with your normal soap.  6. Wash thoroughly, paying special attention to the area where your surgery will be performed.  7. Thoroughly rinse your body with warm water from the neck down.  8. DO NOT shower/wash with your normal soap after using and rinsing off the CHG Soap.  9. Pat yourself dry with a CLEAN TOWEL.  10. Wear CLEAN PAJAMAS to bed the night before surgery, wear comfortable clothes the morning of surgery  11. Place CLEAN SHEETS on your bed the night of your first shower  and DO NOT SLEEP WITH PETS.    Day of Surgery: Shower as stated above. Do not apply any deodorants/lotions. Please wear clean clothes to the hospital/surgery center.      Please read over the following fact sheets that you were given.

## 2017-04-21 ENCOUNTER — Encounter (HOSPITAL_COMMUNITY): Payer: Self-pay

## 2017-04-21 ENCOUNTER — Other Ambulatory Visit: Payer: Self-pay

## 2017-04-21 ENCOUNTER — Encounter (HOSPITAL_COMMUNITY)
Admission: RE | Admit: 2017-04-21 | Discharge: 2017-04-21 | Disposition: A | Discharge status: Home / Self Care | Payer: Medicare Other | Source: Ambulatory Visit | Attending: Orthopaedic Surgery | Admitting: Orthopaedic Surgery

## 2017-04-21 DIAGNOSIS — E785 Hyperlipidemia, unspecified: Secondary | ICD-10-CM

## 2017-04-21 DIAGNOSIS — Z6833 Body mass index (BMI) 33.0-33.9, adult: Secondary | ICD-10-CM | POA: Diagnosis not present

## 2017-04-21 DIAGNOSIS — K219 Gastro-esophageal reflux disease without esophagitis: Secondary | ICD-10-CM

## 2017-04-21 DIAGNOSIS — E669 Obesity, unspecified: Secondary | ICD-10-CM | POA: Insufficient documentation

## 2017-04-21 DIAGNOSIS — Z6834 Body mass index (BMI) 34.0-34.9, adult: Secondary | ICD-10-CM | POA: Insufficient documentation

## 2017-04-21 DIAGNOSIS — G2581 Restless legs syndrome: Secondary | ICD-10-CM | POA: Diagnosis not present

## 2017-04-21 DIAGNOSIS — H04123 Dry eye syndrome of bilateral lacrimal glands: Secondary | ICD-10-CM | POA: Diagnosis not present

## 2017-04-21 DIAGNOSIS — H43813 Vitreous degeneration, bilateral: Secondary | ICD-10-CM | POA: Diagnosis not present

## 2017-04-21 DIAGNOSIS — Z791 Long term (current) use of non-steroidal anti-inflammatories (NSAID): Secondary | ICD-10-CM | POA: Diagnosis not present

## 2017-04-21 DIAGNOSIS — Z01812 Encounter for preprocedural laboratory examination: Secondary | ICD-10-CM | POA: Insufficient documentation

## 2017-04-21 DIAGNOSIS — Z7982 Long term (current) use of aspirin: Secondary | ICD-10-CM | POA: Diagnosis not present

## 2017-04-21 DIAGNOSIS — M48062 Spinal stenosis, lumbar region with neurogenic claudication: Secondary | ICD-10-CM | POA: Diagnosis not present

## 2017-04-21 DIAGNOSIS — Z79899 Other long term (current) drug therapy: Secondary | ICD-10-CM | POA: Diagnosis not present

## 2017-04-21 DIAGNOSIS — G4700 Insomnia, unspecified: Secondary | ICD-10-CM | POA: Diagnosis not present

## 2017-04-21 DIAGNOSIS — Z85828 Personal history of other malignant neoplasm of skin: Secondary | ICD-10-CM | POA: Diagnosis not present

## 2017-04-21 DIAGNOSIS — Z01810 Encounter for preprocedural cardiovascular examination: Secondary | ICD-10-CM

## 2017-04-21 DIAGNOSIS — I10 Essential (primary) hypertension: Secondary | ICD-10-CM

## 2017-04-21 DIAGNOSIS — H52203 Unspecified astigmatism, bilateral: Secondary | ICD-10-CM | POA: Diagnosis not present

## 2017-04-21 DIAGNOSIS — H25813 Combined forms of age-related cataract, bilateral: Secondary | ICD-10-CM | POA: Diagnosis not present

## 2017-04-21 DIAGNOSIS — Z87891 Personal history of nicotine dependence: Secondary | ICD-10-CM | POA: Diagnosis not present

## 2017-04-21 LAB — CBC
HEMATOCRIT: 44.9 % (ref 39.0–52.0)
Hemoglobin: 14.5 g/dL (ref 13.0–17.0)
MCH: 27.4 pg (ref 26.0–34.0)
MCHC: 32.3 g/dL (ref 30.0–36.0)
MCV: 84.7 fL (ref 78.0–100.0)
Platelets: 181 10*3/uL (ref 150–400)
RBC: 5.3 MIL/uL (ref 4.22–5.81)
RDW: 16.1 % — AB (ref 11.5–15.5)
WBC: 6.7 10*3/uL (ref 4.0–10.5)

## 2017-04-21 LAB — COMPREHENSIVE METABOLIC PANEL
ALT: 25 U/L (ref 17–63)
AST: 27 U/L (ref 15–41)
Albumin: 3.7 g/dL (ref 3.5–5.0)
Alkaline Phosphatase: 96 U/L (ref 38–126)
Anion gap: 5 (ref 5–15)
BILIRUBIN TOTAL: 1.1 mg/dL (ref 0.3–1.2)
BUN: 20 mg/dL (ref 6–20)
CALCIUM: 8.8 mg/dL — AB (ref 8.9–10.3)
CO2: 27 mmol/L (ref 22–32)
CREATININE: 1.97 mg/dL — AB (ref 0.61–1.24)
Chloride: 109 mmol/L (ref 101–111)
GFR calc Af Amer: 37 mL/min — ABNORMAL LOW (ref >60)
GFR, EST NON AFRICAN AMERICAN: 31 mL/min — AB (ref >60)
Glucose, Bld: 160 mg/dL — ABNORMAL HIGH (ref 65–99)
POTASSIUM: 4.1 mmol/L (ref 3.5–5.1)
Sodium: 141 mmol/L (ref 135–145)
TOTAL PROTEIN: 6.4 g/dL — AB (ref 6.5–8.1)

## 2017-04-21 LAB — PROTIME-INR
INR: 0.95
PROTHROMBIN TIME: 12.6 s (ref 11.4–15.2)

## 2017-04-21 LAB — APTT: aPTT: 31 seconds (ref 24–36)

## 2017-04-21 LAB — SURGICAL PCR SCREEN
MRSA, PCR: NEGATIVE
Staphylococcus aureus: POSITIVE — AB

## 2017-04-21 NOTE — Progress Notes (Signed)
PCP - Dr. Cathlean Cower Cardiologist - patient denies  Chest x-ray - 05/22/2016 EKG - 04/21/2017 Stress Test - 2003 ECHO - patient denies Cardiac Cath - patient denies  Sleep Study - patient denies CPAP - n/a  Patient's last dose of fish oil and ASA was on 04/16/2017  Anesthesia review: n/a  Patient denies shortness of breath, fever, cough and chest pain at PAT appointment   Patient verbalized understanding of instructions that were given to them at the PAT appointment. Patient was also instructed that they will need to review over the PAT instructions again at home before surgery.

## 2017-04-21 NOTE — Progress Notes (Signed)
   04/21/17 1450  OBSTRUCTIVE SLEEP APNEA  Have you ever been diagnosed with sleep apnea through a sleep study? No  Do you snore loudly (loud enough to be heard through closed doors)?  1  Do you often feel tired, fatigued, or sleepy during the daytime (such as falling asleep during driving or talking to someone)? 0  Has anyone observed you stop breathing during your sleep? 0  Do you have, or are you being treated for high blood pressure? 1  BMI more than 35 kg/m2? 0  Age > 50 (1-yes) 1  Neck circumference greater than:Male 16 inches or larger, Male 17inches or larger? 1  Male Gender (Yes=1) 1  Obstructive Sleep Apnea Score 5  Score 5 or greater  Results sent to PCP

## 2017-04-22 NOTE — Progress Notes (Signed)
Anesthesia Chart Review: Patient is a 76 year old male scheduled for L3-4, L4-5 decompression on 04/23/17 by Dr. Rodell Perna.  History includes former smoker (quit '80), GERD, HLD, HTN, insomnia, nasal sinus surgery, penile prothesis implant, dental plate, corneal injury, BPH. Labs trends in Epic indicate renal insufficiency. BMI is consistent with obesity. OSA screening score was 5.  - PCP is Dr. Cathlean Cower. He cleared patient for surgery from an internal medicine standpoint (see Letters tab). Last physician visit 01/03/17. BUN Cr 16/1.58 at that time. Patient was referred to urology due to elevated PSA.  - [facility]rolog[facility] It looks like he may be scheduled for a prostate biopsy on 05/09/17. Only mild BPH symptoms as of October.  Meds include fish oil (last dose 04/16/17), ASA (last dose 04/16/17), losartan, Protonix, Requip, simvastatin, Flomax, temazepam, Detrol LA.   BP 126/62   Pulse 81   Temp (!) 36.4 C   Resp 20   Ht 6' (1.829 m)   Wt 251 lb (113.9 kg)   SpO2 96%   BMI 34.04 kg/m    EKG 04/21/17: SR with first degree AV block. Non-specific T wave abnormality (high lateral leads). PR interval has lengthened and T wave abnormality in aVL is more prominent when compared to 05/25/13 tracing.  He reported a remote history of a stress test (~ 2003).  CXR 05/22/16: IMPRESSION: Stable.  No evidence of acute disease.  Preoperative labs noted. BUN 20, Cr 1.97. (Previously BUN/Cr 16/1.58 01/03/17, 13/1.33 01/03/16, 15/1.52 12/22/14). 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 I have forwarded BUN/Cr results to Dr. Jenny Reichmann for follow-up purposes and sent a staff message to Dr. Lorin Mercy so he can monitor during hospitalization. Also discussed with anesthesiologist Dr. Tobias Alexander. If patient without acute urological symptoms or other new changes then it is anticipated that patient can proceed as  planned with on-going follow-up of renal function post-operatively.  George Hugh [facility] Short Stay Center/Anesthesiology Phone [phone] 04/22/2017 12:29 PM

## 2017-04-23 ENCOUNTER — Observation Stay (HOSPITAL_COMMUNITY)
Admission: RE | Admit: 2017-04-23 | Discharge: 2017-04-24 | Disposition: A | Discharge status: Home / Self Care | Payer: Medicare Other | Source: Ambulatory Visit | Attending: Orthopaedic Surgery | Admitting: Orthopaedic Surgery

## 2017-04-23 ENCOUNTER — Inpatient Hospital Stay (HOSPITAL_COMMUNITY): Payer: Medicare Other | Admitting: Vascular Surgery

## 2017-04-23 ENCOUNTER — Other Ambulatory Visit: Payer: Self-pay

## 2017-04-23 ENCOUNTER — Inpatient Hospital Stay (HOSPITAL_COMMUNITY): Payer: Medicare Other | Admitting: Certified Registered"

## 2017-04-23 ENCOUNTER — Encounter (HOSPITAL_COMMUNITY)
Admission: RE | Disposition: A | Discharge status: Home / Self Care | Payer: Self-pay | Source: Ambulatory Visit | Attending: Orthopaedic Surgery

## 2017-04-23 ENCOUNTER — Inpatient Hospital Stay (HOSPITAL_COMMUNITY): Payer: Medicare Other

## 2017-04-23 ENCOUNTER — Encounter (HOSPITAL_COMMUNITY): Payer: Self-pay | Admitting: *Deleted

## 2017-04-23 DIAGNOSIS — Z419 Encounter for procedure for purposes other than remedying health state, unspecified: Secondary | ICD-10-CM

## 2017-04-23 DIAGNOSIS — Z6833 Body mass index (BMI) 33.0-33.9, adult: Secondary | ICD-10-CM | POA: Insufficient documentation

## 2017-04-23 DIAGNOSIS — Z791 Long term (current) use of non-steroidal anti-inflammatories (NSAID): Secondary | ICD-10-CM | POA: Insufficient documentation

## 2017-04-23 DIAGNOSIS — Z85828 Personal history of other malignant neoplasm of skin: Secondary | ICD-10-CM | POA: Diagnosis not present

## 2017-04-23 DIAGNOSIS — M48062 Spinal stenosis, lumbar region with neurogenic claudication: Secondary | ICD-10-CM | POA: Diagnosis not present

## 2017-04-23 DIAGNOSIS — Z7982 Long term (current) use of aspirin: Secondary | ICD-10-CM | POA: Diagnosis not present

## 2017-04-23 DIAGNOSIS — M545 Low back pain: Secondary | ICD-10-CM | POA: Diagnosis not present

## 2017-04-23 DIAGNOSIS — I10 Essential (primary) hypertension: Secondary | ICD-10-CM | POA: Diagnosis not present

## 2017-04-23 DIAGNOSIS — Z87891 Personal history of nicotine dependence: Secondary | ICD-10-CM | POA: Insufficient documentation

## 2017-04-23 DIAGNOSIS — G2581 Restless legs syndrome: Secondary | ICD-10-CM | POA: Insufficient documentation

## 2017-04-23 DIAGNOSIS — K219 Gastro-esophageal reflux disease without esophagitis: Secondary | ICD-10-CM | POA: Diagnosis not present

## 2017-04-23 DIAGNOSIS — E785 Hyperlipidemia, unspecified: Secondary | ICD-10-CM | POA: Insufficient documentation

## 2017-04-23 DIAGNOSIS — Z79899 Other long term (current) drug therapy: Secondary | ICD-10-CM | POA: Diagnosis not present

## 2017-04-23 DIAGNOSIS — G4700 Insomnia, unspecified: Secondary | ICD-10-CM | POA: Diagnosis not present

## 2017-04-23 DIAGNOSIS — M48061 Spinal stenosis, lumbar region without neurogenic claudication: Secondary | ICD-10-CM | POA: Diagnosis not present

## 2017-04-23 HISTORY — DX: Urgency of urination: R39.15

## 2017-04-23 HISTORY — PX: LUMBAR LAMINECTOMY/DECOMPRESSION MICRODISCECTOMY: SHX5026

## 2017-04-23 HISTORY — DX: Restless legs syndrome: G25.81

## 2017-04-23 HISTORY — DX: Low back pain: M54.5

## 2017-04-23 HISTORY — DX: Unspecified osteoarthritis, unspecified site: M19.90

## 2017-04-23 HISTORY — PX: DECOMPRESSIVE LUMBAR LAMINECTOMY LEVEL 2: SHX5792

## 2017-04-23 HISTORY — DX: Low back pain, unspecified: M54.50

## 2017-04-23 HISTORY — DX: Hesitancy of micturition: R39.11

## 2017-04-23 HISTORY — DX: Other chronic pain: G89.29

## 2017-04-23 HISTORY — DX: Unspecified malignant neoplasm of skin, unspecified: C44.90

## 2017-04-23 SURGERY — LUMBAR LAMINECTOMY/DECOMPRESSION MICRODISCECTOMY
Anesthesia: General | Site: Spine Lumbar

## 2017-04-23 MED ORDER — FENTANYL CITRATE (PF) 250 MCG/5ML IJ SOLN
INTRAMUSCULAR | Status: DC | PRN
  Administered 2017-04-23: 150 ug via INTRAVENOUS
  Administered 2017-04-23 (×4): 25 ug via INTRAVENOUS

## 2017-04-23 MED ORDER — EPHEDRINE 5 MG/ML INJ
INTRAVENOUS | Status: AC
  Filled 2017-04-23: qty 10

## 2017-04-23 MED ORDER — PHENOL 1.4 % MT LIQD: 1.0000 | OROMUCOSAL | Status: DC | PRN

## 2017-04-23 MED ORDER — ONDANSETRON HCL 4 MG/2ML IJ SOLN: 4.0000 mg | Freq: Once | INTRAMUSCULAR | Status: DC | PRN

## 2017-04-23 MED ORDER — THROMBIN 5000 UNITS EX SOLR
CUTANEOUS | Status: DC | PRN
  Administered 2017-04-23: 5000 [IU] via TOPICAL

## 2017-04-23 MED ORDER — SUGAMMADEX SODIUM 200 MG/2ML IV SOLN
INTRAVENOUS | Status: DC | PRN
  Administered 2017-04-23: 200 mg via INTRAVENOUS

## 2017-04-23 MED ORDER — MENTHOL 3 MG MT LOZG: 1.0000 | LOZENGE | OROMUCOSAL | Status: DC | PRN

## 2017-04-23 MED ORDER — DEXAMETHASONE SODIUM PHOSPHATE 10 MG/ML IJ SOLN
INTRAMUSCULAR | Status: DC | PRN
  Administered 2017-04-23: 8 mg via INTRAVENOUS

## 2017-04-23 MED ORDER — ROCURONIUM BROMIDE 10 MG/ML (PF) SYRINGE
PREFILLED_SYRINGE | INTRAVENOUS | Status: AC
  Filled 2017-04-23: qty 10

## 2017-04-23 MED ORDER — ACETAMINOPHEN 160 MG/5ML PO SOLN: 325.0000 mg | ORAL | Status: DC | PRN

## 2017-04-23 MED ORDER — PANTOPRAZOLE SODIUM 40 MG PO TBEC
40.0000 mg | DELAYED_RELEASE_TABLET | Freq: Two times a day (BID) | ORAL | Status: DC
  Administered 2017-04-23 – 2017-04-24 (×2): 40 mg via ORAL
  Filled 2017-04-23 (×2): qty 1

## 2017-04-23 MED ORDER — MEPERIDINE HCL 25 MG/ML IJ SOLN: 6.2500 mg | INTRAMUSCULAR | Status: DC | PRN

## 2017-04-23 MED ORDER — PROPOFOL 10 MG/ML IV BOLUS
INTRAVENOUS | Status: DC | PRN
  Administered 2017-04-23: 150 mg via INTRAVENOUS

## 2017-04-23 MED ORDER — CHLORHEXIDINE GLUCONATE 4 % EX LIQD: 60.0000 mL | Freq: Once | CUTANEOUS | Status: DC

## 2017-04-23 MED ORDER — ONDANSETRON HCL 4 MG/2ML IJ SOLN: 4.0000 mg | Freq: Four times a day (QID) | INTRAMUSCULAR | Status: DC | PRN

## 2017-04-23 MED ORDER — TESTOSTERONE 50 MG/5GM (1%) TD GEL
10.0000 g | Freq: Every day | TRANSDERMAL | Status: DC
  Filled 2017-04-23: qty 10

## 2017-04-23 MED ORDER — ACETAMINOPHEN 650 MG RE SUPP
650.0000 mg | RECTAL | Status: DC | PRN
Start: 2017-04-23 — End: 2017-04-24

## 2017-04-23 MED ORDER — OXYCODONE HCL 5 MG PO TABS
5.0000 mg | ORAL_TABLET | ORAL | Status: DC | PRN
  Administered 2017-04-24: 10 mg via ORAL
  Administered 2017-04-24: 5 mg via ORAL
  Filled 2017-04-23: qty 2
  Filled 2017-04-23: qty 1
  Filled 2017-04-23: qty 2

## 2017-04-23 MED ORDER — HYDROMORPHONE HCL 1 MG/ML IJ SOLN: 0.5000 mg | INTRAMUSCULAR | Status: DC | PRN

## 2017-04-23 MED ORDER — SODIUM CHLORIDE 0.9 % IV SOLN
1500.0000 mg | INTRAVENOUS | Status: AC
  Administered 2017-04-23 (×2): 1500 mg via INTRAVENOUS
  Filled 2017-04-23: qty 1500

## 2017-04-23 MED ORDER — ONDANSETRON HCL 4 MG/2ML IJ SOLN
INTRAMUSCULAR | Status: DC | PRN
  Administered 2017-04-23: 4 mg via INTRAVENOUS

## 2017-04-23 MED ORDER — ROPINIROLE HCL 1 MG PO TABS
1.0000 mg | ORAL_TABLET | Freq: Every day | ORAL | Status: DC
  Administered 2017-04-23: 1 mg via ORAL
  Filled 2017-04-23: qty 1

## 2017-04-23 MED ORDER — CARBOXYMETHYLCELLULOSE SODIUM 1 % OP SOLN: 1.0000 [drp] | Freq: Two times a day (BID) | OPHTHALMIC | Status: DC

## 2017-04-23 MED ORDER — B COMPLEX PO TABS: 1.0000 | ORAL_TABLET | Freq: Every day | ORAL | Status: DC

## 2017-04-23 MED ORDER — LOSARTAN POTASSIUM 50 MG PO TABS
50.0000 mg | ORAL_TABLET | Freq: Every evening | ORAL | Status: DC
  Filled 2017-04-23: qty 1

## 2017-04-23 MED ORDER — SUGAMMADEX SODIUM 500 MG/5ML IV SOLN
INTRAVENOUS | Status: AC
  Filled 2017-04-23: qty 5

## 2017-04-23 MED ORDER — B COMPLEX-C PO TABS
1.0000 | ORAL_TABLET | Freq: Every day | ORAL | Status: DC
  Filled 2017-04-23 (×2): qty 1

## 2017-04-23 MED ORDER — FENTANYL CITRATE (PF) 100 MCG/2ML IJ SOLN: 25.0000 ug | INTRAMUSCULAR | Status: DC | PRN

## 2017-04-23 MED ORDER — SODIUM CHLORIDE 0.9 % IV SOLN: INTRAVENOUS | Status: DC

## 2017-04-23 MED ORDER — BUPIVACAINE HCL (PF) 0.25 % IJ SOLN
INTRAMUSCULAR | Status: DC | PRN
  Administered 2017-04-23: 15 mL

## 2017-04-23 MED ORDER — TAMSULOSIN HCL 0.4 MG PO CAPS
0.4000 mg | ORAL_CAPSULE | Freq: Every day | ORAL | Status: DC
  Administered 2017-04-24: 0.4 mg via ORAL
  Filled 2017-04-23 (×2): qty 1

## 2017-04-23 MED ORDER — DEXAMETHASONE SODIUM PHOSPHATE 10 MG/ML IJ SOLN
INTRAMUSCULAR | Status: AC
  Filled 2017-04-23: qty 2

## 2017-04-23 MED ORDER — SODIUM CHLORIDE 0.9% FLUSH
3.0000 mL | Freq: Two times a day (BID) | INTRAVENOUS | Status: DC
  Administered 2017-04-23: 3 mL via INTRAVENOUS

## 2017-04-23 MED ORDER — 0.9 % SODIUM CHLORIDE (POUR BTL) OPTIME
TOPICAL | Status: DC | PRN
  Administered 2017-04-23: 1000 mL

## 2017-04-23 MED ORDER — ONDANSETRON HCL 4 MG PO TABS: 4.0000 mg | ORAL_TABLET | Freq: Four times a day (QID) | ORAL | Status: DC | PRN

## 2017-04-23 MED ORDER — POLYETHYLENE GLYCOL 3350 17 G PO PACK: 17.0000 g | PACK | Freq: Every day | ORAL | Status: DC | PRN

## 2017-04-23 MED ORDER — ONDANSETRON HCL 4 MG/2ML IJ SOLN
INTRAMUSCULAR | Status: AC
  Filled 2017-04-23: qty 2

## 2017-04-23 MED ORDER — KETOROLAC TROMETHAMINE 30 MG/ML IJ SOLN: 30.0000 mg | Freq: Four times a day (QID) | INTRAMUSCULAR | Status: DC | PRN

## 2017-04-23 MED ORDER — FESOTERODINE FUMARATE ER 8 MG PO TB24
8.0000 mg | ORAL_TABLET | Freq: Every day | ORAL | Status: DC
Start: 2017-04-23 — End: 2017-04-24
  Administered 2017-04-24: 8 mg via ORAL
  Filled 2017-04-23 (×2): qty 1

## 2017-04-23 MED ORDER — OXYCODONE HCL 5 MG/5ML PO SOLN: 5.0000 mg | Freq: Once | ORAL | Status: DC | PRN

## 2017-04-23 MED ORDER — OXYCODONE-ACETAMINOPHEN 5-325 MG PO TABS
1.0000 | ORAL_TABLET | Freq: Four times a day (QID) | ORAL | 0 refills | Status: DC | PRN
Start: 2017-04-23 — End: 2017-05-28

## 2017-04-23 MED ORDER — PROPOFOL 10 MG/ML IV BOLUS
INTRAVENOUS | Status: AC
  Filled 2017-04-23: qty 20

## 2017-04-23 MED ORDER — THROMBIN (RECOMBINANT) 5000 UNITS EX SOLR
CUTANEOUS | Status: AC
  Filled 2017-04-23: qty 5000

## 2017-04-23 MED ORDER — POLYVINYL ALCOHOL 1.4 % OP SOLN
1.0000 [drp] | Freq: Two times a day (BID) | OPHTHALMIC | Status: DC
  Administered 2017-04-23 – 2017-04-24 (×2): 1 [drp] via OPHTHALMIC
  Filled 2017-04-23: qty 15

## 2017-04-23 MED ORDER — PHENYLEPHRINE HCL 10 MG/ML IJ SOLN
INTRAVENOUS | Status: DC | PRN
  Administered 2017-04-23: 25 ug/min via INTRAVENOUS

## 2017-04-23 MED ORDER — SIMVASTATIN 40 MG PO TABS
40.0000 mg | ORAL_TABLET | Freq: Every evening | ORAL | Status: DC
  Administered 2017-04-23: 40 mg via ORAL
  Filled 2017-04-23: qty 1

## 2017-04-23 MED ORDER — PHENYLEPHRINE 40 MCG/ML (10ML) SYRINGE FOR IV PUSH (FOR BLOOD PRESSURE SUPPORT)
PREFILLED_SYRINGE | INTRAVENOUS | Status: AC
  Filled 2017-04-23: qty 10

## 2017-04-23 MED ORDER — DOCUSATE SODIUM 100 MG PO CAPS
100.0000 mg | ORAL_CAPSULE | Freq: Two times a day (BID) | ORAL | Status: DC
  Administered 2017-04-23: 100 mg via ORAL
  Filled 2017-04-23: qty 1

## 2017-04-23 MED ORDER — BUPIVACAINE HCL (PF) 0.25 % IJ SOLN
INTRAMUSCULAR | Status: AC
  Filled 2017-04-23: qty 30

## 2017-04-23 MED ORDER — SODIUM CHLORIDE 0.9% FLUSH: 3.0000 mL | INTRAVENOUS | Status: DC | PRN

## 2017-04-23 MED ORDER — ACETAMINOPHEN 325 MG PO TABS: 325.0000 mg | ORAL_TABLET | ORAL | Status: DC | PRN

## 2017-04-23 MED ORDER — METHOCARBAMOL 500 MG PO TABS: 500.0000 mg | ORAL_TABLET | Freq: Four times a day (QID) | ORAL | 0 refills | Status: DC | PRN

## 2017-04-23 MED ORDER — OXYCODONE HCL 5 MG PO TABS: 5.0000 mg | ORAL_TABLET | Freq: Once | ORAL | Status: DC | PRN

## 2017-04-23 MED ORDER — PHENYLEPHRINE 40 MCG/ML (10ML) SYRINGE FOR IV PUSH (FOR BLOOD PRESSURE SUPPORT)
PREFILLED_SYRINGE | INTRAVENOUS | Status: DC | PRN
  Administered 2017-04-23: 80 ug via INTRAVENOUS
  Administered 2017-04-23: 40 ug via INTRAVENOUS
  Administered 2017-04-23: 80 ug via INTRAVENOUS

## 2017-04-23 MED ORDER — LIDOCAINE 2% (20 MG/ML) 5 ML SYRINGE
INTRAMUSCULAR | Status: AC
  Filled 2017-04-23: qty 5

## 2017-04-23 MED ORDER — SODIUM CHLORIDE 0.9 % IV SOLN
250.0000 mL | INTRAVENOUS | Status: DC
Start: 2017-04-23 — End: 2017-04-24

## 2017-04-23 MED ORDER — LACTATED RINGERS IV SOLN
INTRAVENOUS | Status: DC
  Administered 2017-04-23 (×2): via INTRAVENOUS

## 2017-04-23 MED ORDER — FENTANYL CITRATE (PF) 250 MCG/5ML IJ SOLN
INTRAMUSCULAR | Status: AC
  Filled 2017-04-23: qty 5

## 2017-04-23 MED ORDER — ROCURONIUM BROMIDE 10 MG/ML (PF) SYRINGE
PREFILLED_SYRINGE | INTRAVENOUS | Status: DC | PRN
  Administered 2017-04-23: 20 mg via INTRAVENOUS
  Administered 2017-04-23: 100 mg via INTRAVENOUS

## 2017-04-23 MED ORDER — LIDOCAINE 2% (20 MG/ML) 5 ML SYRINGE
INTRAMUSCULAR | Status: DC | PRN
  Administered 2017-04-23: 80 mg via INTRAVENOUS

## 2017-04-23 MED ORDER — ACETAMINOPHEN 325 MG PO TABS: 650.0000 mg | ORAL_TABLET | ORAL | Status: DC | PRN

## 2017-04-23 SURGICAL SUPPLY — 50 items
BENZOIN TINCTURE PRP APPL 2/3 (GAUZE/BANDAGES/DRESSINGS) ×2 IMPLANT
BUR ROUND FLUTED 4 SOFT TCH (BURR) IMPLANT
CANISTER SUCT 3000ML PPV (MISCELLANEOUS) ×2 IMPLANT
CLSR STERI-STRIP ANTIMIC 1/2X4 (GAUZE/BANDAGES/DRESSINGS) IMPLANT
COVER SURGICAL LIGHT HANDLE (MISCELLANEOUS) ×2 IMPLANT
DECANTER SPIKE VIAL GLASS SM (MISCELLANEOUS) ×2 IMPLANT
DERMABOND ADVANCED (GAUZE/BANDAGES/DRESSINGS) ×1
DERMABOND ADVANCED .7 DNX12 (GAUZE/BANDAGES/DRESSINGS) ×1 IMPLANT
DRAPE HALF SHEET 40X57 (DRAPES) ×4 IMPLANT
DRAPE MICROSCOPE LEICA (MISCELLANEOUS) ×2 IMPLANT
DRAPE SURG 17X23 STRL (DRAPES) ×2 IMPLANT
DRSG MEPILEX BORDER 4X4 (GAUZE/BANDAGES/DRESSINGS) IMPLANT
DRSG MEPILEX BORDER 4X8 (GAUZE/BANDAGES/DRESSINGS) ×2 IMPLANT
DURAPREP 26ML APPLICATOR (WOUND CARE) ×2 IMPLANT
ELECT BLADE 4.0 EZ CLEAN MEGAD (MISCELLANEOUS) ×2
ELECT REM PT RETURN 9FT ADLT (ELECTROSURGICAL) ×2
ELECTRODE BLDE 4.0 EZ CLN MEGD (MISCELLANEOUS) ×1 IMPLANT
ELECTRODE REM PT RTRN 9FT ADLT (ELECTROSURGICAL) ×1 IMPLANT
GLOVE BIOGEL PI IND STRL 8 (GLOVE) ×2 IMPLANT
GLOVE BIOGEL PI INDICATOR 8 (GLOVE) ×2
GLOVE ORTHO TXT STRL SZ7.5 (GLOVE) ×6 IMPLANT
GOWN STRL REUS W/ TWL LRG LVL3 (GOWN DISPOSABLE) ×1 IMPLANT
GOWN STRL REUS W/ TWL XL LVL3 (GOWN DISPOSABLE) ×1 IMPLANT
GOWN STRL REUS W/TWL 2XL LVL3 (GOWN DISPOSABLE) ×2 IMPLANT
GOWN STRL REUS W/TWL LRG LVL3 (GOWN DISPOSABLE) ×1
GOWN STRL REUS W/TWL XL LVL3 (GOWN DISPOSABLE) ×1
KIT BASIN OR (CUSTOM PROCEDURE TRAY) ×2 IMPLANT
KIT ROOM TURNOVER OR (KITS) ×2 IMPLANT
MANIFOLD NEPTUNE II (INSTRUMENTS) ×2 IMPLANT
NEEDLE HYPO 25GX1X1/2 BEV (NEEDLE) ×2 IMPLANT
NEEDLE SPNL 18GX3.5 QUINCKE PK (NEEDLE) ×4 IMPLANT
NS IRRIG 1000ML POUR BTL (IV SOLUTION) ×2 IMPLANT
PACK LAMINECTOMY ORTHO (CUSTOM PROCEDURE TRAY) ×2 IMPLANT
PAD ARMBOARD 7.5X6 YLW CONV (MISCELLANEOUS) ×4 IMPLANT
PATTIES SURGICAL .5 X.5 (GAUZE/BANDAGES/DRESSINGS) ×2 IMPLANT
PATTIES SURGICAL .75X.75 (GAUZE/BANDAGES/DRESSINGS) ×2 IMPLANT
SPOGE SURGIFLO 8M (HEMOSTASIS) ×1
SPONGE SURGIFLO 8M (HEMOSTASIS) ×1 IMPLANT
STRIP CLOSURE SKIN 1/2X4 (GAUZE/BANDAGES/DRESSINGS) ×2 IMPLANT
SUT VIC AB 0 CT1 27 (SUTURE)
SUT VIC AB 0 CT1 27XBRD ANBCTR (SUTURE) IMPLANT
SUT VIC AB 1 CT1 27 (SUTURE) ×2
SUT VIC AB 1 CT1 27XBRD ANBCTR (SUTURE) ×2 IMPLANT
SUT VIC AB 1 CTX 36 (SUTURE) ×1
SUT VIC AB 1 CTX36XBRD ANBCTR (SUTURE) ×1 IMPLANT
SUT VIC AB 2-0 CT1 27 (SUTURE) ×1
SUT VIC AB 2-0 CT1 TAPERPNT 27 (SUTURE) ×1 IMPLANT
SUT VIC AB 3-0 X1 27 (SUTURE) ×2 IMPLANT
TOWEL OR 17X24 6PK STRL BLUE (TOWEL DISPOSABLE) IMPLANT
TOWEL OR 17X26 10 PK STRL BLUE (TOWEL DISPOSABLE) ×2 IMPLANT

## 2017-04-23 NOTE — Interval H&P Note (Signed)
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 After consideration of risks, benefits and other options for treatment, the patient has consented to  Procedure(s): L3-4, L4-5 DECOMPRESSION (N/A) as a surgical intervention .  The patient's history has been reviewed, patient examined, no change in status, stable for surgery.  I have reviewed the patient's chart and labs.  Questions were answered to the patient's satisfaction.     Marybelle Killings

## 2017-04-23 NOTE — Progress Notes (Signed)
76 YO M s/p spinal, on vancomycin for surgical prophylaxis d/t penicillin allergy, pharmacy to adjust dosage base one renal function. Scr on 1.97 on 11/26, est. crcl ~ 40 ml/min. Received pre-op dose vancomycin at around noon. No drain per chart  Plan: No re-dose needed, the pre-op dose should keep the drug level therapeutic for 24 hrs given his crcl is ~1ml/min Pharmacy sign off.  Thanks.   Maryanna Shape, PharmD, BCPS  Clinical Pharmacist  Pager: [phone]

## 2017-04-23 NOTE — Anesthesia Preprocedure Evaluation (Signed)
Anesthesia Evaluation   Patient awake    Reviewed: Allergy & Precautions, NPO status , Patient's Chart, lab work & pertinent test results  History of Anesthesia Complications Negative for: history of anesthetic complications  Airway Mallampati: IV  TM Distance: >3 FB Neck ROM: Full    Dental  (+) Teeth Intact,    Pulmonary neg shortness of breath, neg sleep apnea, neg COPD, neg recent URI, former smoker,    breath sounds clear to auscultation       Cardiovascular hypertension, Pt. on medications (-) angina(-) Past MI and (-) CHF  Rhythm:Regular     Neuro/Psych  Headaches,  Neuromuscular disease negative psych ROS   GI/Hepatic Neg liver ROS, GERD  Medicated and Controlled,  Endo/Other  Morbid obesity  Renal/GU negative Renal ROS     Musculoskeletal negative musculoskeletal ROS (+)   Abdominal   Peds  Hematology negative hematology ROS (+)   Anesthesia Other Findings   Reproductive/Obstetrics                             Anesthesia Physical Anesthesia Plan  ASA: II  Anesthesia Plan: General   Post-op Pain Management:    Induction: Intravenous  PONV Risk Score and Plan: 2 and Ondansetron and Dexamethasone  Airway Management Planned: Oral ETT  Additional Equipment: None  Intra-op Plan:   Post-operative Plan: Extubation in OR  Informed Consent: I have reviewed the patients History and Physical, chart, labs and discussed the procedure including the risks, benefits and alternatives for the proposed anesthesia with the patient or authorized representative who has indicated his/her understanding and acceptance.   Dental advisory given  Plan Discussed with: CRNA and Surgeon  Anesthesia Plan Comments:         Anesthesia Quick Evaluation

## 2017-04-23 NOTE — Anesthesia Procedure Notes (Signed)
Procedure Name: Intubation Date/Time: 04/23/2017 12:58 PM Performed by: Myna Bright, CRNA Pre-anesthesia Checklist: Patient identified, Emergency Drugs available, Suction available and Patient being monitored Patient Re-evaluated:Patient Re-evaluated prior to induction Oxygen Delivery Method: Circle system utilized Preoxygenation: Pre-oxygenation with 100% oxygen Induction Type: IV induction Ventilation: Mask ventilation without difficulty Laryngoscope Size: Glidescope and 3 Tube type: Oral Tube size: 7.5 mm Number of attempts: 1 Airway Equipment and Method: Video-laryngoscopy and Stylet Placement Confirmation: ETT inserted through vocal cords under direct vision,  positive ETCO2 and breath sounds checked- equal and bilateral Secured at: 22 cm Tube secured with: Tape Dental Injury: Teeth and Oropharynx as per pre-operative assessment  Comments: Glidescope used d/t MP III. Easy mask airway. Good view with glidescope, atraumatic intubation.

## 2017-04-23 NOTE — Transfer of Care (Signed)
Immediate Anesthesia Transfer of Care Note  Patient: Alex Kemp  Procedure(s) Performed: L3-4, L4-5 DECOMPRESSION (N/A Spine Lumbar)  Patient Location: PACU  Anesthesia Type:General  Level of Consciousness: awake, alert  and oriented  Airway & Oxygen Therapy: Patient Spontanous Breathing and Patient connected to nasal cannula oxygen  Post-op Assessment: Report given to RN and Post -op Vital signs reviewed and stable  Post vital signs: Reviewed and stable  Last Vitals:  Vitals:   04/23/17 1031 04/23/17 1545  BP: (!) 157/80   Pulse: 75   Resp: 18   Temp: 36.6 C 36.6 C  SpO2: 98%     Last Pain:  Vitals:   04/23/17 1031  TempSrc: Oral      Patients Stated Pain Goal: 5 (88/28/00 3491)  Complications: No apparent anesthesia complications
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
## 2017-04-23 NOTE — Op Note (Addendum)
Preop diagnosis: Lumbar spinal stenosis, multilevel with neurogenic claudication.  Postop diagnosis: Same  Procedure: Laminectomy with exploration and decompression spinal canal,  laminectomy L3, L4 , And  L5.   Surgeon: Rodell Perna MD  Anesthesia: General  Assistant: Benjiman Core PA-C medically necessary and present for the entire procedure.  EBL: See anesthesia record  Procedure: After the induction of general anesthesia patient was placed prone on chest rolls careful padding and positioning.  Right shoulder was stiff extra pads were added anteriorly to the right shoulder excessive external rotation padding over the ulnar nerves.  Chest rolls were used preoperative vancomycin timeout procedure after prepping with DuraPrep.  The area squared with towels Betadine Steri-Drape applied and the initial incision was made based on palpable landmarks to spinal needles placed x-ray was taken needles were adjusted continue exposure onto the lamina of L3-L4 and L5.  Self-retaining retractor was placed Coker clamps were placed repeat x-ray was taken laterally.  Complete laminectomy was done at L4 and L3.  The top portion of L5 was taken due to its involvement in area of severe stenosis hypertrophic ligamentum.  Thick chunks of ligament were removed decompressing the dura and removing overhanging spurs laterally off the facet out to the lateral gutter and level of the pedicle central decompression.  The dura was protected with patties and overhanging spurs and chunks of ligament removed on each side until the dural tube was round and decompressed.  X-ray was taken and there was some additional areas cephalad than needed decompression more cephalad to reach the area where there was normal epidural fat surrounding the dura and no area of compression.  A filiform was placed cephalad and caudad at the area after completing decompression final x-ray taken confirming decompression in the area corresponded MRI scan where  there was stenosis.  The operative microscope was used as soon as the lamina was exposed for decompression and visualization throughout the procedure.  Surgi-Flo was placed in the gutters for some epidural bleeders and bipolar cautery was used additionally.  Operative field was dry dural bleeding the end of the case.  Gutters were checked to make sure all chunks of ligament and overhanging spurs had been removed not causing central compression.  #1 Vicryl sutures in the fascia until Vicryl subtendinous tissue skin closure and postop dressing was performed.  Patient tolerated the procedure well to the recovery room in stable condition.

## 2017-04-24 ENCOUNTER — Telehealth: Payer: Self-pay | Admitting: *Deleted

## 2017-04-24 ENCOUNTER — Encounter (HOSPITAL_COMMUNITY): Payer: Self-pay | Admitting: Orthopaedic Surgery

## 2017-04-24 DIAGNOSIS — M48062 Spinal stenosis, lumbar region with neurogenic claudication: Secondary | ICD-10-CM | POA: Diagnosis not present

## 2017-04-24 DIAGNOSIS — G2581 Restless legs syndrome: Secondary | ICD-10-CM | POA: Diagnosis not present

## 2017-04-24 DIAGNOSIS — E785 Hyperlipidemia, unspecified: Secondary | ICD-10-CM | POA: Diagnosis not present

## 2017-04-24 DIAGNOSIS — I10 Essential (primary) hypertension: Secondary | ICD-10-CM | POA: Diagnosis not present

## 2017-04-24 DIAGNOSIS — G4700 Insomnia, unspecified: Secondary | ICD-10-CM | POA: Diagnosis not present

## 2017-04-24 DIAGNOSIS — K219 Gastro-esophageal reflux disease without esophagitis: Secondary | ICD-10-CM | POA: Diagnosis not present

## 2017-04-24 NOTE — Discharge Instructions (Signed)
Walk daily. Avoid lifting , repetitive bending, twisting. OK to shower. See Dr. Lorin Mercy in one week.

## 2017-04-24 NOTE — Telephone Encounter (Signed)
Pt was on TCM report admitted for observations for Back pain and lower extremity radiculopathy. 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 Failed conservative treatment. Progressively worsening symptoms. We will proceed with L3-4, L4-5 DECOMPRESSION as scheduled. Surgical procedure along with possible risks. Will ne f/u w/specialist.../lmb

## 2017-04-24 NOTE — Progress Notes (Signed)
   Subjective: 1 Day Post-Op Procedure(s) (LRB): L3-4, L4-5 DECOMPRESSION (N/A) Patient reports pain as moderate.    Objective: Vital signs in last 24 hours: Temp:  [97.6 F (36.4 C)-98.5 F (36.9 C)] 97.6 F (36.4 C) (11/29 6812) Pulse Rate:  [64-85] 85 (11/29 0642) Resp:  [8-18] 16 (11/29 0642) BP: (100-157)/(38-80) 150/68 (11/29 0642) SpO2:  [94 %-99 %] 96 % (11/29 0642) Weight:  [250 lb (113.4 kg)-251 lb (113.9 kg)] 250 lb (113.4 kg) (11/28 1802)  Intake/Output from previous day: 11/28 0701 - 11/29 0700 In: 1020 [P.O.:120; I.V.:900] Out: 200 [Blood:200] Intake/Output this shift: No intake/output data recorded.  Recent Labs    04/21/17 1459  HGB 14.5   Recent Labs    04/21/17 1459  WBC 6.7  RBC 5.30  HCT 44.9  PLT 181   Recent Labs    04/21/17 1459  NA 141  K 4.1  CL 109  CO2 27  BUN 20  CREATININE 1.97*  GLUCOSE 160*  CALCIUM 8.8*   Recent Labs    04/21/17 1459  INR 0.95    Neurologically intact Dg Lumbar Spine Complete  Result Date: 04/23/2017 CLINICAL DATA:  Intraoperative localization films in patient for L3-4 and L4-5 decompression. EXAM: LUMBAR SPINE - COMPLETE 4+ VIEW COMPARISON:  Two views lumbar spine 03/04/2017. MRI lumbar spine 01/23/2017. FINDINGS: Four intraoperative views of the lumbar spine in the lateral projection are provided. On the first image, probes are at the level of the L3 pedicles and just below the level of the L4 pedicles. On the second image, probes are at the level of the L3 pedicles and directed toward the L4-5 disc interspace. On the third image, clamps are on the L4 and L5 spinous processes. Probes are seen just below the level of the L3-4 disc interspace at L5-S1 disc interspace. On the last image, probes are seen just below the level of the L3 pedicles and at the level of the L5 pedicles. IMPRESSION: Intraoperative localization as described above. Electronically Signed   By: Inge Rise M.D.   On: 04/23/2017 15:11      Assessment/Plan: 1 Day Post-Op Procedure(s) (LRB): L3-4, L4-5 DECOMPRESSION (N/A) Plan: discharge home. Office one week.   Marybelle Killings 04/24/2017, 8:04 AM

## 2017-04-24 NOTE — Care Management Obs Status (Signed)
[facility] NOTIFICATION   Patient Details  Name: Alex Kemp MRN: [id number] Date of Birth: 1941-04-12   Medicare Observation Status Notification Given:  Yes    Pollie Friar, RN 04/24/2017, 10:33 AM

## 2017-04-24 NOTE — Anesthesia Postprocedure Evaluation (Signed)
Anesthesia Post Note  Patient: Alex Kemp  Procedure(s) Performed: L3-4, L4-5 DECOMPRESSION (N/A Spine Lumbar)     Patient location during evaluation: PACU Anesthesia Type: General Level of consciousness: awake and alert Pain management: pain level controlled Vital Signs Assessment: post-procedure vital signs reviewed and stable Respiratory status: spontaneous breathing, nonlabored ventilation, respiratory function stable and patient connected to nasal cannula oxygen Cardiovascular status: blood pressure returned to baseline and stable Postop Assessment: no apparent nausea or vomiting Anesthetic complications: no    Last Vitals:  Vitals:   04/24/17 0642 04/24/17 1011  BP: (!) 150/68 (!) 144/57  Pulse: 85 89  Resp: 16 18  Temp: 36.4 C   SpO2: 96% 97%    Last Pain:  Vitals:   04/24/17 0800  TempSrc:   PainSc: 2                  Tiajuana Amass

## 2017-04-24 NOTE — Progress Notes (Signed)
Patient discharged home with spouse. Discharge information and prescriptions given. Patient and spouse questions asked and answered. Patient transported from unit via wheelchair with volunteer services. Wendee Copp

## 2017-04-28 ENCOUNTER — Telehealth (INDEPENDENT_AMBULATORY_CARE_PROVIDER_SITE_OTHER): Payer: Self-pay | Admitting: Radiology

## 2017-04-28 NOTE — Telephone Encounter (Signed)
I called discussed. Has appt tomorrow. Needs to walk more.

## 2017-04-28 NOTE — Telephone Encounter (Signed)
Bethena Roys called and said that patient is sleeping all the time, not eating much at all.  He is NOT taking any pain meds.  She says the dressing is still on, but it appears to only be the old drainage under it.  She has not changed it.  There is no heat/redness/swelling around the bandage.  She is concerned about him.  His temperature is up to 101.9 at night and 99-100 during the day.   He also is scheduled for a prostate biopsy tomorrow at 9am, and they want to know if it is still ok to keep that appointment for the biopsy?  I scheduled a postop appt tomorrow afternoon 115pm with you.  Please advise on all this and let me know if you call them or I need to call?  Thanks.

## 2017-04-29 ENCOUNTER — Telehealth (INDEPENDENT_AMBULATORY_CARE_PROVIDER_SITE_OTHER): Payer: Self-pay | Admitting: Orthopaedic Surgery

## 2017-04-29 ENCOUNTER — Encounter (INDEPENDENT_AMBULATORY_CARE_PROVIDER_SITE_OTHER): Payer: Self-pay | Admitting: Orthopaedic Surgery

## 2017-04-29 ENCOUNTER — Ambulatory Visit (INDEPENDENT_AMBULATORY_CARE_PROVIDER_SITE_OTHER): Payer: Medicare Other | Admitting: Orthopaedic Surgery

## 2017-04-29 VITALS — BP 113/68 | HR 79 | Temp 97.7°F

## 2017-04-29 DIAGNOSIS — Z9889 Other specified postprocedural states: Secondary | ICD-10-CM | POA: Insufficient documentation

## 2017-04-29 DIAGNOSIS — T148XXA Other injury of unspecified body region, initial encounter: Secondary | ICD-10-CM

## 2017-04-29 MED ORDER — MUPIROCIN 2 % EX OINT: TOPICAL_OINTMENT | CUTANEOUS | 1 refills | Status: DC

## 2017-04-29 MED ORDER — MUPIROCIN 2 % EX OINT: TOPICAL_OINTMENT | Freq: Every day | CUTANEOUS | Status: DC

## 2017-04-29 NOTE — Telephone Encounter (Signed)
Sent to pharmacy. I called Bethena Roys and advised. Per Dr Lorin Mercy, only one medication, the Bactroban. Judy aware.

## 2017-04-29 NOTE — Telephone Encounter (Signed)
Alex Kemp, [place], for the patient left a voicemail message stating that the pharmacy has not received the 2 prescriptions.  She would like the prescriptions sent to the [facility] on [address] @ [employer].  HS#[phone].  Thank you.
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
## 2017-05-02 ENCOUNTER — Inpatient Hospital Stay (INDEPENDENT_AMBULATORY_CARE_PROVIDER_SITE_OTHER): Payer: Medicare Other | Admitting: Orthopaedic Surgery

## 2017-05-02 ENCOUNTER — Telehealth (INDEPENDENT_AMBULATORY_CARE_PROVIDER_SITE_OTHER): Payer: Self-pay | Admitting: Orthopaedic Surgery

## 2017-05-02 NOTE — Telephone Encounter (Signed)
Alex Kemp called requesting a Home Health nurse to change the bandage next time it needs to be changed. She"s not sure when it should be done. She just can't do this again.  Want you to call her back. She had to call MD on call(XU) to direct her on how to do it.  Please call her

## 2017-05-02 NOTE — Telephone Encounter (Signed)
advise

## 2017-05-02 NOTE — Telephone Encounter (Signed)
noted 

## 2017-05-02 NOTE — Telephone Encounter (Signed)
I called discussed.  

## 2017-05-10 ENCOUNTER — Other Ambulatory Visit (INDEPENDENT_AMBULATORY_CARE_PROVIDER_SITE_OTHER): Payer: Self-pay | Admitting: Orthopaedic Surgery

## 2017-05-10 ENCOUNTER — Encounter (INDEPENDENT_AMBULATORY_CARE_PROVIDER_SITE_OTHER): Payer: Self-pay | Admitting: Orthopaedic Surgery

## 2017-05-14 ENCOUNTER — Ambulatory Visit (INDEPENDENT_AMBULATORY_CARE_PROVIDER_SITE_OTHER): Payer: Medicare Other | Admitting: Orthopaedic Surgery

## 2017-05-14 ENCOUNTER — Encounter (INDEPENDENT_AMBULATORY_CARE_PROVIDER_SITE_OTHER): Payer: Self-pay | Admitting: Orthopaedic Surgery

## 2017-05-14 VITALS — BP 107/71 | HR 84 | Temp 96.7°F | Ht 72.0 in | Wt 227.0 lb

## 2017-05-14 DIAGNOSIS — Z9889 Other specified postprocedural states: Secondary | ICD-10-CM
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
## 2017-05-24 ENCOUNTER — Encounter: Payer: Self-pay | Admitting: Internal Medicine

## 2017-05-28 ENCOUNTER — Encounter: Payer: Self-pay | Admitting: Internal Medicine

## 2017-05-28 ENCOUNTER — Other Ambulatory Visit (INDEPENDENT_AMBULATORY_CARE_PROVIDER_SITE_OTHER): Payer: Medicare Other

## 2017-05-28 ENCOUNTER — Ambulatory Visit (INDEPENDENT_AMBULATORY_CARE_PROVIDER_SITE_OTHER): Payer: Medicare Other | Admitting: Internal Medicine

## 2017-05-28 ENCOUNTER — Ambulatory Visit (INDEPENDENT_AMBULATORY_CARE_PROVIDER_SITE_OTHER)
Admission: RE | Admit: 2017-05-28 | Discharge: 2017-05-28 | Disposition: A | Discharge status: Home / Self Care | Payer: Medicare Other | Source: Ambulatory Visit | Attending: Internal Medicine | Admitting: Internal Medicine

## 2017-05-28 VITALS — BP 128/74 | HR 119 | Temp 97.8°F | Ht 72.0 in | Wt 234.0 lb

## 2017-05-28 DIAGNOSIS — R972 Elevated prostate specific antigen [PSA]: Secondary | ICD-10-CM

## 2017-05-28 DIAGNOSIS — I10 Essential (primary) hypertension: Secondary | ICD-10-CM

## 2017-05-28 DIAGNOSIS — R531 Weakness: Secondary | ICD-10-CM | POA: Diagnosis not present

## 2017-05-28 DIAGNOSIS — E291 Testicular hypofunction: Secondary | ICD-10-CM

## 2017-05-28 DIAGNOSIS — R0602 Shortness of breath: Secondary | ICD-10-CM | POA: Diagnosis not present

## 2017-05-28 LAB — CBC WITH DIFFERENTIAL/PLATELET
BASOS PCT: 0.6 % (ref 0.0–3.0)
Basophils Absolute: 0 10*3/uL (ref 0.0–0.1)
EOS ABS: 0.4 10*3/uL (ref 0.0–0.7)
Eosinophils Relative: 4.6 % (ref 0.0–5.0)
HEMATOCRIT: 36.1 % — AB (ref 39.0–52.0)
HEMOGLOBIN: 12.4 g/dL — AB (ref 13.0–17.0)
Lymphocytes Relative: 28.1 % (ref 12.0–46.0)
Lymphs Abs: 2.2 10*3/uL (ref 0.7–4.0)
MCHC: 34.2 g/dL (ref 30.0–36.0)
MCV: 87.7 fl (ref 78.0–100.0)
Monocytes Absolute: 0.7 10*3/uL (ref 0.1–1.0)
Monocytes Relative: 8.8 % (ref 3.0–12.0)
Neutro Abs: 4.4 10*3/uL (ref 1.4–7.7)
Neutrophils Relative %: 57.9 % (ref 43.0–77.0)
Platelets: 189 10*3/uL (ref 150.0–400.0)
RBC: 4.12 Mil/uL — AB (ref 4.22–5.81)
RDW: 16.7 % — AB (ref 11.5–15.5)
WBC: 7.7 10*3/uL (ref 4.0–10.5)

## 2017-05-28 LAB — BASIC METABOLIC PANEL
BUN: 19 mg/dL (ref 6–23)
CALCIUM: 8.6 mg/dL (ref 8.4–10.5)
CO2: 26 mEq/L (ref 19–32)
Chloride: 106 mEq/L (ref 96–112)
Creatinine, Ser: 1.68 mg/dL — ABNORMAL HIGH (ref 0.40–1.50)
GFR: 42.48 mL/min — AB (ref >60.00)
GLUCOSE: 124 mg/dL — AB (ref 70–99)
POTASSIUM: 4.5 meq/L (ref 3.5–5.1)
Sodium: 140 mEq/L (ref 135–145)

## 2017-05-28 LAB — HEPATIC FUNCTION PANEL
ALT: 17 U/L (ref 0–53)
AST: 13 U/L (ref 0–37)
Albumin: 3.7 g/dL (ref 3.5–5.2)
Alkaline Phosphatase: 144 U/L — ABNORMAL HIGH (ref 39–117)
BILIRUBIN DIRECT: 0.1 mg/dL (ref 0.0–0.3)
Total Bilirubin: 0.5 mg/dL (ref 0.2–1.2)
Total Protein: 6.8 g/dL (ref 6.0–8.3)

## 2017-05-28 LAB — URINALYSIS, ROUTINE W REFLEX MICROSCOPIC
Bilirubin Urine: NEGATIVE
Hgb urine dipstick: NEGATIVE
Ketones, ur: NEGATIVE
Leukocytes, UA: NEGATIVE
Nitrite: NEGATIVE
PH: 5.5 (ref 5.0–8.0)
SPECIFIC GRAVITY, URINE: 1.025 (ref 1.000–1.030)
Total Protein, Urine: NEGATIVE
Urine Glucose: NEGATIVE
Urobilinogen, UA: 0.2 (ref 0.0–1.0)

## 2017-05-28 LAB — TESTOSTERONE: TESTOSTERONE: 391.21 ng/dL (ref 300.00–890.00)

## 2017-05-28 LAB — TSH: TSH: 2.14 u[IU]/mL (ref 0.35–4.50)
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
## 2017-05-28 NOTE — Assessment & Plan Note (Signed)
For prostate bx soon when f/u with urology

## 2017-05-28 NOTE — Assessment & Plan Note (Signed)
stable overall by history and exam, recent data reviewed with pt, and pt to continue medical treatment as before,  to f/u any worsening symptoms or concerns BP Readings from Last 3 Encounters:  05/28/17 128/74  05/14/17 107/71  04/29/17 113/68

## 2017-05-28 NOTE — Assessment & Plan Note (Signed)
Also for testosterone level, cbc  to f/u any worsening symptoms or concerns

## 2017-05-28 NOTE — Assessment & Plan Note (Addendum)
Mild, may be subjectively improving, maybe related to longer postop recovery of stamina, for labs today as ordered,  to f/u any worsening symptoms or concerns  Note:  Total time for pt hx, exam, review of record with pt in the room, determination of diagnoses and plan for further eval and tx is > 40 min, with over 50% spent in coordination and counseling of patient including the differential dx, tx, further evaluation and other management of generalized weakness, elevated PSA, HTN, hypogonadism

## 2017-05-28 NOTE — Patient Instructions (Addendum)
Please continue all other medications as before, and refills have been done if requested.  Please have the pharmacy call with any other refills you may need.  Please continue your efforts at being more active, low cholesterol diet, and weight control.  Please keep your appointments with your specialists as you may have plann  Please go to the XRAY Department in the Basement (go straight as you get off the elevator) for the x-ray testing  Please go to the LAB in the Basement (turn left off the elevator) for the tests to be done today  You will be contacted by phone if any changes need to be made immediately.  Otherwise, you will receive a letter about your results with an explanation, but please check with MyChart first.  Please remember to sign up for MyChart if you have not done so, as this will be important to you in the future with finding out test results, communicating by private email, and scheduling acute appointments online when needed.  Please return in 6 months, or sooner if needed, with Lab testing done 3-5 days before  [facility] to cancel the Feb 11 appointment  Good luck with your prostate biopsy

## 2017-05-29 ENCOUNTER — Other Ambulatory Visit (INDEPENDENT_AMBULATORY_CARE_PROVIDER_SITE_OTHER): Payer: Medicare Other

## 2017-05-29 DIAGNOSIS — R739 Hyperglycemia, unspecified: Secondary | ICD-10-CM

## 2017-05-29 LAB — HEMOGLOBIN A1C: HEMOGLOBIN A1C: 6.4 % (ref 4.6–6.5)

## 2017-06-04 ENCOUNTER — Encounter (INDEPENDENT_AMBULATORY_CARE_PROVIDER_SITE_OTHER): Payer: Self-pay | Admitting: Orthopaedic Surgery

## 2017-06-04 ENCOUNTER — Ambulatory Visit (INDEPENDENT_AMBULATORY_CARE_PROVIDER_SITE_OTHER): Payer: Medicare Other | Admitting: Orthopaedic Surgery

## 2017-06-04 VITALS — BP 155/86 | HR 79 | Ht 72.0 in | Wt 232.0 lb

## 2017-06-04 DIAGNOSIS — Z9889 Other specified postprocedural states: Secondary | ICD-10-CM
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
## 2017-07-01 ENCOUNTER — Other Ambulatory Visit: Payer: Self-pay | Admitting: Internal Medicine

## 2017-07-01 NOTE — Telephone Encounter (Signed)
[facility] er[facility]

## 2017-07-07 ENCOUNTER — Ambulatory Visit: Payer: Medicare Other | Admitting: Internal Medicine

## 2017-07-08 DIAGNOSIS — Z85828 Personal history of other malignant neoplasm of skin: Secondary | ICD-10-CM | POA: Diagnosis not present

## 2017-07-08 DIAGNOSIS — C44529 Squamous cell carcinoma of skin of other part of trunk: Secondary | ICD-10-CM | POA: Diagnosis not present

## 2017-07-08 DIAGNOSIS — L570 Actinic keratosis: Secondary | ICD-10-CM | POA: Diagnosis not present

## 2017-08-08 ENCOUNTER — Other Ambulatory Visit: Payer: Self-pay | Admitting: Internal Medicine

## 2017-08-11 ENCOUNTER — Other Ambulatory Visit: Payer: Self-pay | Admitting: *Deleted

## 2017-08-11 ENCOUNTER — Telehealth: Payer: Self-pay | Admitting: Internal Medicine

## 2017-08-11 MED ORDER — PANTOPRAZOLE SODIUM 40 MG PO TBEC: 40.0000 mg | DELAYED_RELEASE_TABLET | Freq: Two times a day (BID) | ORAL | 3 refills | Status: DC

## 2017-08-11 NOTE — Telephone Encounter (Signed)
Copied from [place] [phone]. Topic: Quick Communication - Rx Refill/Question >> Aug 11, 2017  8:14 AM Celedonio Savage L wrote: Medication: pantoprazole (PROTONIX) 40 MG tablet     Patient has two days of this medicine left   Has the patient contacted their pharmacy? Yes.  Pharmacy called pt   (Agent: If no, request that the patient contact the pharmacy for the refill.)   Preferred Pharmacy (with phone number or street name): [facility] [place] [place] [facility] [phone] (Phone) [phone] (Fax)     Agent: Please be advised that RX refills may take up to 3 business days. We ask that you follow-up with your pharmacy.

## 2017-09-04 DIAGNOSIS — R972 Elevated prostate specific antigen [PSA]: Secondary | ICD-10-CM | POA: Diagnosis not present

## 2017-09-08 ENCOUNTER — Other Ambulatory Visit: Payer: Self-pay | Admitting: Internal Medicine

## 2017-09-08 NOTE — Telephone Encounter (Signed)
Done erx 

## 2017-09-10 DIAGNOSIS — R339 Retention of urine, unspecified: Secondary | ICD-10-CM | POA: Insufficient documentation

## 2017-09-10 DIAGNOSIS — Z466 Encounter for fitting and adjustment of urinary device: Secondary | ICD-10-CM | POA: Diagnosis not present

## 2017-09-10 DIAGNOSIS — R310 Gross hematuria: Secondary | ICD-10-CM | POA: Insufficient documentation

## 2017-09-11 DIAGNOSIS — Z466 Encounter for fitting and adjustment of urinary device: Secondary | ICD-10-CM | POA: Diagnosis not present

## 2017-09-11 DIAGNOSIS — R339 Retention of urine, unspecified: Secondary | ICD-10-CM | POA: Diagnosis not present

## 2017-09-12 DIAGNOSIS — Z466 Encounter for fitting and adjustment of urinary device: Secondary | ICD-10-CM | POA: Diagnosis not present

## 2017-09-12 DIAGNOSIS — R339 Retention of urine, unspecified: Secondary | ICD-10-CM | POA: Diagnosis not present

## 2017-09-18 DIAGNOSIS — T83490S Other mechanical complication of penile (implanted) prosthesis, sequela: Secondary | ICD-10-CM | POA: Diagnosis not present

## 2017-09-18 DIAGNOSIS — R972 Elevated prostate specific antigen [PSA]: Secondary | ICD-10-CM | POA: Diagnosis not present

## 2017-09-18 DIAGNOSIS — N529 Male erectile dysfunction, unspecified: Secondary | ICD-10-CM | POA: Diagnosis not present

## 2017-09-18 DIAGNOSIS — R339 Retention of urine, unspecified: Secondary | ICD-10-CM | POA: Diagnosis not present

## 2017-10-09 DIAGNOSIS — R339 Retention of urine, unspecified: Secondary | ICD-10-CM | POA: Diagnosis not present

## 2017-10-09 DIAGNOSIS — R972 Elevated prostate specific antigen [PSA]: Secondary | ICD-10-CM | POA: Diagnosis not present

## 2017-10-09 DIAGNOSIS — N401 Enlarged prostate with lower urinary tract symptoms: Secondary | ICD-10-CM | POA: Diagnosis not present

## 2017-10-09 DIAGNOSIS — N529 Male erectile dysfunction, unspecified: Secondary | ICD-10-CM | POA: Diagnosis not present

## 2017-10-09 DIAGNOSIS — N138 Other obstructive and reflux uropathy: Secondary | ICD-10-CM | POA: Diagnosis not present

## 2017-10-09 DIAGNOSIS — E291 Testicular hypofunction: Secondary | ICD-10-CM | POA: Diagnosis not present

## 2017-10-23 DIAGNOSIS — Z960 Presence of urogenital implants: Secondary | ICD-10-CM | POA: Diagnosis not present

## 2017-10-23 DIAGNOSIS — N529 Male erectile dysfunction, unspecified: Secondary | ICD-10-CM | POA: Diagnosis not present

## 2017-10-23 DIAGNOSIS — T83490S Other mechanical complication of penile (implanted) prosthesis, sequela: Secondary | ICD-10-CM | POA: Diagnosis not present

## 2017-10-23 DIAGNOSIS — R972 Elevated prostate specific antigen [PSA]: Secondary | ICD-10-CM | POA: Diagnosis not present

## 2017-10-23 DIAGNOSIS — E291 Testicular hypofunction: Secondary | ICD-10-CM | POA: Diagnosis not present

## 2017-10-23 DIAGNOSIS — N401 Enlarged prostate with lower urinary tract symptoms: Secondary | ICD-10-CM | POA: Diagnosis not present

## 2017-10-23 DIAGNOSIS — R310 Gross hematuria: Secondary | ICD-10-CM | POA: Diagnosis not present

## 2017-10-24 DIAGNOSIS — N401 Enlarged prostate with lower urinary tract symptoms: Secondary | ICD-10-CM | POA: Diagnosis not present

## 2017-10-24 DIAGNOSIS — I440 Atrioventricular block, first degree: Secondary | ICD-10-CM | POA: Diagnosis not present

## 2017-10-24 DIAGNOSIS — R7303 Prediabetes: Secondary | ICD-10-CM | POA: Insufficient documentation

## 2017-10-24 DIAGNOSIS — N400 Enlarged prostate without lower urinary tract symptoms: Secondary | ICD-10-CM | POA: Diagnosis not present

## 2017-10-24 DIAGNOSIS — G4733 Obstructive sleep apnea (adult) (pediatric): Secondary | ICD-10-CM | POA: Diagnosis not present

## 2017-10-24 DIAGNOSIS — T84410A Breakdown (mechanical) of muscle and tendon graft, initial encounter: Secondary | ICD-10-CM | POA: Diagnosis not present

## 2017-10-24 DIAGNOSIS — N529 Male erectile dysfunction, unspecified: Secondary | ICD-10-CM | POA: Diagnosis not present

## 2017-10-24 DIAGNOSIS — Z79899 Other long term (current) drug therapy: Secondary | ICD-10-CM | POA: Diagnosis not present

## 2017-10-24 DIAGNOSIS — D649 Anemia, unspecified: Secondary | ICD-10-CM | POA: Diagnosis not present

## 2017-10-24 DIAGNOSIS — D631 Anemia in chronic kidney disease: Secondary | ICD-10-CM | POA: Diagnosis not present

## 2017-10-24 DIAGNOSIS — N183 Chronic kidney disease, stage 3 (moderate): Secondary | ICD-10-CM | POA: Diagnosis not present

## 2017-10-24 DIAGNOSIS — I129 Hypertensive chronic kidney disease with stage 1 through stage 4 chronic kidney disease, or unspecified chronic kidney disease: Secondary | ICD-10-CM | POA: Diagnosis not present

## 2017-10-24 DIAGNOSIS — Z01818 Encounter for other preprocedural examination: Secondary | ICD-10-CM | POA: Diagnosis not present

## 2017-10-24 DIAGNOSIS — E785 Hyperlipidemia, unspecified: Secondary | ICD-10-CM | POA: Diagnosis not present

## 2017-10-24 DIAGNOSIS — Z87891 Personal history of nicotine dependence: Secondary | ICD-10-CM | POA: Diagnosis not present

## 2017-10-24 DIAGNOSIS — T83490A Other mechanical complication of penile (implanted) prosthesis, initial encounter: Secondary | ICD-10-CM | POA: Diagnosis not present

## 2017-10-24 DIAGNOSIS — R350 Frequency of micturition: Secondary | ICD-10-CM | POA: Diagnosis not present

## 2017-10-24 DIAGNOSIS — K219 Gastro-esophageal reflux disease without esophagitis: Secondary | ICD-10-CM | POA: Diagnosis not present

## 2017-11-07 DIAGNOSIS — I129 Hypertensive chronic kidney disease with stage 1 through stage 4 chronic kidney disease, or unspecified chronic kidney disease: Secondary | ICD-10-CM | POA: Diagnosis not present

## 2017-11-07 DIAGNOSIS — T83410A Breakdown (mechanical) of penile (implanted) prosthesis, initial encounter: Secondary | ICD-10-CM | POA: Diagnosis not present

## 2017-11-07 DIAGNOSIS — K219 Gastro-esophageal reflux disease without esophagitis: Secondary | ICD-10-CM | POA: Diagnosis not present

## 2017-11-07 DIAGNOSIS — T84410A Breakdown (mechanical) of muscle and tendon graft, initial encounter: Secondary | ICD-10-CM | POA: Diagnosis not present

## 2017-11-07 DIAGNOSIS — D631 Anemia in chronic kidney disease: Secondary | ICD-10-CM | POA: Diagnosis not present

## 2017-11-07 DIAGNOSIS — N529 Male erectile dysfunction, unspecified: Secondary | ICD-10-CM | POA: Diagnosis not present

## 2017-11-07 DIAGNOSIS — G4733 Obstructive sleep apnea (adult) (pediatric): Secondary | ICD-10-CM | POA: Diagnosis not present

## 2017-11-08 DIAGNOSIS — G4733 Obstructive sleep apnea (adult) (pediatric): Secondary | ICD-10-CM | POA: Diagnosis not present

## 2017-11-08 DIAGNOSIS — I129 Hypertensive chronic kidney disease with stage 1 through stage 4 chronic kidney disease, or unspecified chronic kidney disease: Secondary | ICD-10-CM | POA: Diagnosis not present

## 2017-11-08 DIAGNOSIS — D631 Anemia in chronic kidney disease: Secondary | ICD-10-CM | POA: Diagnosis not present

## 2017-11-08 DIAGNOSIS — K219 Gastro-esophageal reflux disease without esophagitis: Secondary | ICD-10-CM | POA: Diagnosis not present

## 2017-11-08 DIAGNOSIS — T84410A Breakdown (mechanical) of muscle and tendon graft, initial encounter: Secondary | ICD-10-CM | POA: Diagnosis not present

## 2017-11-08 DIAGNOSIS — N529 Male erectile dysfunction, unspecified: Secondary | ICD-10-CM | POA: Diagnosis not present

## 2017-11-20 ENCOUNTER — Other Ambulatory Visit: Payer: Self-pay | Admitting: Internal Medicine

## 2017-12-19 DIAGNOSIS — H04123 Dry eye syndrome of bilateral lacrimal glands: Secondary | ICD-10-CM | POA: Diagnosis not present

## 2017-12-19 DIAGNOSIS — H25813 Combined forms of age-related cataract, bilateral: Secondary | ICD-10-CM | POA: Diagnosis not present

## 2017-12-19 DIAGNOSIS — H43813 Vitreous degeneration, bilateral: Secondary | ICD-10-CM | POA: Diagnosis not present

## 2017-12-19 DIAGNOSIS — H35411 Lattice degeneration of retina, right eye: Secondary | ICD-10-CM | POA: Diagnosis not present

## 2018-01-01 ENCOUNTER — Encounter: Payer: Self-pay | Admitting: Internal Medicine

## 2018-01-05 ENCOUNTER — Encounter: Payer: Self-pay | Admitting: Internal Medicine

## 2018-01-05 ENCOUNTER — Ambulatory Visit (INDEPENDENT_AMBULATORY_CARE_PROVIDER_SITE_OTHER): Payer: Medicare Other | Admitting: Internal Medicine

## 2018-01-05 ENCOUNTER — Telehealth: Payer: Self-pay | Admitting: Internal Medicine

## 2018-01-05 VITALS — BP 142/86 | HR 82 | Temp 97.9°F | Ht 72.0 in | Wt 243.0 lb

## 2018-01-05 DIAGNOSIS — Z23 Encounter for immunization: Secondary | ICD-10-CM

## 2018-01-05 DIAGNOSIS — R739 Hyperglycemia, unspecified: Secondary | ICD-10-CM | POA: Diagnosis not present

## 2018-01-05 DIAGNOSIS — B351 Tinea unguium: Secondary | ICD-10-CM | POA: Diagnosis not present

## 2018-01-05 DIAGNOSIS — E785 Hyperlipidemia, unspecified: Secondary | ICD-10-CM | POA: Diagnosis not present

## 2018-01-05 MED ORDER — TOLTERODINE TARTRATE ER 4 MG PO CP24: 4.0000 mg | ORAL_CAPSULE | Freq: Every day | ORAL | 3 refills | Status: DC

## 2018-01-05 MED ORDER — TESTOSTERONE 50 MG/5GM (1%) TD GEL: TRANSDERMAL | 3 refills | Status: DC

## 2018-01-05 MED ORDER — TEMAZEPAM 30 MG PO CAPS: ORAL_CAPSULE | ORAL | 3 refills | Status: DC

## 2018-01-05 MED ORDER — MELOXICAM 7.5 MG PO TABS: 7.5000 mg | ORAL_TABLET | Freq: Every day | ORAL | 3 refills | Status: DC

## 2018-01-05 MED ORDER — ROPINIROLE HCL 1 MG PO TABS: 1.0000 mg | ORAL_TABLET | Freq: Every day | ORAL | 3 refills | Status: DC

## 2018-01-05 MED ORDER — TERBINAFINE HCL 250 MG PO TABS: 250.0000 mg | ORAL_TABLET | Freq: Every day | ORAL | 0 refills | Status: DC

## 2018-01-05 MED ORDER — METHOCARBAMOL 500 MG PO TABS: 500.0000 mg | ORAL_TABLET | Freq: Four times a day (QID) | ORAL | 3 refills | Status: DC | PRN

## 2018-01-05 MED ORDER — PANTOPRAZOLE SODIUM 40 MG PO TBEC: 40.0000 mg | DELAYED_RELEASE_TABLET | Freq: Two times a day (BID) | ORAL | 3 refills | Status: DC

## 2018-01-05 MED ORDER — DICLOFENAC SODIUM 75 MG PO TBEC: 75.0000 mg | DELAYED_RELEASE_TABLET | Freq: Every day | ORAL | 1 refills | Status: DC | PRN

## 2018-01-05 MED ORDER — LOSARTAN POTASSIUM 50 MG PO TABS: 50.0000 mg | ORAL_TABLET | Freq: Every day | ORAL | 3 refills | Status: DC

## 2018-01-05 MED ORDER — TAMSULOSIN HCL 0.4 MG PO CAPS: 0.4000 mg | ORAL_CAPSULE | Freq: Every day | ORAL | 3 refills | Status: DC

## 2018-01-05 MED ORDER — SIMVASTATIN 40 MG PO TABS: 40.0000 mg | ORAL_TABLET | Freq: Every evening | ORAL | 3 refills | Status: DC
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
## 2018-01-05 NOTE — Patient Instructions (Addendum)
You had the Twinrix vaccination (Hep A and B in one shot)  Please return in 30 days for the Hep B (by itself), than return 5 mo after that (for total 6 mo from now) for TwinRx repeat shot  Please take all new medication as prescribed - the lamisil ([facility])  Please continue all other medications as before, and refills have been done if requested.  Please have the pharmacy call with any other refills you may need.  Please continue your efforts at being more active, low cholesterol diet, and weight control.  Please keep your appointments with your specialists as you may have planned  Please return in 6 weeks for the LAB work only  You will be contacted by phone if any changes need to be made immediately.  Otherwise, you will receive a letter about your results with an explanation, but please check with MyChart first.  Please remember to sign up for MyChart if you have not done so, as this will be important to you in the future with finding out test results, communicating by private email, and scheduling acute appointments online when needed.  Please return in 6 months, or sooner if needed

## 2018-01-05 NOTE — Assessment & Plan Note (Signed)
stable overall by history and exam, recent data reviewed with pt, and pt to continue medical treatment as before,  to f/u any worsening symptoms or concerns, for f/u lipid with labs

## 2018-01-05 NOTE — Assessment & Plan Note (Signed)
[place] for lamisil asd, for LFT at 6 wks

## 2018-01-05 NOTE — Telephone Encounter (Signed)
Done

## 2018-01-05 NOTE — Telephone Encounter (Signed)
Lamisil was sent to mail order.  [facility] is [facility].

## 2018-01-05 NOTE — Assessment & Plan Note (Signed)
Mild to mod, for a1c with labs, to f/u any worsening symptoms or concerns

## 2018-02-05 ENCOUNTER — Ambulatory Visit (INDEPENDENT_AMBULATORY_CARE_PROVIDER_SITE_OTHER): Payer: Medicare Other

## 2018-02-05 DIAGNOSIS — Z299 Encounter for prophylactic measures, unspecified: Secondary | ICD-10-CM

## 2018-02-05 DIAGNOSIS — Z23 Encounter for immunization: Secondary | ICD-10-CM | POA: Diagnosis not present

## 2018-02-16 ENCOUNTER — Telehealth: Payer: Self-pay | Admitting: Internal Medicine

## 2018-02-16 ENCOUNTER — Other Ambulatory Visit (INDEPENDENT_AMBULATORY_CARE_PROVIDER_SITE_OTHER): Payer: Medicare Other

## 2018-02-16 ENCOUNTER — Other Ambulatory Visit: Payer: Self-pay | Admitting: Internal Medicine

## 2018-02-16 DIAGNOSIS — E785 Hyperlipidemia, unspecified: Secondary | ICD-10-CM

## 2018-02-16 DIAGNOSIS — N390 Urinary tract infection, site not specified: Secondary | ICD-10-CM

## 2018-02-16 DIAGNOSIS — R739 Hyperglycemia, unspecified: Secondary | ICD-10-CM

## 2018-02-16 DIAGNOSIS — N179 Acute kidney failure, unspecified: Secondary | ICD-10-CM

## 2018-02-16 LAB — LIPID PANEL
CHOLESTEROL: 159 mg/dL (ref 0–200)
HDL: 53.9 mg/dL (ref >39.00)
LDL Cholesterol: 77 mg/dL (ref 0–99)
NONHDL: 105.37
Total CHOL/HDL Ratio: 3
Triglycerides: 141 mg/dL (ref 0.0–149.0)
VLDL: 28.2 mg/dL (ref 0.0–40.0)

## 2018-02-16 LAB — CBC WITH DIFFERENTIAL/PLATELET
BASOS PCT: 0.6 % (ref 0.0–3.0)
Basophils Absolute: 0 10*3/uL (ref 0.0–0.1)
EOS PCT: 3.8 % (ref 0.0–5.0)
Eosinophils Absolute: 0.2 10*3/uL (ref 0.0–0.7)
HEMATOCRIT: 36.7 % — AB (ref 39.0–52.0)
HEMOGLOBIN: 12.3 g/dL — AB (ref 13.0–17.0)
LYMPHS PCT: 29.4 % (ref 12.0–46.0)
Lymphs Abs: 1.9 10*3/uL (ref 0.7–4.0)
MCHC: 33.5 g/dL (ref 30.0–36.0)
MCV: 83.3 fl (ref 78.0–100.0)
MONO ABS: 0.7 10*3/uL (ref 0.1–1.0)
MONOS PCT: 10.5 % (ref 3.0–12.0)
Neutro Abs: 3.6 10*3/uL (ref 1.4–7.7)
Neutrophils Relative %: 55.7 % (ref 43.0–77.0)
Platelets: 186 10*3/uL (ref 150.0–400.0)
RBC: 4.41 Mil/uL (ref 4.22–5.81)
RDW: 15.8 % — ABNORMAL HIGH (ref 11.5–15.5)
WBC: 6.4 10*3/uL (ref 4.0–10.5)

## 2018-02-16 LAB — BASIC METABOLIC PANEL
BUN: 18 mg/dL (ref 6–23)
CALCIUM: 9 mg/dL (ref 8.4–10.5)
CO2: 26 mEq/L (ref 19–32)
Chloride: 105 mEq/L (ref 96–112)
Creatinine, Ser: 1.87 mg/dL — ABNORMAL HIGH (ref 0.40–1.50)
GFR: 37.47 mL/min — AB (ref >60.00)
GLUCOSE: 92 mg/dL (ref 70–99)
Potassium: 5.1 mEq/L (ref 3.5–5.1)
Sodium: 138 mEq/L (ref 135–145)

## 2018-02-16 LAB — URINALYSIS, ROUTINE W REFLEX MICROSCOPIC
BILIRUBIN URINE: NEGATIVE
Hgb urine dipstick: NEGATIVE
Ketones, ur: NEGATIVE
Nitrite: POSITIVE — AB
SPECIFIC GRAVITY, URINE: 1.01 (ref 1.000–1.030)
Total Protein, Urine: NEGATIVE
Urine Glucose: NEGATIVE
Urobilinogen, UA: 0.2 (ref 0.0–1.0)
pH: 6.5 (ref 5.0–8.0)

## 2018-02-16 LAB — HEMOGLOBIN A1C: HEMOGLOBIN A1C: 6.2 % (ref 4.6–6.5)

## 2018-02-16 LAB — HEPATIC FUNCTION PANEL
ALT: 31 U/L (ref 0–53)
AST: 20 U/L (ref 0–37)
Albumin: 4.1 g/dL (ref 3.5–5.2)
Alkaline Phosphatase: 117 U/L (ref 39–117)
BILIRUBIN TOTAL: 0.5 mg/dL (ref 0.2–1.2)
Bilirubin, Direct: 0.1 mg/dL (ref 0.0–0.3)
TOTAL PROTEIN: 7 g/dL (ref 6.0–8.3)

## 2018-02-16 LAB — TSH: TSH: 2.85 u[IU]/mL (ref 0.35–4.50)

## 2018-02-16 MED ORDER — CIPROFLOXACIN HCL 500 MG PO TABS: 500.0000 mg | ORAL_TABLET | Freq: Two times a day (BID) | ORAL | 0 refills | Status: AC

## 2018-02-16 NOTE — Telephone Encounter (Signed)
I would hold on this if this is for nail fungus due to other issues at this time (kidneys and possible UTI)

## 2018-02-16 NOTE — Telephone Encounter (Signed)
Routing to dr john, please advise, thanks 

## 2018-02-16 NOTE — Telephone Encounter (Signed)
Patient is requesting a refill on the following medication:  terbinafine (LAMISIL) 250 MG tablet   [id number] [place] [place] - [address]. AT [phone] (Phone) [phone] (Fax)

## 2018-02-18 ENCOUNTER — Encounter: Payer: Self-pay | Admitting: Internal Medicine

## 2018-02-19 ENCOUNTER — Ambulatory Visit (INDEPENDENT_AMBULATORY_CARE_PROVIDER_SITE_OTHER): Payer: Medicare Other

## 2018-02-19 ENCOUNTER — Encounter: Payer: Self-pay | Admitting: Internal Medicine

## 2018-02-19 DIAGNOSIS — C44712 Basal cell carcinoma of skin of right lower limb, including hip: Secondary | ICD-10-CM | POA: Diagnosis not present

## 2018-02-19 DIAGNOSIS — Z23 Encounter for immunization: Secondary | ICD-10-CM | POA: Diagnosis not present

## 2018-02-19 DIAGNOSIS — L905 Scar conditions and fibrosis of skin: Secondary | ICD-10-CM | POA: Diagnosis not present

## 2018-02-19 DIAGNOSIS — L570 Actinic keratosis: Secondary | ICD-10-CM | POA: Diagnosis not present

## 2018-02-19 DIAGNOSIS — L821 Other seborrheic keratosis: Secondary | ICD-10-CM | POA: Diagnosis not present

## 2018-02-19 DIAGNOSIS — C44612 Basal cell carcinoma of skin of right upper limb, including shoulder: Secondary | ICD-10-CM | POA: Diagnosis not present

## 2018-02-19 DIAGNOSIS — D1801 Hemangioma of skin and subcutaneous tissue: Secondary | ICD-10-CM | POA: Diagnosis not present

## 2018-02-19 DIAGNOSIS — Z85828 Personal history of other malignant neoplasm of skin: Secondary | ICD-10-CM | POA: Diagnosis not present

## 2018-03-02 ENCOUNTER — Other Ambulatory Visit (INDEPENDENT_AMBULATORY_CARE_PROVIDER_SITE_OTHER): Payer: Medicare Other

## 2018-03-02 DIAGNOSIS — N390 Urinary tract infection, site not specified: Secondary | ICD-10-CM

## 2018-03-02 DIAGNOSIS — N179 Acute kidney failure, unspecified: Secondary | ICD-10-CM | POA: Diagnosis not present

## 2018-03-02 LAB — URINALYSIS, ROUTINE W REFLEX MICROSCOPIC
Bilirubin Urine: NEGATIVE
HGB URINE DIPSTICK: NEGATIVE
Ketones, ur: NEGATIVE
LEUKOCYTES UA: NEGATIVE
NITRITE: NEGATIVE
RBC / HPF: NONE SEEN (ref 0–?)
Specific Gravity, Urine: <=1.005 — AB (ref 1.000–1.030)
Total Protein, Urine: NEGATIVE
Urine Glucose: NEGATIVE
Urobilinogen, UA: 0.2 (ref 0.0–1.0)
pH: 5.5 (ref 5.0–8.0)

## 2018-03-02 LAB — BASIC METABOLIC PANEL
BUN: 19 mg/dL (ref 6–23)
CHLORIDE: 108 meq/L (ref 96–112)
CO2: 26 meq/L (ref 19–32)
CREATININE: 1.68 mg/dL — AB (ref 0.40–1.50)
Calcium: 9 mg/dL (ref 8.4–10.5)
GFR: 42.39 mL/min — ABNORMAL LOW (ref >60.00)
Glucose, Bld: 102 mg/dL — ABNORMAL HIGH (ref 70–99)
POTASSIUM: 5 meq/L (ref 3.5–5.1)
SODIUM: 139 meq/L (ref 135–145)

## 2018-03-03 ENCOUNTER — Encounter: Payer: Self-pay | Admitting: Internal Medicine

## 2018-03-03 LAB — URINE CULTURE
MICRO NUMBER:: 91202152
RESULT: NO GROWTH
SPECIMEN QUALITY: ADEQUATE

## 2018-03-06 ENCOUNTER — Ambulatory Visit: Payer: Medicare Other
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
## 2018-03-11 ENCOUNTER — Ambulatory Visit (INDEPENDENT_AMBULATORY_CARE_PROVIDER_SITE_OTHER): Payer: Medicare Other | Admitting: *Deleted

## 2018-03-11 VITALS — BP 146/82 | HR 63 | Resp 18 | Ht 72.0 in | Wt 246.0 lb

## 2018-03-11 DIAGNOSIS — Z0000 Encounter for general adult medical examination without abnormal findings: Secondary | ICD-10-CM

## 2018-03-11 NOTE — Patient Instructions (Addendum)
Continue doing brain stimulating activities (puzzles, reading, adult coloring books, staying active) to keep memory sharp.   Continue to eat heart healthy diet (full of fruits, vegetables, whole grains, lean protein, water--limit salt, fat, and sugar intake) and increase physical activity as tolerated.   Alex Kemp , Thank you for taking time to come for your Medicare Wellness Visit. I appreciate your ongoing commitment to your health goals. Please review the following plan we discussed and let me know if I can assist you in the future.   These are the goals we discussed: Goals    . Increase my physical activity by walking, increase water intake, eat more fish and vegetables    . Patient Stated     Maintain current health status, stay as healthy and as independent as possible.       This is a list of the screening recommended for you and due dates:  Health Maintenance  Topic Date Due  . Tetanus Vaccine  01/06/2019*  . Colon Cancer Screening  09/29/2019  . Flu Shot  Completed  . Pneumonia vaccines  Completed  *Topic was postponed. The date shown is not the original due date.     Health Maintenance, Male A healthy lifestyle and preventive care is important for your health and wellness. Ask your health care provider about what schedule of regular examinations is right for you. What should I know about weight and diet? Eat a Healthy Diet  Eat plenty of vegetables, fruits, whole grains, low-fat dairy products, and lean protein.  Do not eat a lot of foods high in solid fats, added sugars, or salt.  Maintain a Healthy Weight Regular exercise can help you achieve or maintain a healthy weight. You should:  Do at least 150 minutes of exercise each week. The exercise should increase your heart rate and make you sweat (moderate-intensity exercise).  Do strength-training exercises at least twice a week.  Watch Your Levels of Cholesterol and Blood Lipids  Have your blood tested for  lipids and cholesterol every 5 years starting at 76 years of age. If you are at high risk for heart disease, you should start having your blood tested when you are 76 years old. You may need to have your cholesterol levels checked more often if: ? Your lipid or cholesterol levels are high. ? You are older than 76 years of age. ? You are at high risk for heart disease.  What should I know about cancer screening? Many types of cancers can be detected early and may often be prevented. Lung Cancer  You should be screened every year for lung cancer if: ? You are a current smoker who has smoked for at least 30 years. ? You are a former smoker who has quit within the past 15 years.  Talk to your health care provider about your screening options, when you should start screening, and how often you should be screened.  Colorectal Cancer  Routine colorectal cancer screening usually begins at 76 years of age and should be repeated every 5-10 years until you are 76 years old. You may need to be screened more often if early forms of precancerous polyps or small growths are found. Your health care provider may recommend screening at an earlier age if you have risk factors for colon cancer.  Your health care provider may recommend using home test kits to check for hidden blood in the stool.  A small camera at the end of a tube can be used  to examine your colon (sigmoidoscopy or colonoscopy). This checks for the earliest forms of colorectal cancer.  Prostate and Testicular Cancer  Depending on your age and overall health, your health care provider may do certain tests to screen for prostate and testicular cancer.  Talk to your health care provider about any symptoms or concerns you have about testicular or prostate cancer.  Skin Cancer  Check your skin from head to toe regularly.  Tell your health care provider about any new moles or changes in moles, especially if: ? There is a change in a mole's  size, shape, or color. ? You have a mole that is larger than a pencil eraser.  Always use sunscreen. Apply sunscreen liberally and repeat throughout the day.  Protect yourself by wearing long sleeves, pants, a wide-brimmed hat, and sunglasses when outside.  What should I know about heart disease, diabetes, and high blood pressure?  If you are 52-93 years of age, have your blood pressure checked every 3-5 years. If you are 18 years of age or older, have your blood pressure checked every year. You should have your blood pressure measured twice-once when you are at a hospital or clinic, and once when you are not at a hospital or clinic. Record the average of the two measurements. To check your blood pressure when you are not at a hospital or clinic, you can use: ? An automated blood pressure machine at a pharmacy. ? A home blood pressure monitor.  Talk to your health care provider about your target blood pressure.  If you are between 51-30 years old, ask your health care provider if you should take aspirin to prevent heart disease.  Have regular diabetes screenings by checking your fasting blood sugar level. ? If you are at a normal weight and have a low risk for diabetes, have this test once every three years after the age of 44. ? If you are overweight and have a high risk for diabetes, consider being tested at a younger age or more often.  A one-time screening for abdominal aortic aneurysm (AAA) by ultrasound is recommended for men aged 65-75 years who are current or former smokers. What should I know about preventing infection? Hepatitis B If you have a higher risk for hepatitis B, you should be screened for this virus. Talk with your health care provider to find out if you are at risk for hepatitis B infection. Hepatitis C Blood testing is recommended for:  Everyone born from [birth year] through 1964.  Anyone with known risk factors for hepatitis C.  Sexually Transmitted Diseases  (STDs)  You should be screened each year for STDs including gonorrhea and chlamydia if: ? You are sexually active and are younger than 76 years of age. ? You are older than 76 years of age and your health care provider tells you that you are at risk for this type of infection. ? Your sexual activity has changed since you were last screened and you are at an increased risk for chlamydia or gonorrhea. Ask your health care provider if you are at risk.  Talk with your health care provider about whether you are at high risk of being infected with HIV. Your health care provider may recommend a prescription medicine to help prevent HIV infection.  What else can I do?  Schedule regular health, dental, and eye exams.  Stay current with your vaccines (immunizations).  Do not use any tobacco products, such as cigarettes, chewing tobacco, and e-cigarettes. If you  need help quitting, ask your health care provider.  Limit alcohol intake to no more than 2 drinks per day. One drink equals 12 ounces of beer, 5 ounces of Mai Longnecker, or 1 ounces of hard liquor.  Do not use street drugs.  Do not share needles.  Ask your health care provider for help if you need support or information about quitting drugs.  Tell your health care provider if you often feel depressed.  Tell your health care provider if you have ever been abused or do not feel safe at home. This information is not intended to replace advice given to you by your health care provider. Make sure you discuss any questions you have with your health care provider. Document Released: 11/09/2007 Document Revised: 01/10/2016 Document Reviewed: 02/14/2015 Elsevier Interactive Patient Education  [employer].

## 2018-03-17 ENCOUNTER — Telehealth: Payer: Self-pay | Admitting: Internal Medicine

## 2018-03-17 NOTE — Telephone Encounter (Signed)
Copied from [place] [id number]. Topic: Quick Communication - Rx Refill/Question >> Mar 17, 2018  2:12 PM Bea Graff, NT wrote: Medication: terbinafine (LAMISIL) 250 MG tablet   [facility] wants to know if they should refill this for this pt.   Has the patient contacted their pharmacy? Yes.   (Agent: If no, request that the patient contact the pharmacy for the refill.) (Agent: If yes, when and what did the pharmacy advise?)  Preferred Pharmacy (with phone number or street name): [facility], [place] [facility] [phone] (Phone) [phone] (Fax)   Ref#: [id number]  Agent: Please be advised that RX refills may take up to 3 business days. We ask that you follow-up with your pharmacy.

## 2018-03-19 NOTE — Telephone Encounter (Signed)
On further review, we should hold on taking this med, as I believe it was for toenail fungus and tx is normally limited to 12 weeks

## 2018-03-20 NOTE — Telephone Encounter (Signed)
Called [facility] spoke w/[place]. Gave MD response. He informed that the med was actually shipped out to pt on 03/16/18, but will notate on pt record.Marland KitchenJohny Chess

## 2018-03-28 ENCOUNTER — Encounter: Payer: Self-pay | Admitting: Internal Medicine

## 2018-03-30 MED ORDER — TERBINAFINE HCL 250 MG PO TABS: 250.0000 mg | ORAL_TABLET | Freq: Every day | ORAL | 0 refills | Status: DC

## 2018-04-19 ENCOUNTER — Encounter: Payer: Self-pay | Admitting: Internal Medicine

## 2018-04-20 ENCOUNTER — Telehealth: Payer: Self-pay

## 2018-04-20 ENCOUNTER — Ambulatory Visit (INDEPENDENT_AMBULATORY_CARE_PROVIDER_SITE_OTHER): Payer: Medicare Other | Admitting: Internal Medicine

## 2018-04-20 ENCOUNTER — Encounter: Payer: Self-pay | Admitting: Internal Medicine

## 2018-04-20 DIAGNOSIS — R739 Hyperglycemia, unspecified: Secondary | ICD-10-CM

## 2018-04-20 DIAGNOSIS — I10 Essential (primary) hypertension: Secondary | ICD-10-CM

## 2018-04-20 DIAGNOSIS — J069 Acute upper respiratory infection, unspecified: Secondary | ICD-10-CM

## 2018-04-20 MED ORDER — AZITHROMYCIN 250 MG PO TABS: ORAL_TABLET | ORAL | 0 refills | Status: DC

## 2018-04-20 MED ORDER — DIPHENOXYLATE-ATROPINE 2.5-0.025 MG PO TABS: ORAL_TABLET | ORAL | 0 refills | Status: DC
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
## 2018-04-20 NOTE — Assessment & Plan Note (Signed)
stable overall by history and exam, recent data reviewed with pt, and pt to continue medical treatment as before,  to f/u any worsening symptoms or concerns  

## 2018-04-20 NOTE — Telephone Encounter (Signed)
Key: [id number]

## 2018-04-20 NOTE — Assessment & Plan Note (Signed)
Mild to mod, for antibx course,  to f/u any worsening symptoms or concerns 

## 2018-04-20 NOTE — Telephone Encounter (Signed)
Approved  03/21/2018 through 04/20/2019.

## 2018-04-20 NOTE — Patient Instructions (Signed)
Please take all new medication as prescribed - the antibiotic, and diarrhea medication as needed  Please continue all other medications as before, and refills have been done if requested.  Please have the pharmacy call with any other refills you may need.  Please keep your appointments with your specialists as you may have planned

## 2018-06-02 ENCOUNTER — Other Ambulatory Visit: Payer: Self-pay | Admitting: Internal Medicine

## 2018-06-03 MED ORDER — TERBINAFINE HCL 250 MG PO TABS: 250.0000 mg | ORAL_TABLET | Freq: Every day | ORAL | 0 refills | Status: DC

## 2018-06-23 ENCOUNTER — Encounter: Payer: Self-pay | Admitting: Internal Medicine

## 2018-06-24 ENCOUNTER — Telehealth: Payer: Self-pay

## 2018-06-24 NOTE — Telephone Encounter (Signed)
PA form from [place] [employer] was received, filled out and faxed back for approval or denial for testosterone.

## 2018-06-29 ENCOUNTER — Telehealth: Payer: Self-pay

## 2018-06-29 NOTE — Telephone Encounter (Signed)
Per [employer] FEP medication approved through 06/25/2019  Determination and paper PA sent to scan.

## 2018-07-07 ENCOUNTER — Other Ambulatory Visit: Payer: Self-pay | Admitting: Internal Medicine

## 2018-07-07 NOTE — Telephone Encounter (Signed)
Done erx 

## 2018-07-09 ENCOUNTER — Ambulatory Visit (INDEPENDENT_AMBULATORY_CARE_PROVIDER_SITE_OTHER): Payer: Medicare Other | Admitting: Internal Medicine

## 2018-07-09 ENCOUNTER — Other Ambulatory Visit (INDEPENDENT_AMBULATORY_CARE_PROVIDER_SITE_OTHER): Payer: Medicare Other

## 2018-07-09 ENCOUNTER — Encounter: Payer: Self-pay | Admitting: Internal Medicine

## 2018-07-09 ENCOUNTER — Ambulatory Visit: Payer: Medicare Other

## 2018-07-09 VITALS — BP 122/74 | HR 72 | Temp 98.0°F | Wt 249.4 lb

## 2018-07-09 DIAGNOSIS — Z23 Encounter for immunization: Secondary | ICD-10-CM

## 2018-07-09 DIAGNOSIS — E291 Testicular hypofunction: Secondary | ICD-10-CM | POA: Diagnosis not present

## 2018-07-09 DIAGNOSIS — N183 Chronic kidney disease, stage 3 unspecified: Secondary | ICD-10-CM

## 2018-07-09 DIAGNOSIS — I10 Essential (primary) hypertension: Secondary | ICD-10-CM | POA: Diagnosis not present

## 2018-07-09 DIAGNOSIS — E785 Hyperlipidemia, unspecified: Secondary | ICD-10-CM

## 2018-07-09 DIAGNOSIS — N320 Bladder-neck obstruction: Secondary | ICD-10-CM

## 2018-07-09 DIAGNOSIS — R739 Hyperglycemia, unspecified: Secondary | ICD-10-CM | POA: Diagnosis not present

## 2018-07-09 HISTORY — DX: Chronic kidney disease, stage 3 unspecified: N18.30

## 2018-07-09 LAB — CBC WITH DIFFERENTIAL/PLATELET
Basophils Absolute: 0.1 10*3/uL (ref 0.0–0.1)
Basophils Relative: 0.8 % (ref 0.0–3.0)
Eosinophils Absolute: 0.3 10*3/uL (ref 0.0–0.7)
Eosinophils Relative: 3.8 % (ref 0.0–5.0)
HCT: 39.9 % (ref 39.0–52.0)
Hemoglobin: 13 g/dL (ref 13.0–17.0)
Lymphocytes Relative: 29 % (ref 12.0–46.0)
Lymphs Abs: 2.3 10*3/uL (ref 0.7–4.0)
MCHC: 32.7 g/dL (ref 30.0–36.0)
MCV: 80.7 fl (ref 78.0–100.0)
Monocytes Absolute: 0.8 10*3/uL (ref 0.1–1.0)
Monocytes Relative: 10.2 % (ref 3.0–12.0)
Neutro Abs: 4.4 10*3/uL (ref 1.4–7.7)
Neutrophils Relative %: 56.2 % (ref 43.0–77.0)
Platelets: 186 10*3/uL (ref 150.0–400.0)
RBC: 4.94 Mil/uL (ref 4.22–5.81)
RDW: 15.7 % — ABNORMAL HIGH (ref 11.5–15.5)
WBC: 7.8 10*3/uL (ref 4.0–10.5)

## 2018-07-09 LAB — LIPID PANEL
Cholesterol: 180 mg/dL (ref 0–200)
HDL: 59.6 mg/dL (ref >39.00)
LDL Cholesterol: 91 mg/dL (ref 0–99)
NonHDL: 120.46
Total CHOL/HDL Ratio: 3
Triglycerides: 149 mg/dL (ref 0.0–149.0)
VLDL: 29.8 mg/dL (ref 0.0–40.0)

## 2018-07-09 LAB — BASIC METABOLIC PANEL
BUN: 21 mg/dL (ref 6–23)
CALCIUM: 9 mg/dL (ref 8.4–10.5)
CO2: 26 mEq/L (ref 19–32)
Chloride: 105 mEq/L (ref 96–112)
Creatinine, Ser: 1.74 mg/dL — ABNORMAL HIGH (ref 0.40–1.50)
GFR: 38.27 mL/min — AB (ref >60.00)
Glucose, Bld: 94 mg/dL (ref 70–99)
Potassium: 4.5 mEq/L (ref 3.5–5.1)
Sodium: 140 mEq/L (ref 135–145)

## 2018-07-09 LAB — URINALYSIS, ROUTINE W REFLEX MICROSCOPIC
Bilirubin Urine: NEGATIVE
Hgb urine dipstick: NEGATIVE
Ketones, ur: NEGATIVE
Leukocytes,Ua: NEGATIVE
Nitrite: NEGATIVE
RBC / HPF: NONE SEEN (ref 0–?)
SPECIFIC GRAVITY, URINE: 1.01 (ref 1.000–1.030)
Total Protein, Urine: NEGATIVE
URINE GLUCOSE: NEGATIVE
UROBILINOGEN UA: 0.2 (ref 0.0–1.0)
WBC, UA: NONE SEEN (ref 0–?)
pH: 6 (ref 5.0–8.0)

## 2018-07-09 LAB — HEPATIC FUNCTION PANEL
ALT: 23 U/L (ref 0–53)
AST: 19 U/L (ref 0–37)
Albumin: 4.3 g/dL (ref 3.5–5.2)
Alkaline Phosphatase: 120 U/L — ABNORMAL HIGH (ref 39–117)
BILIRUBIN TOTAL: 0.4 mg/dL (ref 0.2–1.2)
Bilirubin, Direct: 0.1 mg/dL (ref 0.0–0.3)
Total Protein: 6.8 g/dL (ref 6.0–8.3)

## 2018-07-09 LAB — TESTOSTERONE: Testosterone: 744.67 ng/dL (ref 300.00–890.00)

## 2018-07-09 LAB — HEMOGLOBIN A1C: Hgb A1c MFr Bld: 5.9 % (ref 4.6–6.5)

## 2018-07-09 LAB — TSH: TSH: 3.64 u[IU]/mL (ref 0.35–4.50)

## 2018-07-09 LAB — PSA: PSA: 3.6 ng/mL (ref 0.10–4.00)

## 2018-07-09 NOTE — Patient Instructions (Addendum)
You had the 3rd Twinrix shot today  Please continue all other medications as before, and refills have been done if requested.  Please have the pharmacy call with any other refills you may need.  Please continue your efforts at being more active, low cholesterol diet, and weight control.  Please keep your appointments with your specialists as you may have planned  Please go to the LAB in the Basement (turn left off the elevator) for the tests to be done today  You will be contacted by phone if any changes need to be made immediately.  Otherwise, you will receive a letter about your results with an explanation, but please check with MyChart first.  Please remember to sign up for MyChart if you have not done so, as this will be important to you in the future with finding out test results, communicating by private email, and scheduling acute appointments online when needed.  Please return in 6 months, or sooner if needed

## 2018-07-09 NOTE — Assessment & Plan Note (Signed)
For testosterone level,  to f/u any worsening symptoms or concerns 
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
## 2018-07-09 NOTE — Assessment & Plan Note (Signed)
stable overall by history and exam, recent data reviewed with pt, and pt to continue medical treatment as before,  to f/u any worsening symptoms or concerns  

## 2018-07-09 NOTE — Assessment & Plan Note (Signed)
Recent recogniton, d/w pt, overall stable, declines nephrology referral

## 2018-08-26 ENCOUNTER — Other Ambulatory Visit: Payer: Self-pay | Admitting: Internal Medicine

## 2018-08-26 MED ORDER — TERBINAFINE HCL 250 MG PO TABS: 250.0000 mg | ORAL_TABLET | Freq: Every day | ORAL | 0 refills | Status: DC

## 2018-09-28 ENCOUNTER — Other Ambulatory Visit: Payer: Self-pay | Admitting: Internal Medicine

## 2018-10-07 ENCOUNTER — Encounter: Payer: Self-pay | Admitting: Internal Medicine

## 2018-10-07 NOTE — Telephone Encounter (Signed)
Staff to contact pt - for ROV for left back (skin) pain

## 2018-10-08 ENCOUNTER — Encounter: Payer: Self-pay | Admitting: Internal Medicine

## 2018-10-08 ENCOUNTER — Ambulatory Visit (INDEPENDENT_AMBULATORY_CARE_PROVIDER_SITE_OTHER): Payer: Medicare Other | Admitting: Internal Medicine

## 2018-10-08 ENCOUNTER — Other Ambulatory Visit: Payer: Self-pay

## 2018-10-08 VITALS — BP 128/76 | HR 91 | Temp 97.7°F | Ht 72.0 in | Wt 243.0 lb

## 2018-10-08 DIAGNOSIS — I10 Essential (primary) hypertension: Secondary | ICD-10-CM

## 2018-10-08 DIAGNOSIS — R739 Hyperglycemia, unspecified: Secondary | ICD-10-CM

## 2018-10-08 DIAGNOSIS — B029 Zoster without complications: Secondary | ICD-10-CM

## 2018-10-08 MED ORDER — ZOSTER VAC RECOMB ADJUVANTED 50 MCG/0.5ML IM SUSR: 0.5000 mL | Freq: Once | INTRAMUSCULAR | 1 refills | Status: AC

## 2018-10-08 MED ORDER — HYDROCODONE-ACETAMINOPHEN 5-325 MG PO TABS: 1.0000 | ORAL_TABLET | Freq: Four times a day (QID) | ORAL | 0 refills | Status: DC | PRN

## 2018-10-08 MED ORDER — VALACYCLOVIR HCL 1 G PO TABS: 1000.0000 mg | ORAL_TABLET | Freq: Three times a day (TID) | ORAL | 0 refills | Status: DC

## 2018-10-08 MED ORDER — GABAPENTIN 100 MG PO CAPS: 100.0000 mg | ORAL_CAPSULE | Freq: Three times a day (TID) | ORAL | 3 refills | Status: DC
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
## 2018-10-08 NOTE — Patient Instructions (Signed)
Please take all new medication as prescribed - the valtrex antibiotic, the hydrocodone (vicodin) as needed for pain, and the gabapentin three times daily for the nerve pain (causing the skin to be sensitive)  Please call if you feel you need further hydrocodone after the first week  Please call for a higher dose of the gabapentin after 5-7 days if you feel you need this  Your shingles shot prescription was sent to the pharmacy  Please continue all other medications as before, and refills have been done if requested.  Please have the pharmacy call with any other refills you may need.  Please keep your appointments with your specialists as you may have planned

## 2018-10-10 ENCOUNTER — Encounter: Payer: Self-pay | Admitting: Internal Medicine

## 2018-10-10 NOTE — Assessment & Plan Note (Signed)
stable overall by history and exam, recent data reviewed with pt, and pt to continue medical treatment as before,  to f/u any worsening symptoms or concerns le  

## 2018-10-10 NOTE — Assessment & Plan Note (Signed)
stable overall by history and exam, recent data reviewed with pt, and pt to continue medical treatment as before,  to f/u any worsening symptoms or concerns  

## 2018-10-10 NOTE — Assessment & Plan Note (Signed)
mod, for valtrex asd, vicodin prn, gabapentin asd, for shingles shot,  to f/u any worsening symptoms or concerns

## 2018-10-17 IMAGING — CR DG LUMBAR SPINE COMPLETE 4+V
2 series · 2 of 2 positions shown · non-contrast
Comparison: Two views lumbar spine 03/04/2017. MRI lumbar spine
01/23/2017.

CLINICAL DATA: Intraoperative localization films in patient for
L3-4 and L4-5 decompression.

EXAM:
LUMBAR SPINE - COMPLETE 4+ VIEW

[lateral (1 of 2)]
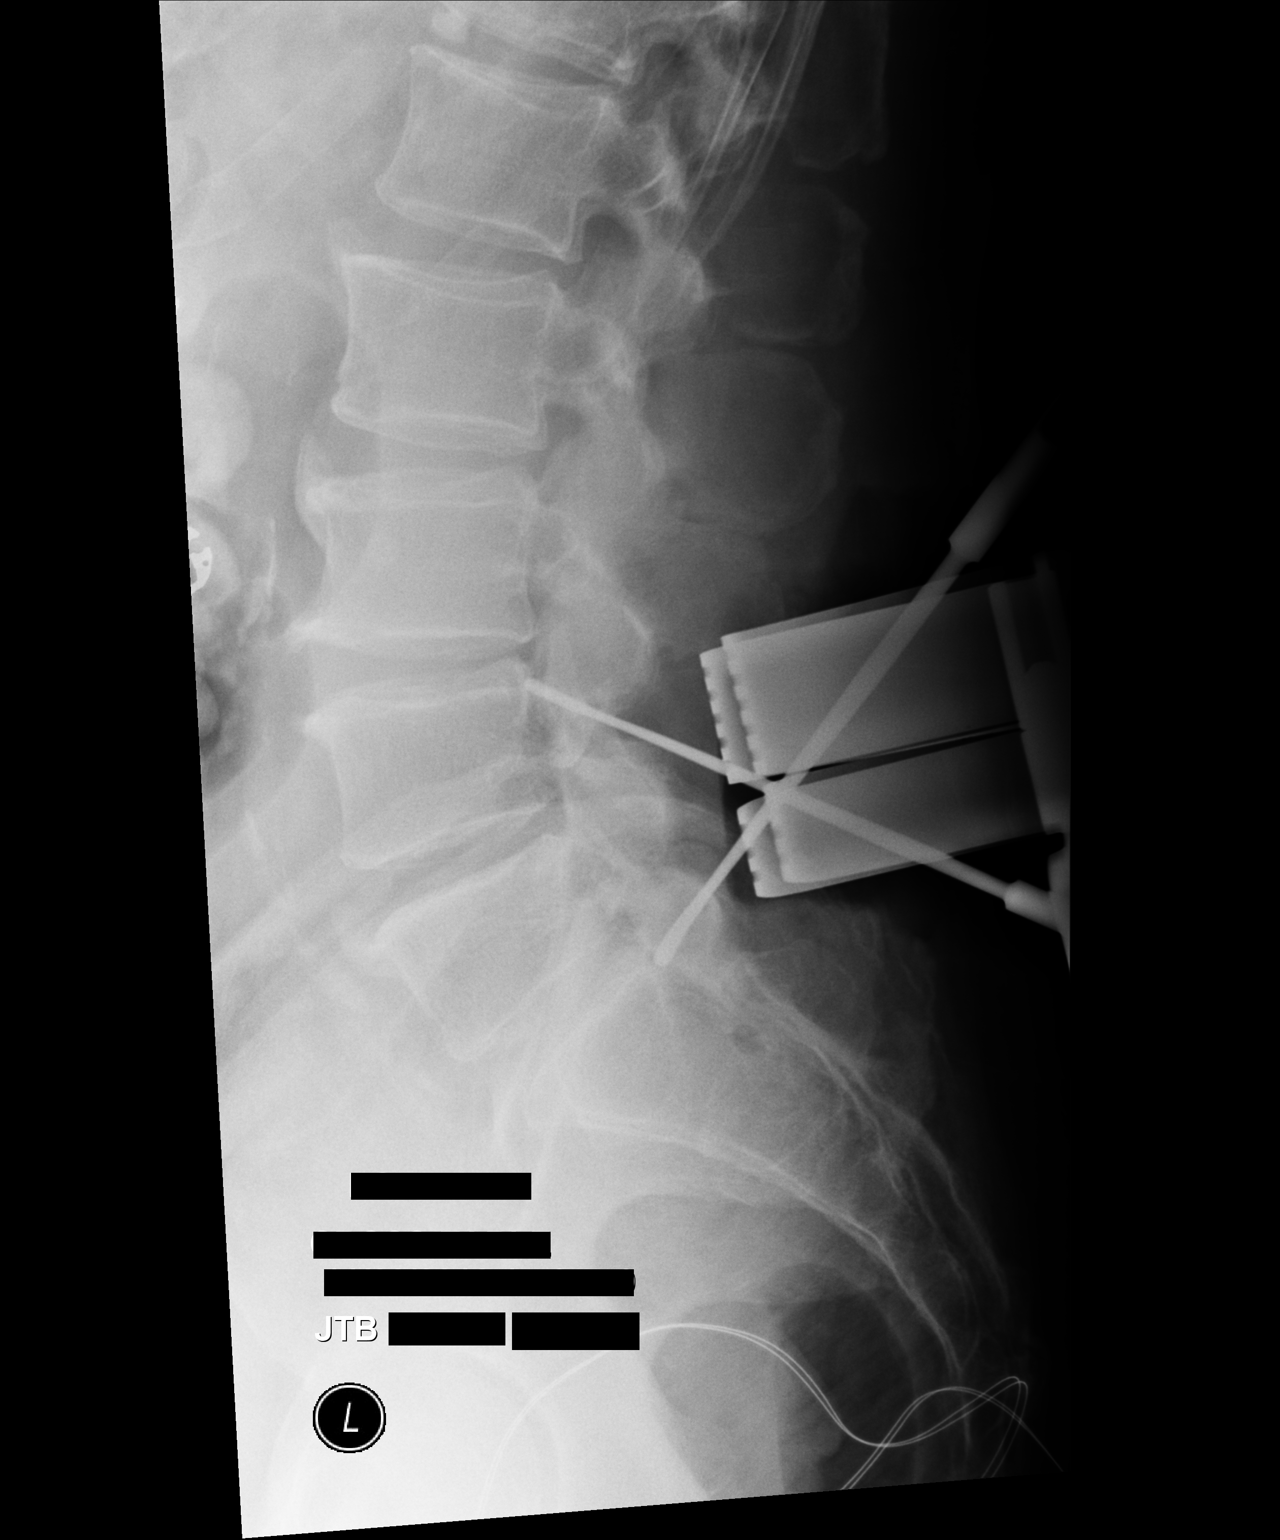

[lateral (2 of 2)]
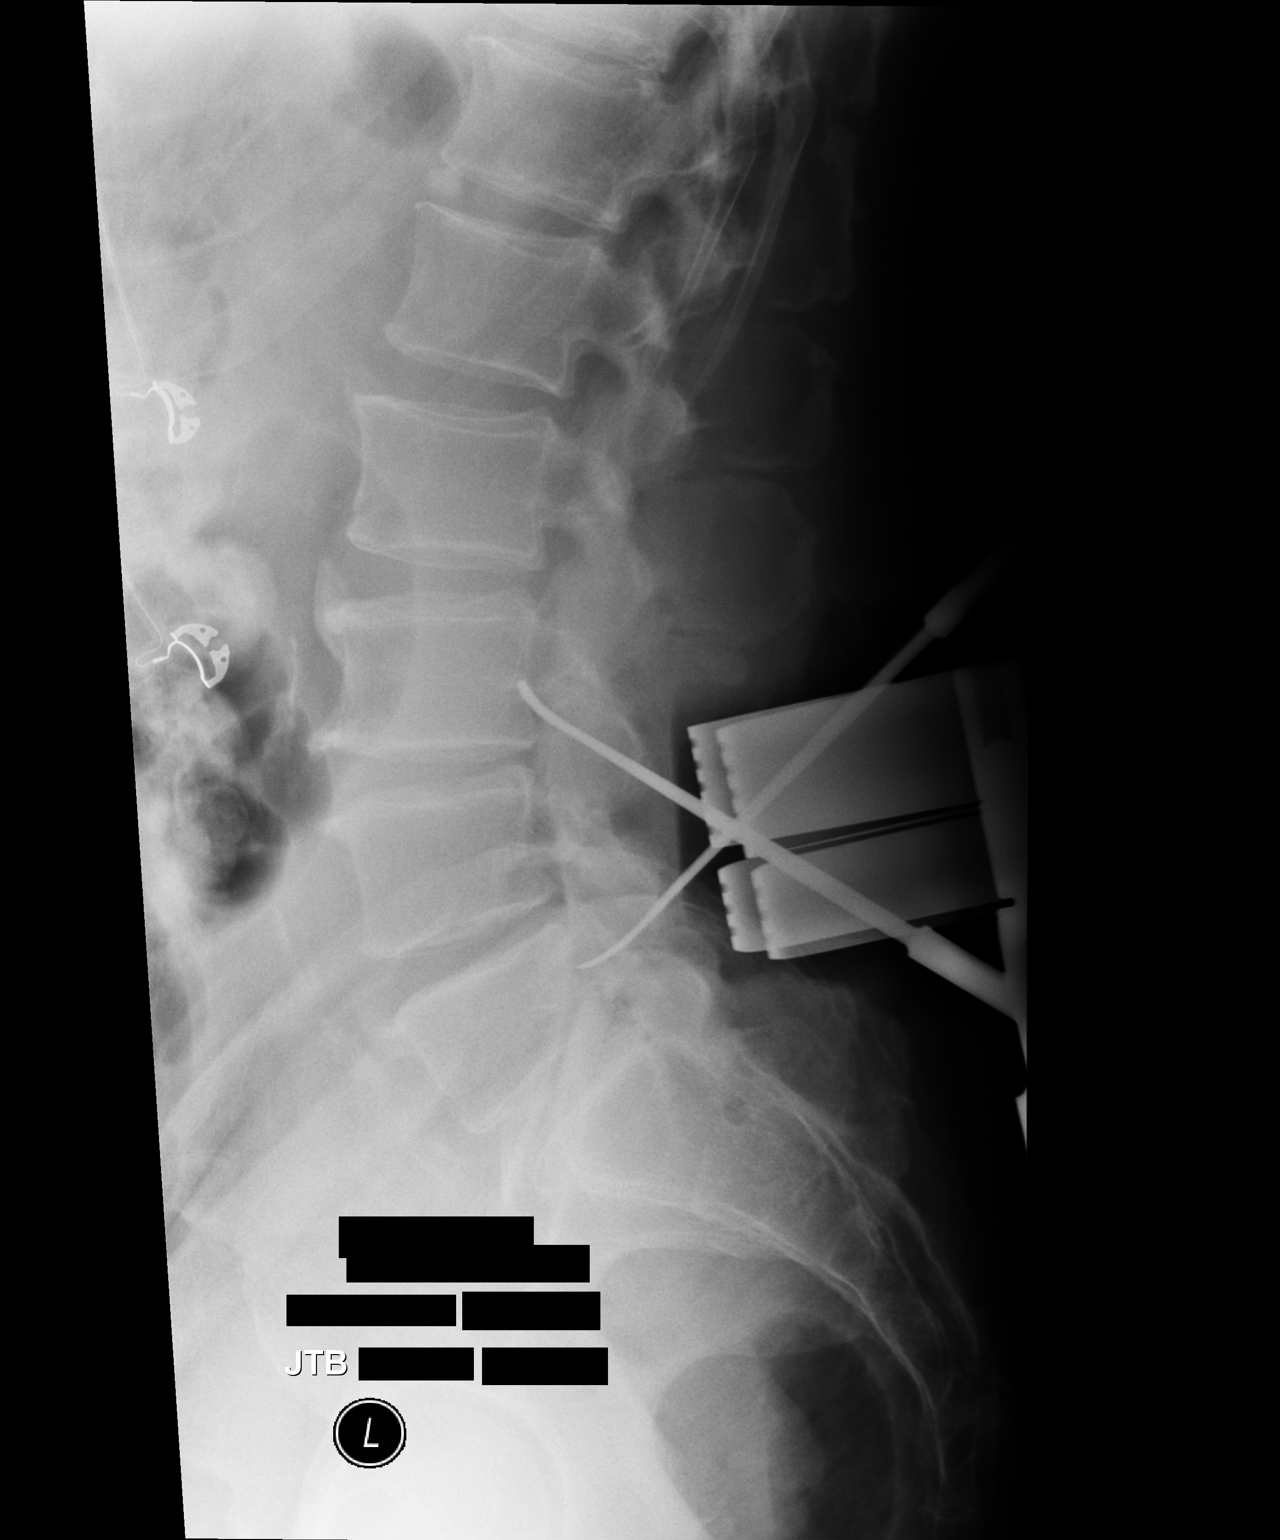

[2 of 2 positions shown; findings below may reference images not displayed]

FINDINGS: Four intraoperative views of the lumbar spine in the lateral
projection are provided. On the first image, probes are at the level
of the L3 pedicles and just below the level of the L4 pedicles. On
the second image, probes are at the level of the L3 pedicles and
directed toward the L4-5 disc interspace. On the third image, clamps
are on the L4 and L5 spinous processes. Probes are seen just below
the level of the L3-4 disc interspace at L5-S1 disc interspace. On
the last image, probes are seen just below the level of the L3
pedicles and at the level of the L5 pedicles.
IMPRESSION: Intraoperative localization as described above.

## 2018-10-29 DIAGNOSIS — H35411 Lattice degeneration of retina, right eye: Secondary | ICD-10-CM | POA: Diagnosis not present

## 2018-10-29 DIAGNOSIS — H25813 Combined forms of age-related cataract, bilateral: Secondary | ICD-10-CM | POA: Diagnosis not present

## 2018-10-29 DIAGNOSIS — H52203 Unspecified astigmatism, bilateral: Secondary | ICD-10-CM | POA: Diagnosis not present

## 2018-10-29 DIAGNOSIS — H43813 Vitreous degeneration, bilateral: Secondary | ICD-10-CM | POA: Diagnosis not present

## 2018-10-30 ENCOUNTER — Other Ambulatory Visit: Payer: Self-pay | Admitting: Internal Medicine

## 2018-10-30 NOTE — Telephone Encounter (Signed)
Done erx 

## 2018-11-16 ENCOUNTER — Encounter: Payer: Self-pay | Admitting: Internal Medicine

## 2018-11-17 MED ORDER — TERBINAFINE HCL 250 MG PO TABS: 250.0000 mg | ORAL_TABLET | Freq: Every day | ORAL | 0 refills | Status: DC

## 2018-11-21 IMAGING — DX DG CHEST 2V
2 series · 2 of 2 positions shown · non-contrast
Comparison: 05/22/2016

CLINICAL DATA: Shortness of breath and weakness for 1 month

EXAM:
CHEST  2 VIEW

[chest pa]
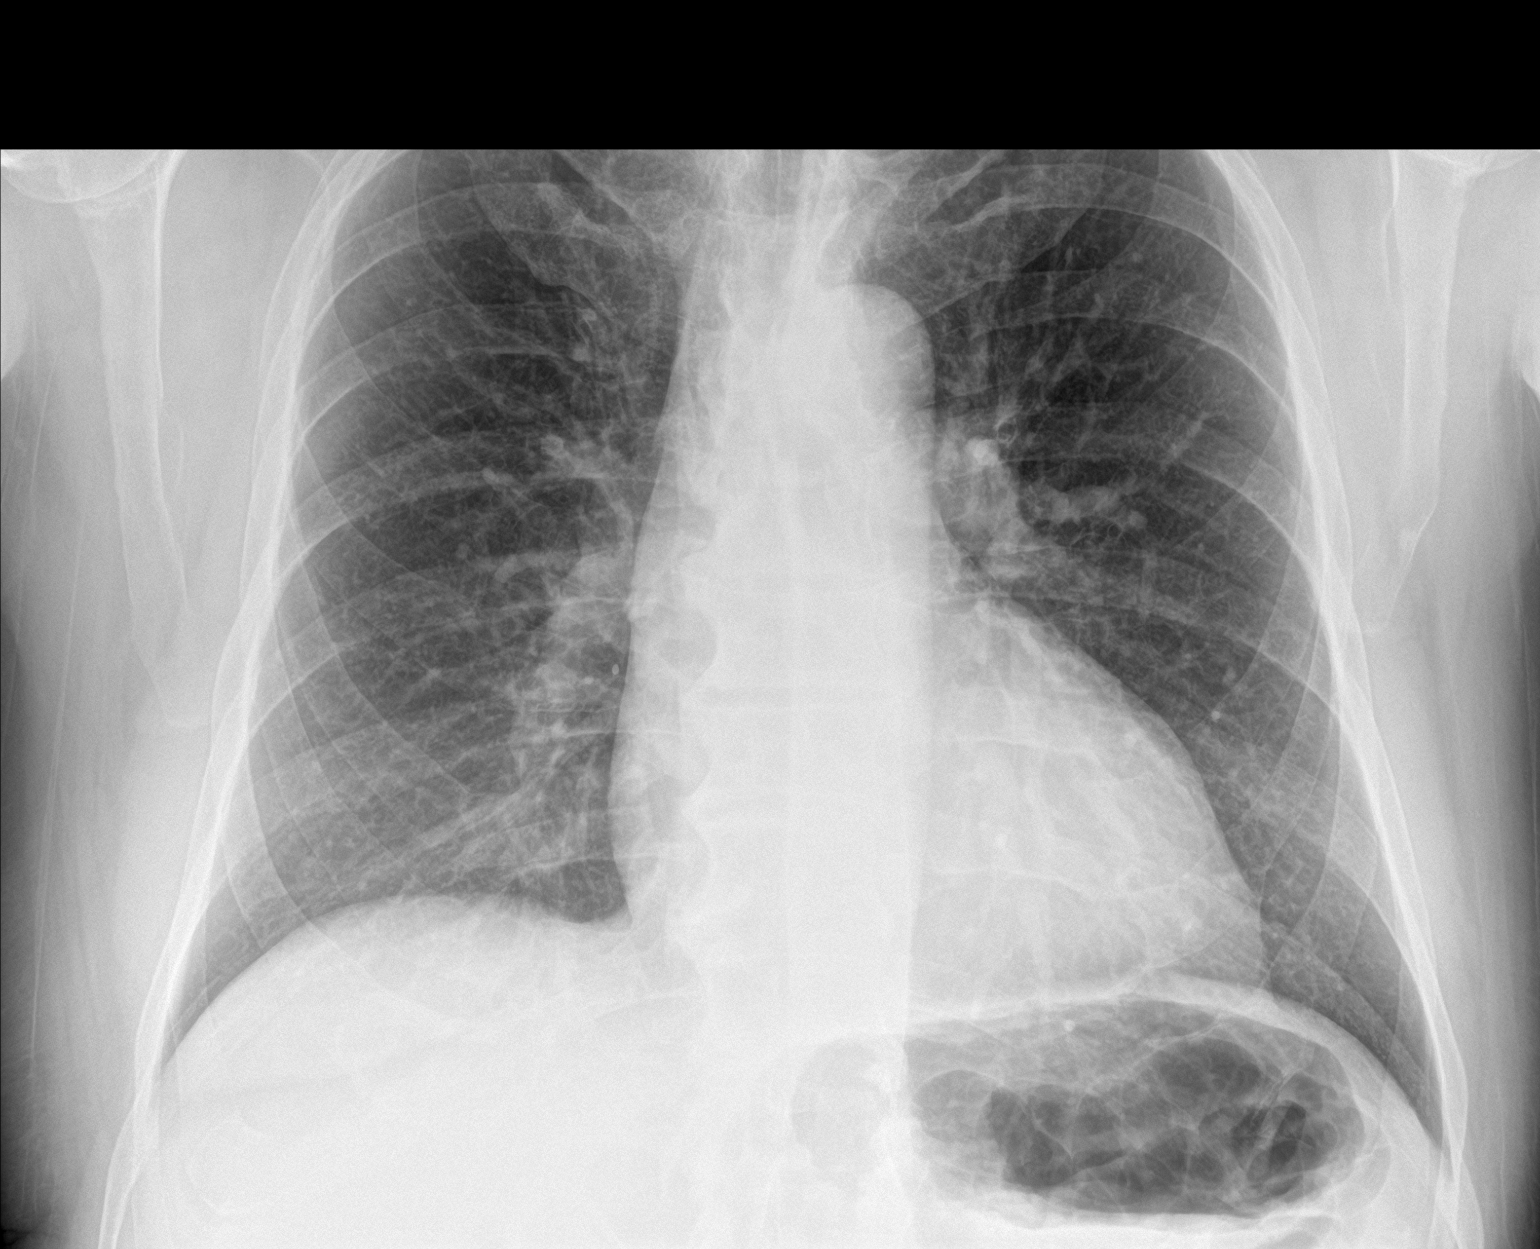

[chest lat]
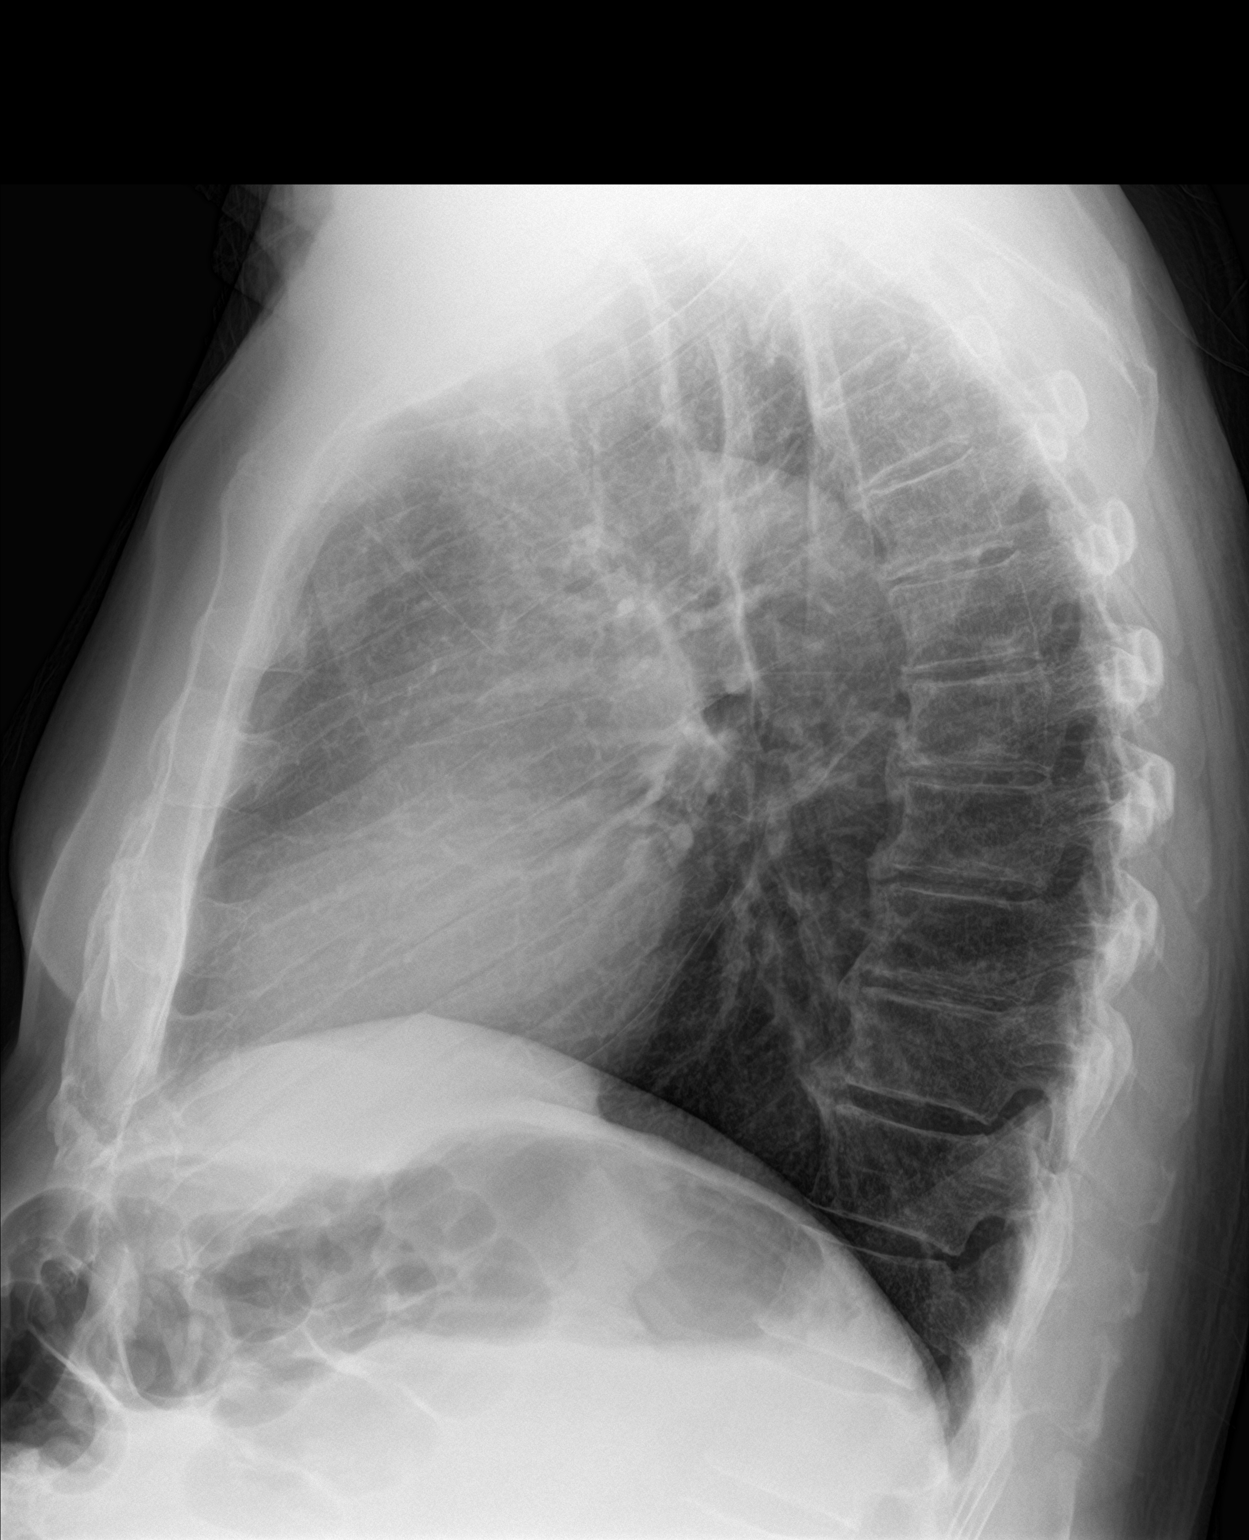

[2 of 2 positions shown; findings below may reference images not displayed]

FINDINGS: The heart size and mediastinal contours are within normal limits.
Both lungs are clear. The visualized skeletal structures show
degenerative changes of thoracic spine.
IMPRESSION: No active cardiopulmonary disease.

## 2018-11-26 ENCOUNTER — Other Ambulatory Visit: Payer: Self-pay | Admitting: Internal Medicine

## 2018-11-30 DIAGNOSIS — Z20828 Contact with and (suspected) exposure to other viral communicable diseases: Secondary | ICD-10-CM | POA: Diagnosis not present

## 2018-12-17 DIAGNOSIS — N401 Enlarged prostate with lower urinary tract symptoms: Secondary | ICD-10-CM | POA: Diagnosis not present

## 2018-12-17 DIAGNOSIS — E291 Testicular hypofunction: Secondary | ICD-10-CM | POA: Diagnosis not present

## 2018-12-17 DIAGNOSIS — N138 Other obstructive and reflux uropathy: Secondary | ICD-10-CM | POA: Diagnosis not present

## 2018-12-17 DIAGNOSIS — N529 Male erectile dysfunction, unspecified: Secondary | ICD-10-CM | POA: Diagnosis not present

## 2018-12-17 DIAGNOSIS — R350 Frequency of micturition: Secondary | ICD-10-CM | POA: Diagnosis not present

## 2019-01-08 ENCOUNTER — Other Ambulatory Visit: Payer: Self-pay

## 2019-01-08 ENCOUNTER — Other Ambulatory Visit: Payer: Self-pay | Admitting: Internal Medicine

## 2019-01-08 ENCOUNTER — Encounter: Payer: Self-pay | Admitting: Internal Medicine

## 2019-01-08 ENCOUNTER — Ambulatory Visit (INDEPENDENT_AMBULATORY_CARE_PROVIDER_SITE_OTHER): Payer: Medicare Other | Admitting: Internal Medicine

## 2019-01-08 ENCOUNTER — Telehealth: Payer: Self-pay

## 2019-01-08 ENCOUNTER — Other Ambulatory Visit (INDEPENDENT_AMBULATORY_CARE_PROVIDER_SITE_OTHER): Payer: Medicare Other

## 2019-01-08 VITALS — BP 136/76 | HR 92 | Temp 97.8°F | Ht 72.0 in | Wt 245.0 lb

## 2019-01-08 DIAGNOSIS — R739 Hyperglycemia, unspecified: Secondary | ICD-10-CM

## 2019-01-08 DIAGNOSIS — Z23 Encounter for immunization: Secondary | ICD-10-CM | POA: Diagnosis not present

## 2019-01-08 DIAGNOSIS — E785 Hyperlipidemia, unspecified: Secondary | ICD-10-CM

## 2019-01-08 DIAGNOSIS — D649 Anemia, unspecified: Secondary | ICD-10-CM | POA: Insufficient documentation

## 2019-01-08 DIAGNOSIS — I10 Essential (primary) hypertension: Secondary | ICD-10-CM | POA: Diagnosis not present

## 2019-01-08 DIAGNOSIS — N183 Chronic kidney disease, stage 3 unspecified: Secondary | ICD-10-CM

## 2019-01-08 DIAGNOSIS — E538 Deficiency of other specified B group vitamins: Secondary | ICD-10-CM

## 2019-01-08 LAB — BASIC METABOLIC PANEL
BUN: 18 mg/dL (ref 6–23)
CO2: 26 mEq/L (ref 19–32)
Calcium: 8.8 mg/dL (ref 8.4–10.5)
Chloride: 106 mEq/L (ref 96–112)
Creatinine, Ser: 1.62 mg/dL — ABNORMAL HIGH (ref 0.40–1.50)
GFR: 41.5 mL/min — ABNORMAL LOW (ref >60.00)
Glucose, Bld: 95 mg/dL (ref 70–99)
Potassium: 4.8 mEq/L (ref 3.5–5.1)
Sodium: 139 mEq/L (ref 135–145)

## 2019-01-08 LAB — CBC WITH DIFFERENTIAL/PLATELET
Basophils Absolute: 0.1 10*3/uL (ref 0.0–0.1)
Basophils Relative: 0.9 % (ref 0.0–3.0)
Eosinophils Absolute: 0.2 10*3/uL (ref 0.0–0.7)
Eosinophils Relative: 2.8 % (ref 0.0–5.0)
HCT: 39.7 % (ref 39.0–52.0)
Hemoglobin: 13.4 g/dL (ref 13.0–17.0)
Lymphocytes Relative: 26.4 % (ref 12.0–46.0)
Lymphs Abs: 1.9 10*3/uL (ref 0.7–4.0)
MCHC: 33.7 g/dL (ref 30.0–36.0)
MCV: 89.9 fl (ref 78.0–100.0)
Monocytes Absolute: 0.7 10*3/uL (ref 0.1–1.0)
Monocytes Relative: 10 % (ref 3.0–12.0)
Neutro Abs: 4.4 10*3/uL (ref 1.4–7.7)
Neutrophils Relative %: 59.9 % (ref 43.0–77.0)
Platelets: 184 10*3/uL (ref 150.0–400.0)
RBC: 4.41 Mil/uL (ref 4.22–5.81)
RDW: 14.5 % (ref 11.5–15.5)
WBC: 7.3 10*3/uL (ref 4.0–10.5)

## 2019-01-08 LAB — LIPID PANEL
Cholesterol: 179 mg/dL (ref 0–200)
HDL: 55.3 mg/dL (ref >39.00)
LDL Cholesterol: 90 mg/dL (ref 0–99)
NonHDL: 123.57
Total CHOL/HDL Ratio: 3
Triglycerides: 169 mg/dL — ABNORMAL HIGH (ref 0.0–149.0)
VLDL: 33.8 mg/dL (ref 0.0–40.0)

## 2019-01-08 LAB — VITAMIN B12: Vitamin B-12: 985 pg/mL — ABNORMAL HIGH (ref 211–911)

## 2019-01-08 LAB — HEMOGLOBIN A1C: Hgb A1c MFr Bld: 5.6 % (ref 4.6–6.5)

## 2019-01-08 LAB — IBC PANEL
Iron: 72 ug/dL (ref 42–165)
Saturation Ratios: 20.3 % (ref 20.0–50.0)
Transferrin: 253 mg/dL (ref 212.0–360.0)

## 2019-01-08 LAB — FERRITIN: Ferritin: 13.6 ng/mL — ABNORMAL LOW (ref 22.0–322.0)

## 2019-01-08 MED ORDER — SIMVASTATIN 40 MG PO TABS: 40.0000 mg | ORAL_TABLET | Freq: Every evening | ORAL | 3 refills | Status: DC

## 2019-01-08 MED ORDER — POLYSACCHARIDE IRON COMPLEX 150 MG PO CAPS: 150.0000 mg | ORAL_CAPSULE | Freq: Every day | ORAL | 0 refills | Status: DC

## 2019-01-08 MED ORDER — ZOSTER VAC RECOMB ADJUVANTED 50 MCG/0.5ML IM SUSR: 0.5000 mL | Freq: Once | INTRAMUSCULAR | 1 refills | Status: AC

## 2019-01-08 NOTE — Telephone Encounter (Signed)
Pt has viewed results via MyChart  

## 2019-01-08 NOTE — Telephone Encounter (Signed)
-----   Message from Biagio Borg, MD sent at 01/08/2019  1:11 PM EDT ----- Left message on MyChart, pt to cont same tx except  The test results show that your current treatment is OK, as the tests stable including the Hemoglobin, except the ferritin level is low (which means your "backup" iron in your body is on the low side even though the actual iron level is normal), and the kidney function remains low.  We need to: 1)  Take nu-iron 1 per day for 3 months 2)  Refer to Nephrology for slow kidneys as we discussed    Leslie Jester to please inform pt, I will do referral and rx
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
## 2019-01-08 NOTE — Patient Instructions (Addendum)
You had the Shingles shot prescription sent to the pharmacy today  You had the Tdap tetanus shot today  Please continue all other medications as before, and refills have been done if requested.  Please have the pharmacy call with any other refills you may need.  Please continue your efforts at being more active, low cholesterol diet, and weight control.  Please keep your appointments with your specialists as you may have planned  Please go to the LAB in the Basement (turn left off the elevator) for the tests to be done today  You will be contacted by phone if any changes need to be made immediately.  Otherwise, you will receive a letter about your results with an explanation, but please check with MyChart first.  Please remember to sign up for MyChart if you have not done so, as this will be important to you in the future with finding out test results, communicating by private email, and scheduling acute appointments online when needed.  Please return in 6 months, or sooner if needed, with Lab testing done 3-5 days before

## 2019-01-10 ENCOUNTER — Encounter: Payer: Self-pay | Admitting: Internal Medicine

## 2019-01-10 NOTE — Assessment & Plan Note (Signed)
stable overall by history and exam, recent data reviewed with pt, and pt to continue medical treatment as before,  to f/u any worsening symptoms or concerns  

## 2019-01-10 NOTE — Assessment & Plan Note (Signed)
?   Related to CKD vs other, for lab f/u

## 2019-01-10 NOTE — Assessment & Plan Note (Addendum)
stable overall by history and exam, recent data reviewed with pt, and pt to continue medical treatment as before,  to f/u any worsening symptoms or , consider renal referral

## 2019-01-11 ENCOUNTER — Other Ambulatory Visit: Payer: Self-pay | Admitting: Internal Medicine

## 2019-01-11 NOTE — Telephone Encounter (Signed)
Done erx 

## 2019-01-13 ENCOUNTER — Encounter: Payer: Self-pay | Admitting: Internal Medicine

## 2019-01-13 MED ORDER — TEMAZEPAM 30 MG PO CAPS: ORAL_CAPSULE | ORAL | 1 refills | Status: DC

## 2019-01-13 NOTE — Telephone Encounter (Signed)
Done erx 

## 2019-01-26 ENCOUNTER — Other Ambulatory Visit: Payer: Self-pay | Admitting: Internal Medicine

## 2019-02-07 ENCOUNTER — Other Ambulatory Visit: Payer: Self-pay | Admitting: Internal Medicine

## 2019-02-08 MED ORDER — TERBINAFINE HCL 250 MG PO TABS: 250.0000 mg | ORAL_TABLET | Freq: Every day | ORAL | 0 refills | Status: DC

## 2019-02-19 ENCOUNTER — Other Ambulatory Visit: Payer: Self-pay | Admitting: Internal Medicine

## 2019-02-23 ENCOUNTER — Other Ambulatory Visit: Payer: Self-pay

## 2019-02-23 ENCOUNTER — Ambulatory Visit (INDEPENDENT_AMBULATORY_CARE_PROVIDER_SITE_OTHER): Payer: Medicare Other | Admitting: Internal Medicine

## 2019-02-23 ENCOUNTER — Encounter: Payer: Self-pay | Admitting: Internal Medicine

## 2019-02-23 DIAGNOSIS — L609 Nail disorder, unspecified: Secondary | ICD-10-CM | POA: Diagnosis not present

## 2019-02-23 DIAGNOSIS — R739 Hyperglycemia, unspecified: Secondary | ICD-10-CM | POA: Diagnosis not present

## 2019-02-23 DIAGNOSIS — I10 Essential (primary) hypertension: Secondary | ICD-10-CM

## 2019-02-23 NOTE — Assessment & Plan Note (Signed)
C/w subungual hematoma with nail loosening, declines podiatry referral for now

## 2019-02-23 NOTE — Assessment & Plan Note (Signed)
stable overall by history and exam, recent data reviewed with pt, and pt to continue medical treatment as before,  to f/u any worsening symptoms or concerns  

## 2019-02-23 NOTE — Patient Instructions (Signed)
Please call if you change your mind about the podiatry referral  OK to stop the terbinifine  Please continue all other medications as before, and refills have been done if requested.  Please have the pharmacy call with any other refills you may need.  Please continue your efforts at being more active, low cholesterol diet, and weight control.  Please keep your appointments with your specialists as you may have planned
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
## 2019-02-26 ENCOUNTER — Other Ambulatory Visit: Payer: Self-pay | Admitting: Internal Medicine

## 2019-03-05 DIAGNOSIS — L821 Other seborrheic keratosis: Secondary | ICD-10-CM | POA: Diagnosis not present

## 2019-03-05 DIAGNOSIS — L570 Actinic keratosis: Secondary | ICD-10-CM | POA: Diagnosis not present

## 2019-03-05 DIAGNOSIS — D1801 Hemangioma of skin and subcutaneous tissue: Secondary | ICD-10-CM | POA: Diagnosis not present

## 2019-03-05 DIAGNOSIS — L814 Other melanin hyperpigmentation: Secondary | ICD-10-CM | POA: Diagnosis not present

## 2019-03-05 DIAGNOSIS — Z85828 Personal history of other malignant neoplasm of skin: Secondary | ICD-10-CM | POA: Diagnosis not present
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
## 2019-03-16 ENCOUNTER — Ambulatory Visit (INDEPENDENT_AMBULATORY_CARE_PROVIDER_SITE_OTHER): Payer: Medicare Other | Admitting: *Deleted

## 2019-03-16 DIAGNOSIS — Z0000 Encounter for general adult medical examination without abnormal findings: Secondary | ICD-10-CM

## 2019-03-22 ENCOUNTER — Other Ambulatory Visit: Payer: Self-pay | Admitting: Internal Medicine

## 2019-03-22 NOTE — Telephone Encounter (Signed)
Patient is reqesting loperamide (IMODIUM) 2 MG capsule He has been having diaherra every couple hours for 8 days and has not been able to work. He is wanting a refill on this. If not possible pls call him at [phone] asap and he will make an appt. He needs to control this today. [id number] - [place], [place] [address] [place] [phone] (Phone) [phone] (Fax)   Refill  Or  Appt!

## 2019-03-22 NOTE — Telephone Encounter (Signed)
I spoke with the patient. He stated that he has been having uncontrolled diarrhea and the Imodium 2mg  isn't helping. I informed him that it is a possibility that PCP may require a virtual to touch base to make sure its no underlying cause of the diarrhea. He expressed understanding. He is aware that PCP doesn't come in until the afternoon on Mondays but I will call him back with an update this afternoon.

## 2019-03-23 ENCOUNTER — Ambulatory Visit (INDEPENDENT_AMBULATORY_CARE_PROVIDER_SITE_OTHER): Payer: Medicare Other | Admitting: Internal Medicine

## 2019-03-23 DIAGNOSIS — R197 Diarrhea, unspecified: Secondary | ICD-10-CM

## 2019-03-23 DIAGNOSIS — R739 Hyperglycemia, unspecified: Secondary | ICD-10-CM | POA: Diagnosis not present

## 2019-03-23 DIAGNOSIS — I10 Essential (primary) hypertension: Secondary | ICD-10-CM

## 2019-03-23 DIAGNOSIS — N183 Chronic kidney disease, stage 3 unspecified: Secondary | ICD-10-CM

## 2019-03-23 MED ORDER — DIPHENOXYLATE-ATROPINE 2.5-0.025 MG PO TABS: ORAL_TABLET | ORAL | 1 refills | Status: DC
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
## 2019-03-25 ENCOUNTER — Other Ambulatory Visit: Payer: Self-pay | Admitting: Internal Medicine

## 2019-03-27 ENCOUNTER — Encounter: Payer: Self-pay | Admitting: Internal Medicine

## 2019-03-27 NOTE — Assessment & Plan Note (Signed)
stable overall by history and exam, recent data reviewed with pt, and pt to continue medical treatment as before,  to f/u any worsening symptoms or concerns  

## 2019-03-27 NOTE — Assessment & Plan Note (Signed)
Etiology unclear, for f/u COVID testing, but also labs as ordered if COVID neg, also lomotil prn

## 2019-03-27 NOTE — Assessment & Plan Note (Signed)
To continue to monitor BP at home and next visit 

## 2019-03-27 NOTE — Assessment & Plan Note (Signed)
For f/u with labs 

## 2019-03-27 NOTE — Patient Instructions (Signed)
Please take all new medication as prescribed  If your COVID is negative, please go to lab for other testing

## 2019-03-29 ENCOUNTER — Other Ambulatory Visit (INDEPENDENT_AMBULATORY_CARE_PROVIDER_SITE_OTHER): Payer: Medicare Other

## 2019-03-29 ENCOUNTER — Encounter: Payer: Self-pay | Admitting: Internal Medicine

## 2019-03-29 ENCOUNTER — Telehealth: Payer: Self-pay | Admitting: *Deleted

## 2019-03-29 ENCOUNTER — Telehealth: Payer: Self-pay | Admitting: Emergency Medicine

## 2019-03-29 DIAGNOSIS — R197 Diarrhea, unspecified: Secondary | ICD-10-CM

## 2019-03-29 LAB — CBC WITH DIFFERENTIAL/PLATELET
Basophils Absolute: 0.1 10*3/uL (ref 0.0–0.1)
Basophils Relative: 0.8 % (ref 0.0–3.0)
Eosinophils Absolute: 0.5 10*3/uL (ref 0.0–0.7)
Eosinophils Relative: 5.4 % — ABNORMAL HIGH (ref 0.0–5.0)
HCT: 38.2 % — ABNORMAL LOW (ref 39.0–52.0)
Hemoglobin: 13 g/dL (ref 13.0–17.0)
Lymphocytes Relative: 20.1 % (ref 12.0–46.0)
Lymphs Abs: 1.9 10*3/uL (ref 0.7–4.0)
MCHC: 34 g/dL (ref 30.0–36.0)
MCV: 86.8 fl (ref 78.0–100.0)
Monocytes Absolute: 1 10*3/uL (ref 0.1–1.0)
Monocytes Relative: 11.2 % (ref 3.0–12.0)
Neutro Abs: 5.9 10*3/uL (ref 1.4–7.7)
Neutrophils Relative %: 62.5 % (ref 43.0–77.0)
Platelets: 247 10*3/uL (ref 150.0–400.0)
RBC: 4.4 Mil/uL (ref 4.22–5.81)
RDW: 14.3 % (ref 11.5–15.5)
WBC: 9.3 10*3/uL (ref 4.0–10.5)

## 2019-03-29 LAB — HEPATIC FUNCTION PANEL
ALT: 16 U/L (ref 0–53)
AST: 14 U/L (ref 0–37)
Albumin: 3.3 g/dL — ABNORMAL LOW (ref 3.5–5.2)
Alkaline Phosphatase: 92 U/L (ref 39–117)
Bilirubin, Direct: 0.1 mg/dL (ref 0.0–0.3)
Total Bilirubin: 0.4 mg/dL (ref 0.2–1.2)
Total Protein: 5.7 g/dL — ABNORMAL LOW (ref 6.0–8.3)

## 2019-03-29 LAB — BASIC METABOLIC PANEL
BUN: 31 mg/dL — ABNORMAL HIGH (ref 6–23)
CO2: 22 mEq/L (ref 19–32)
Calcium: 8.4 mg/dL (ref 8.4–10.5)
Chloride: 108 mEq/L (ref 96–112)
Creatinine, Ser: 1.87 mg/dL — ABNORMAL HIGH (ref 0.40–1.50)
GFR: 35.15 mL/min — ABNORMAL LOW (ref >60.00)
Glucose, Bld: 83 mg/dL (ref 70–99)
Potassium: 4.5 mEq/L (ref 3.5–5.1)
Sodium: 139 mEq/L (ref 135–145)

## 2019-03-29 MED ORDER — DIPHENOXYLATE-ATROPINE 2.5-0.025 MG PO TABS: ORAL_TABLET | ORAL | 1 refills | Status: DC

## 2019-03-29 NOTE — Telephone Encounter (Signed)
Copied from [facility] [phone]. Topic: General - Other >> Mar 29, 2019  9:03 AM Pauline Good wrote: Reason for CRM: pt called and stated he is still experiencing diarrhea and want to know what Dr Jenny Reichmann want him to do. Please call pt to advise

## 2019-03-29 NOTE — Telephone Encounter (Signed)
Pt informed of below.  

## 2019-03-29 NOTE — Telephone Encounter (Signed)
Ok to let pt know - rx is refilled  Would he like referral to GI? - just let me know

## 2019-03-29 NOTE — Telephone Encounter (Signed)
unfortuanately cannot do this at the moment due to computer glitch (none of the doctors can send right now)  Will try to do this asap when the software is fixed

## 2019-03-29 NOTE — Telephone Encounter (Signed)
Pt is requesting a refill on his diphenoxylate-atropine (LOMOTIL) 2.5-0.025 MG tablet if Dr Jenny Reichmann advises refill after lab work done today. Pharmacy is [facility] Thanks.

## 2019-03-29 NOTE — Telephone Encounter (Signed)
Ok to come for labs as ordered

## 2019-03-30 ENCOUNTER — Other Ambulatory Visit: Payer: Medicare Other

## 2019-03-30 DIAGNOSIS — R197 Diarrhea, unspecified: Secondary | ICD-10-CM

## 2019-03-30 NOTE — Addendum Note (Signed)
Addended by: Biagio Borg on: 03/30/2019 04:14 PM   Modules accepted: Orders

## 2019-03-30 NOTE — Telephone Encounter (Signed)
Pt informed of below. He wants to wait for his stool study results. He will let us know about the GI referral.

## 2019-04-01 DIAGNOSIS — E611 Iron deficiency: Secondary | ICD-10-CM | POA: Diagnosis not present

## 2019-04-01 DIAGNOSIS — R197 Diarrhea, unspecified: Secondary | ICD-10-CM | POA: Diagnosis not present

## 2019-04-01 DIAGNOSIS — K219 Gastro-esophageal reflux disease without esophagitis: Secondary | ICD-10-CM | POA: Diagnosis not present

## 2019-04-02 ENCOUNTER — Other Ambulatory Visit: Payer: Medicare Other

## 2019-04-03 LAB — GASTROINTESTINAL PATHOGEN PANEL PCR
C. difficile Tox A/B, PCR: NOT DETECTED
Campylobacter, PCR: NOT DETECTED
Cryptosporidium, PCR: NOT DETECTED
E coli (ETEC) LT/ST PCR: NOT DETECTED
E coli (STEC) stx1/stx2, PCR: NOT DETECTED
E coli 0157, PCR: NOT DETECTED
Giardia lamblia, PCR: NOT DETECTED
Norovirus, PCR: NOT DETECTED
Rotavirus A, PCR: NOT DETECTED
Salmonella, PCR: NOT DETECTED
Shigella, PCR: NOT DETECTED

## 2019-04-03 LAB — STOOL CULTURE
MICRO NUMBER:: 1059367
MICRO NUMBER:: 1059368
MICRO NUMBER:: 1059369
SHIGA RESULT:: NOT DETECTED
SPECIMEN QUALITY:: ADEQUATE
SPECIMEN QUALITY:: ADEQUATE
SPECIMEN QUALITY:: ADEQUATE

## 2019-04-16 ENCOUNTER — Other Ambulatory Visit: Payer: Self-pay | Admitting: Internal Medicine

## 2019-04-21 DIAGNOSIS — Z1159 Encounter for screening for other viral diseases: Secondary | ICD-10-CM | POA: Diagnosis not present

## 2019-04-27 DIAGNOSIS — Z1211 Encounter for screening for malignant neoplasm of colon: Secondary | ICD-10-CM | POA: Diagnosis not present

## 2019-04-27 DIAGNOSIS — K648 Other hemorrhoids: Secondary | ICD-10-CM | POA: Diagnosis not present

## 2019-04-27 DIAGNOSIS — E611 Iron deficiency: Secondary | ICD-10-CM | POA: Diagnosis not present

## 2019-04-27 DIAGNOSIS — D509 Iron deficiency anemia, unspecified: Secondary | ICD-10-CM | POA: Diagnosis not present

## 2019-04-27 DIAGNOSIS — K921 Melena: Secondary | ICD-10-CM | POA: Diagnosis not present

## 2019-04-27 DIAGNOSIS — R197 Diarrhea, unspecified: Secondary | ICD-10-CM | POA: Diagnosis not present

## 2019-04-27 DIAGNOSIS — Z86010 Personal history of colonic polyps: Secondary | ICD-10-CM | POA: Diagnosis not present

## 2019-04-27 DIAGNOSIS — K219 Gastro-esophageal reflux disease without esophagitis: Secondary | ICD-10-CM | POA: Diagnosis not present

## 2019-04-27 DIAGNOSIS — K449 Diaphragmatic hernia without obstruction or gangrene: Secondary | ICD-10-CM | POA: Diagnosis not present

## 2019-06-04 DIAGNOSIS — R197 Diarrhea, unspecified: Secondary | ICD-10-CM | POA: Diagnosis not present

## 2019-06-04 DIAGNOSIS — D509 Iron deficiency anemia, unspecified: Secondary | ICD-10-CM | POA: Diagnosis not present

## 2019-06-04 DIAGNOSIS — K219 Gastro-esophageal reflux disease without esophagitis: Secondary | ICD-10-CM | POA: Diagnosis not present

## 2019-06-07 ENCOUNTER — Encounter: Payer: Self-pay | Admitting: Internal Medicine

## 2019-07-06 ENCOUNTER — Other Ambulatory Visit: Payer: Self-pay | Admitting: Internal Medicine

## 2019-07-07 DIAGNOSIS — I129 Hypertensive chronic kidney disease with stage 1 through stage 4 chronic kidney disease, or unspecified chronic kidney disease: Secondary | ICD-10-CM | POA: Diagnosis not present

## 2019-07-07 DIAGNOSIS — N1832 Chronic kidney disease, stage 3b: Secondary | ICD-10-CM | POA: Diagnosis not present

## 2019-07-08 DIAGNOSIS — H25813 Combined forms of age-related cataract, bilateral: Secondary | ICD-10-CM | POA: Diagnosis not present

## 2019-07-08 DIAGNOSIS — H43813 Vitreous degeneration, bilateral: Secondary | ICD-10-CM | POA: Diagnosis not present

## 2019-07-13 ENCOUNTER — Encounter: Payer: Self-pay | Admitting: Internal Medicine

## 2019-07-14 MED ORDER — TEMAZEPAM 30 MG PO CAPS: ORAL_CAPSULE | ORAL | 1 refills | Status: DC

## 2019-07-14 MED ORDER — POLYSACCHARIDE IRON COMPLEX 150 MG PO CAPS: ORAL_CAPSULE | ORAL | 5 refills | Status: DC

## 2019-07-14 NOTE — Telephone Encounter (Signed)
Staff to assist with PA if needed

## 2019-07-15 ENCOUNTER — Ambulatory Visit: Payer: Medicare Other | Admitting: Internal Medicine

## 2019-07-17 ENCOUNTER — Telehealth: Payer: Self-pay | Admitting: Internal Medicine

## 2019-07-17 MED ORDER — TEMAZEPAM 30 MG PO CAPS: ORAL_CAPSULE | ORAL | 1 refills | Status: DC

## 2019-07-17 NOTE — Telephone Encounter (Signed)
Done erx 

## 2019-07-19 ENCOUNTER — Encounter: Payer: Self-pay | Admitting: Internal Medicine

## 2019-07-19 ENCOUNTER — Ambulatory Visit (INDEPENDENT_AMBULATORY_CARE_PROVIDER_SITE_OTHER): Payer: Medicare Other | Admitting: Internal Medicine

## 2019-07-19 DIAGNOSIS — E785 Hyperlipidemia, unspecified: Secondary | ICD-10-CM | POA: Diagnosis not present

## 2019-07-19 DIAGNOSIS — R739 Hyperglycemia, unspecified: Secondary | ICD-10-CM | POA: Diagnosis not present

## 2019-07-19 DIAGNOSIS — N183 Chronic kidney disease, stage 3 unspecified: Secondary | ICD-10-CM

## 2019-07-19 DIAGNOSIS — I10 Essential (primary) hypertension: Secondary | ICD-10-CM | POA: Diagnosis not present

## 2019-07-19 NOTE — Patient Instructions (Signed)
Please continue all other medications as before, and refills have been done if requested.  Please have the pharmacy call with any other refills you may need.  Please continue your efforts at being more active, low cholesterol diet, and weight control.  Please keep your appointments with your specialists as you may have planned     

## 2019-07-19 NOTE — Assessment & Plan Note (Signed)
To f/u renal as planned o/w stable

## 2019-07-19 NOTE — Assessment & Plan Note (Signed)
Encouraged to continue to f/u bp at home and next visit
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
## 2019-07-19 NOTE — Assessment & Plan Note (Signed)
stable overall by history and exam, recent data reviewed with pt, and pt to continue medical treatment as before,  to f/u any worsening symptoms or concerns  

## 2019-07-19 NOTE — Assessment & Plan Note (Addendum)
stable overall by history and exam, recent data reviewed with pt, and pt to continue medical treatment as before,  to f/u any worsening symptoms or concerns  I spent 30 minutes in preparing to see the patient by review of recent labs, imaging and procedures, obtaining and reviewing separately obtained history, communicating with the patient and family or caregiver, ordering medications, tests or procedures, and documenting clinical information in the EHR including the differential Dx, treatment, and any further evaluation and other management of HLD, hyperglycemia, CKD, HTN

## 2019-07-20 ENCOUNTER — Telehealth: Payer: Self-pay

## 2019-07-20 NOTE — Telephone Encounter (Signed)
Received approval for BCBSfor Restoril 30mg .  Valid from 06/19/2019 to 07/18/2020.   Faxed approval to [facility].   Patient is aware

## 2019-08-19 ENCOUNTER — Other Ambulatory Visit: Payer: Self-pay | Admitting: Internal Medicine

## 2019-09-03 DIAGNOSIS — K219 Gastro-esophageal reflux disease without esophagitis: Secondary | ICD-10-CM | POA: Diagnosis not present

## 2019-09-03 DIAGNOSIS — R197 Diarrhea, unspecified: Secondary | ICD-10-CM | POA: Diagnosis not present

## 2019-09-27 DIAGNOSIS — N1832 Chronic kidney disease, stage 3b: Secondary | ICD-10-CM | POA: Diagnosis not present

## 2019-09-27 DIAGNOSIS — I129 Hypertensive chronic kidney disease with stage 1 through stage 4 chronic kidney disease, or unspecified chronic kidney disease: Secondary | ICD-10-CM | POA: Diagnosis not present

## 2019-09-27 DIAGNOSIS — D631 Anemia in chronic kidney disease: Secondary | ICD-10-CM | POA: Diagnosis not present

## 2019-10-28 ENCOUNTER — Other Ambulatory Visit: Payer: Self-pay | Admitting: Internal Medicine

## 2019-10-28 NOTE — Telephone Encounter (Signed)
Check [place] registry last filled 04/16/2019. MD is out of the office pls advise on refill.Marland KitchenJohny Chess

## 2019-11-01 ENCOUNTER — Telehealth: Payer: Self-pay | Admitting: Internal Medicine

## 2019-11-01 NOTE — Telephone Encounter (Signed)
[place] for simvastatin refill  Unable to refill temazepam as is too soon, was given rx for 6 mo med on feb 2021

## 2019-11-01 NOTE — Telephone Encounter (Signed)
1.Medication Requested:testosterone (ANDROGEL) 50 MG/5GM (1%) GEL  2. Pharmacy (Name, [place], [place]):[facility] [place] [facility] Registered [facility]  3. On Med List: Yes   4. Last Visit with PCP: 2.22.21   5. Next visit date with PCP: 8.12.21    Agent: Please be advised that RX refills may take up to 3 business days. We ask that you follow-up with your pharmacy.

## 2019-11-02 ENCOUNTER — Encounter: Payer: Self-pay | Admitting: Internal Medicine

## 2019-11-02 NOTE — Telephone Encounter (Signed)
    Patient states insurance is requesting prior [place]

## 2019-11-08 NOTE — Telephone Encounter (Signed)
Testosterone 1% gel Key: [id number] Temazepam 30 mg capsule Key: [id number]

## 2019-11-09 ENCOUNTER — Other Ambulatory Visit: Payer: Self-pay

## 2019-11-09 ENCOUNTER — Encounter: Payer: Self-pay | Admitting: Internal Medicine

## 2019-11-09 ENCOUNTER — Ambulatory Visit (INDEPENDENT_AMBULATORY_CARE_PROVIDER_SITE_OTHER): Payer: Medicare Other | Admitting: Internal Medicine

## 2019-11-09 VITALS — BP 136/82 | HR 71 | Temp 97.8°F | Ht 72.0 in | Wt 245.0 lb

## 2019-11-09 DIAGNOSIS — M79672 Pain in left foot: Secondary | ICD-10-CM

## 2019-11-09 DIAGNOSIS — M519 Unspecified thoracic, thoracolumbar and lumbosacral intervertebral disc disorder: Secondary | ICD-10-CM | POA: Diagnosis not present

## 2019-11-09 DIAGNOSIS — E291 Testicular hypofunction: Secondary | ICD-10-CM | POA: Diagnosis not present

## 2019-11-09 DIAGNOSIS — G4700 Insomnia, unspecified: Secondary | ICD-10-CM | POA: Diagnosis not present

## 2019-11-09 DIAGNOSIS — N183 Chronic kidney disease, stage 3 unspecified: Secondary | ICD-10-CM | POA: Diagnosis not present

## 2019-11-09 DIAGNOSIS — R7303 Prediabetes: Secondary | ICD-10-CM

## 2019-11-09 DIAGNOSIS — M48062 Spinal stenosis, lumbar region with neurogenic claudication: Secondary | ICD-10-CM | POA: Diagnosis not present

## 2019-11-09 DIAGNOSIS — Z9889 Other specified postprocedural states: Secondary | ICD-10-CM | POA: Diagnosis not present

## 2019-11-09 DIAGNOSIS — G9519 Other vascular myelopathies: Secondary | ICD-10-CM

## 2019-11-09 MED ORDER — TESTOSTERONE 50 MG/5GM (1%) TD GEL: TRANSDERMAL | 1 refills | Status: DC

## 2019-11-09 MED ORDER — PREDNISONE 10 MG PO TABS: ORAL_TABLET | ORAL | 0 refills | Status: DC

## 2019-11-09 MED ORDER — TEMAZEPAM 30 MG PO CAPS: ORAL_CAPSULE | ORAL | 1 refills | Status: DC

## 2019-11-09 MED ORDER — PANTOPRAZOLE SODIUM 40 MG PO TBEC: 40.0000 mg | DELAYED_RELEASE_TABLET | Freq: Two times a day (BID) | ORAL | 3 refills | Status: DC
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
## 2019-11-09 NOTE — Telephone Encounter (Signed)
Dr. Jenny Reichmann,  Can you send in the refill for the testosterone please.    Scheduling,  Can you get pt scheduled for gout flare up, please?

## 2019-11-09 NOTE — Telephone Encounter (Signed)
Patient has an appointment today with Dr.John already.

## 2019-11-09 NOTE — Patient Instructions (Signed)
Please take all new medication as prescribed - the prednisone  Please continue all other medications as before, and refills have been done if requested.  Please have the pharmacy call with any other refills you may need.  Please continue your efforts at being more active, low cholesterol diet, and weight control.  You are otherwise up to date with prevention measures today.  Please keep your appointments with your specialists as you may have planned - nephrology for CKD 3  You will be contacted regarding the referral for: MRI and Dr Lorin Mercy

## 2019-11-10 NOTE — Telephone Encounter (Signed)
Staff to address please

## 2019-11-11 ENCOUNTER — Encounter: Payer: Self-pay | Admitting: Internal Medicine

## 2019-11-11 MED ORDER — ALLOPURINOL 100 MG PO TABS: 100.0000 mg | ORAL_TABLET | Freq: Every day | ORAL | 3 refills | Status: DC

## 2019-11-11 NOTE — Telephone Encounter (Signed)
Pt contacted and informed that the PA for the temazepam and the testosterone has been approved and mentioned the earlier mychart message that was sent. Pt sttated understanding.   Pt would like to know if there is a preventive gout rx. Please advise.

## 2019-11-14 ENCOUNTER — Encounter: Payer: Self-pay | Admitting: Internal Medicine

## 2019-11-14 NOTE — Assessment & Plan Note (Signed)
To f;u renal,  to f/u any worsening symptoms or concerns

## 2019-11-14 NOTE — Assessment & Plan Note (Signed)
For f/u MRI and refer ortho dr Lorin Mercy

## 2019-11-14 NOTE — Assessment & Plan Note (Addendum)
Clinical diagnosis, For f/u MRI and refer ortho dr Lorin Mercy  I spent 41 minutes in preparing to see the patient by review of recent labs, imaging and procedures, obtaining and reviewing separately obtained history, communicating with the patient and family or caregiver, ordering medications, tests or procedures, and documenting clinical information in the EHR including the differential Dx, treatment, and any further evaluation and other management of lbp, lumbar disc disease s/p decompression, neurogenic claudication, hypogonadism, insomnia, acute gouty arthritis, ckd

## 2019-11-14 NOTE — Assessment & Plan Note (Signed)
C/w acute gouty arthritis - for predpac asd,  to f/u any worsening symptoms or concerns

## 2019-11-14 NOTE — Assessment & Plan Note (Signed)
[facility] [employer] refill,  to f/u any worsening symptoms or concerns

## 2019-11-14 NOTE — Assessment & Plan Note (Signed)
stable overall by history and exam, recent data reviewed with pt, and pt to continue medical treatment as before,  to f/u any worsening symptoms or concerns  

## 2019-11-14 NOTE — Assessment & Plan Note (Signed)
For labs and refill,  to f/u any worsening symptoms or concerns

## 2019-12-06 ENCOUNTER — Other Ambulatory Visit: Payer: Self-pay

## 2019-12-06 ENCOUNTER — Ambulatory Visit
Admission: RE | Admit: 2019-12-06 | Discharge: 2019-12-06 | Disposition: A | Discharge status: Home / Self Care | Payer: Medicare Other | Source: Ambulatory Visit | Attending: Internal Medicine | Admitting: Internal Medicine

## 2019-12-06 ENCOUNTER — Encounter: Payer: Self-pay | Admitting: Internal Medicine

## 2019-12-06 DIAGNOSIS — M519 Unspecified thoracic, thoracolumbar and lumbosacral intervertebral disc disorder: Secondary | ICD-10-CM

## 2019-12-06 DIAGNOSIS — M48061 Spinal stenosis, lumbar region without neurogenic claudication: Secondary | ICD-10-CM | POA: Diagnosis not present

## 2019-12-08 ENCOUNTER — Ambulatory Visit (INDEPENDENT_AMBULATORY_CARE_PROVIDER_SITE_OTHER): Payer: Medicare Other | Admitting: Orthopaedic Surgery

## 2019-12-08 ENCOUNTER — Encounter: Payer: Self-pay | Admitting: Orthopaedic Surgery

## 2019-12-08 DIAGNOSIS — M519 Unspecified thoracic, thoracolumbar and lumbosacral intervertebral disc disorder: Secondary | ICD-10-CM
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
## 2019-12-30 ENCOUNTER — Other Ambulatory Visit: Payer: Self-pay | Admitting: Internal Medicine

## 2020-01-04 ENCOUNTER — Encounter: Payer: Self-pay | Admitting: Orthopaedic Surgery

## 2020-01-04 ENCOUNTER — Ambulatory Visit (INDEPENDENT_AMBULATORY_CARE_PROVIDER_SITE_OTHER): Payer: Medicare Other | Admitting: Orthopaedic Surgery

## 2020-01-04 VITALS — Ht 72.0 in | Wt 245.0 lb

## 2020-01-04 DIAGNOSIS — M519 Unspecified thoracic, thoracolumbar and lumbosacral intervertebral disc disorder: Secondary | ICD-10-CM | POA: Diagnosis not present
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
## 2020-01-06 ENCOUNTER — Other Ambulatory Visit (INDEPENDENT_AMBULATORY_CARE_PROVIDER_SITE_OTHER): Payer: Medicare Other

## 2020-01-06 ENCOUNTER — Other Ambulatory Visit: Payer: Self-pay | Admitting: Internal Medicine

## 2020-01-06 ENCOUNTER — Other Ambulatory Visit: Payer: Self-pay

## 2020-01-06 ENCOUNTER — Ambulatory Visit (INDEPENDENT_AMBULATORY_CARE_PROVIDER_SITE_OTHER): Payer: Medicare Other | Admitting: Internal Medicine

## 2020-01-06 ENCOUNTER — Encounter: Payer: Self-pay | Admitting: Internal Medicine

## 2020-01-06 ENCOUNTER — Ambulatory Visit (HOSPITAL_COMMUNITY)
Admission: RE | Admit: 2020-01-06 | Discharge: 2020-01-06 | Disposition: A | Discharge status: Home / Self Care | Payer: Medicare Other | Source: Ambulatory Visit | Attending: Orthopaedic Surgery | Admitting: Orthopaedic Surgery

## 2020-01-06 VITALS — BP 150/68 | HR 52 | Temp 98.7°F | Ht 72.0 in | Wt 253.0 lb

## 2020-01-06 DIAGNOSIS — R7303 Prediabetes: Secondary | ICD-10-CM

## 2020-01-06 DIAGNOSIS — E785 Hyperlipidemia, unspecified: Secondary | ICD-10-CM

## 2020-01-06 DIAGNOSIS — E291 Testicular hypofunction: Secondary | ICD-10-CM

## 2020-01-06 DIAGNOSIS — R972 Elevated prostate specific antigen [PSA]: Secondary | ICD-10-CM

## 2020-01-06 DIAGNOSIS — M519 Unspecified thoracic, thoracolumbar and lumbosacral intervertebral disc disorder: Secondary | ICD-10-CM | POA: Insufficient documentation

## 2020-01-06 DIAGNOSIS — R739 Hyperglycemia, unspecified: Secondary | ICD-10-CM

## 2020-01-06 DIAGNOSIS — N183 Chronic kidney disease, stage 3 unspecified: Secondary | ICD-10-CM

## 2020-01-06 DIAGNOSIS — I10 Essential (primary) hypertension: Secondary | ICD-10-CM

## 2020-01-06 LAB — HEMOGLOBIN A1C: Hgb A1c MFr Bld: 5.7 % (ref 4.6–6.5)

## 2020-01-06 LAB — LIPID PANEL
Cholesterol: 169 mg/dL (ref 0–200)
HDL: 47.1 mg/dL (ref >39.00)
NonHDL: 122.1
Total CHOL/HDL Ratio: 4
Triglycerides: 234 mg/dL — ABNORMAL HIGH (ref 0.0–149.0)
VLDL: 46.8 mg/dL — ABNORMAL HIGH (ref 0.0–40.0)

## 2020-01-06 LAB — CBC WITH DIFFERENTIAL/PLATELET
Basophils Absolute: 0 10*3/uL (ref 0.0–0.1)
Basophils Relative: 0.5 % (ref 0.0–3.0)
Eosinophils Absolute: 0.3 10*3/uL (ref 0.0–0.7)
Eosinophils Relative: 3.8 % (ref 0.0–5.0)
HCT: 39.6 % (ref 39.0–52.0)
Hemoglobin: 13.6 g/dL (ref 13.0–17.0)
Lymphocytes Relative: 31.6 % (ref 12.0–46.0)
Lymphs Abs: 2.5 10*3/uL (ref 0.7–4.0)
MCHC: 34.5 g/dL (ref 30.0–36.0)
MCV: 90.2 fl (ref 78.0–100.0)
Monocytes Absolute: 0.7 10*3/uL (ref 0.1–1.0)
Monocytes Relative: 9 % (ref 3.0–12.0)
Neutro Abs: 4.4 10*3/uL (ref 1.4–7.7)
Neutrophils Relative %: 55.1 % (ref 43.0–77.0)
Platelets: 209 10*3/uL (ref 150.0–400.0)
RBC: 4.39 Mil/uL (ref 4.22–5.81)
RDW: 14.7 % (ref 11.5–15.5)
WBC: 8 10*3/uL (ref 4.0–10.5)

## 2020-01-06 LAB — COMPREHENSIVE METABOLIC PANEL
ALT: 22 U/L (ref 0–53)
AST: 19 U/L (ref 0–37)
Albumin: 4.1 g/dL (ref 3.5–5.2)
Alkaline Phosphatase: 119 U/L — ABNORMAL HIGH (ref 39–117)
BUN: 14 mg/dL (ref 6–23)
CO2: 27 mEq/L (ref 19–32)
Calcium: 9 mg/dL (ref 8.4–10.5)
Chloride: 107 mEq/L (ref 96–112)
Creatinine, Ser: 1.55 mg/dL — ABNORMAL HIGH (ref 0.40–1.50)
GFR: 43.56 mL/min — ABNORMAL LOW (ref >60.00)
Glucose, Bld: 86 mg/dL (ref 70–99)
Potassium: 4.6 mEq/L (ref 3.5–5.1)
Sodium: 140 mEq/L (ref 135–145)
Total Bilirubin: 0.5 mg/dL (ref 0.2–1.2)
Total Protein: 6.6 g/dL (ref 6.0–8.3)

## 2020-01-06 LAB — URINALYSIS, ROUTINE W REFLEX MICROSCOPIC
Bilirubin Urine: NEGATIVE
Hgb urine dipstick: NEGATIVE
Ketones, ur: NEGATIVE
Leukocytes,Ua: NEGATIVE
Nitrite: NEGATIVE
RBC / HPF: NONE SEEN (ref 0–?)
Specific Gravity, Urine: 1.01 (ref 1.000–1.030)
Total Protein, Urine: NEGATIVE
Urine Glucose: NEGATIVE
Urobilinogen, UA: 0.2 (ref 0.0–1.0)
WBC, UA: NONE SEEN (ref 0–?)
pH: 6.5 (ref 5.0–8.0)

## 2020-01-06 LAB — LDL CHOLESTEROL, DIRECT: Direct LDL: 90 mg/dL

## 2020-01-06 LAB — MICROALBUMIN / CREATININE URINE RATIO
Creatinine,U: 45.5 mg/dL
Microalb Creat Ratio: 1.5 mg/g (ref 0.0–30.0)
Microalb, Ur: <0.7 mg/dL (ref 0.0–1.9)

## 2020-01-06 LAB — URIC ACID: Uric Acid, Serum: 6.1 mg/dL (ref 4.0–7.8)

## 2020-01-06 LAB — PSA: PSA: 2.11 ng/mL (ref 0.10–4.00)

## 2020-01-06 LAB — TSH: TSH: 2.66 u[IU]/mL (ref 0.35–4.50)

## 2020-01-06 LAB — VITAMIN D 25 HYDROXY (VIT D DEFICIENCY, FRACTURES): VITD: 21.79 ng/mL — ABNORMAL LOW (ref 30.00–100.00)

## 2020-01-06 LAB — TESTOSTERONE: Testosterone: 536.08 ng/dL (ref 300.00–890.00)

## 2020-01-06 MED ORDER — VITAMIN D (ERGOCALCIFEROL) 1.25 MG (50000 UNIT) PO CAPS: 50000.0000 [IU] | ORAL_CAPSULE | ORAL | 0 refills | Status: DC

## 2020-01-06 NOTE — Progress Notes (Signed)
VASCULAR LAB    ABIs completed.    Preliminary report:  See CV proc for preliminary results.  Eryn Marandola, RVT 01/06/2020, 3:35 PM

## 2020-01-06 NOTE — Patient Instructions (Signed)
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
## 2020-01-08 ENCOUNTER — Encounter: Payer: Self-pay | Admitting: Internal Medicine

## 2020-01-08 NOTE — Assessment & Plan Note (Addendum)
stable overall by history and exam, recent data reviewed with pt, and pt to continue medical treatment as before,  to f/u any worsening symptoms or concerns  I spent 31 minutes in preparing to see the patient by review of recent labs, imaging and procedures, obtaining and reviewing separately obtained history, communicating with the patient and family or caregiver, ordering medications, tests or procedures, and documenting clinical information in the EHR including the differential Dx, treatment, and any further evaluation and other management of preDM, hypogonadism, hld, hyperglycemia, ckd, htn, elev psa

## 2020-01-08 NOTE — Assessment & Plan Note (Signed)
stable overall by history and exam, recent data reviewed with pt, and pt to continue medical treatment as before,  to f/u any worsening symptoms or concerns  

## 2020-01-08 NOTE — Assessment & Plan Note (Signed)
For f/u psa next lab, asympt

## 2020-01-08 NOTE — Assessment & Plan Note (Signed)
For testosterone level next lab

## 2020-01-10 ENCOUNTER — Other Ambulatory Visit: Payer: Self-pay | Admitting: Radiology

## 2020-01-10 ENCOUNTER — Encounter: Payer: Self-pay | Admitting: Orthopaedic Surgery

## 2020-01-10 DIAGNOSIS — M519 Unspecified thoracic, thoracolumbar and lumbosacral intervertebral disc disorder: Secondary | ICD-10-CM

## 2020-01-11 DIAGNOSIS — R972 Elevated prostate specific antigen [PSA]: Secondary | ICD-10-CM | POA: Diagnosis not present

## 2020-01-11 DIAGNOSIS — N401 Enlarged prostate with lower urinary tract symptoms: Secondary | ICD-10-CM | POA: Diagnosis not present

## 2020-01-11 DIAGNOSIS — E291 Testicular hypofunction: Secondary | ICD-10-CM | POA: Diagnosis not present

## 2020-01-11 DIAGNOSIS — N138 Other obstructive and reflux uropathy: Secondary | ICD-10-CM | POA: Diagnosis not present

## 2020-01-11 DIAGNOSIS — N529 Male erectile dysfunction, unspecified: Secondary | ICD-10-CM | POA: Diagnosis not present

## 2020-01-15 ENCOUNTER — Encounter: Payer: Self-pay | Admitting: Orthopaedic Surgery

## 2020-01-22 ENCOUNTER — Other Ambulatory Visit: Payer: Self-pay | Admitting: Internal Medicine

## 2020-01-25 NOTE — Telephone Encounter (Signed)
Done erx 

## 2020-02-01 DIAGNOSIS — Z23 Encounter for immunization: Secondary | ICD-10-CM | POA: Diagnosis not present

## 2020-02-14 ENCOUNTER — Encounter (INDEPENDENT_AMBULATORY_CARE_PROVIDER_SITE_OTHER): Payer: Medicare Other | Admitting: Vascular Surgery

## 2020-03-06 DIAGNOSIS — D1801 Hemangioma of skin and subcutaneous tissue: Secondary | ICD-10-CM | POA: Diagnosis not present

## 2020-03-06 DIAGNOSIS — L821 Other seborrheic keratosis: Secondary | ICD-10-CM | POA: Diagnosis not present

## 2020-03-06 DIAGNOSIS — Z85828 Personal history of other malignant neoplasm of skin: Secondary | ICD-10-CM | POA: Diagnosis not present

## 2020-03-06 DIAGNOSIS — L814 Other melanin hyperpigmentation: Secondary | ICD-10-CM | POA: Diagnosis not present

## 2020-03-06 DIAGNOSIS — L570 Actinic keratosis: Secondary | ICD-10-CM | POA: Diagnosis not present

## 2020-03-06 DIAGNOSIS — L304 Erythema intertrigo: Secondary | ICD-10-CM | POA: Diagnosis not present

## 2020-03-09 ENCOUNTER — Ambulatory Visit (INDEPENDENT_AMBULATORY_CARE_PROVIDER_SITE_OTHER): Payer: Medicare Other | Admitting: Vascular Surgery

## 2020-03-09 ENCOUNTER — Encounter (INDEPENDENT_AMBULATORY_CARE_PROVIDER_SITE_OTHER): Payer: Self-pay | Admitting: Vascular Surgery

## 2020-03-09 ENCOUNTER — Other Ambulatory Visit: Payer: Self-pay

## 2020-03-09 VITALS — BP 116/71 | HR 96 | Resp 18 | Ht 72.0 in | Wt 252.0 lb

## 2020-03-09 DIAGNOSIS — I10 Essential (primary) hypertension: Secondary | ICD-10-CM

## 2020-03-09 DIAGNOSIS — M79605 Pain in left leg: Secondary | ICD-10-CM

## 2020-03-09 DIAGNOSIS — M519 Unspecified thoracic, thoracolumbar and lumbosacral intervertebral disc disorder: Secondary | ICD-10-CM | POA: Diagnosis not present

## 2020-03-09 DIAGNOSIS — I739 Peripheral vascular disease, unspecified: Secondary | ICD-10-CM

## 2020-03-09 DIAGNOSIS — R0989 Other specified symptoms and signs involving the circulatory and respiratory systems: Secondary | ICD-10-CM

## 2020-03-09 DIAGNOSIS — E782 Mixed hyperlipidemia: Secondary | ICD-10-CM | POA: Diagnosis not present

## 2020-03-09 DIAGNOSIS — M79604 Pain in right leg: Secondary | ICD-10-CM | POA: Diagnosis not present

## 2020-03-10 ENCOUNTER — Encounter (INDEPENDENT_AMBULATORY_CARE_PROVIDER_SITE_OTHER): Payer: Self-pay | Admitting: Vascular Surgery

## 2020-03-10 DIAGNOSIS — R0989 Other specified symptoms and signs involving the circulatory and respiratory systems: Secondary | ICD-10-CM | POA: Insufficient documentation

## 2020-03-10 DIAGNOSIS — I739 Peripheral vascular disease, unspecified: Secondary | ICD-10-CM | POA: Insufficient documentation
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
## 2020-03-21 ENCOUNTER — Ambulatory Visit: Payer: Self-pay

## 2020-03-27 ENCOUNTER — Ambulatory Visit (INDEPENDENT_AMBULATORY_CARE_PROVIDER_SITE_OTHER): Payer: Medicare Other

## 2020-03-27 ENCOUNTER — Other Ambulatory Visit: Payer: Self-pay

## 2020-03-27 VITALS — BP 130/80 | HR 92 | Temp 98.3°F | Ht 72.0 in | Wt 254.2 lb

## 2020-03-27 DIAGNOSIS — Z0000 Encounter for general adult medical examination without abnormal findings: Secondary | ICD-10-CM
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
## 2020-03-27 NOTE — Patient Instructions (Signed)
[name] , Thank you for taking time to come for your Medicare Wellness Visit. I appreciate your ongoing commitment to your health goals. Please review the following plan we discussed and let me know if I can assist you in the future.   Screening recommendations/referrals: Colonoscopy: 05/17/2019; due every 5 years Recommended yearly ophthalmology/optometry visit for glaucoma screening and checkup Recommended yearly dental visit for hygiene and checkup  Vaccinations: Influenza vaccine: 02/01/2020 Pneumococcal vaccine: up to date Tdap vaccine: 01/08/2019; due every 10 years Shingles vaccine: up to date   Covid-19: up to date  Advanced directives: Documents on file.  Conditions/risks identified: Yes; Reviewed health maintenance screenings with patient today and relevant education, vaccines, and/or referrals were provided. Please continue to do your personal lifestyle choices by: daily care of teeth and gums, regular physical activity (goal should be 5 days a week for 30 minutes), eat a healthy diet, avoid tobacco and drug use, limiting any alcohol intake, taking a low-dose aspirin (if not allergic or have been advised by your provider otherwise) and taking vitamins and minerals as recommended by your provider. Continue doing brain stimulating activities (puzzles, reading, adult coloring books, staying active) to keep memory sharp. Continue to eat heart healthy diet (full of fruits, vegetables, whole grains, lean protein, water--limit salt, fat, and sugar intake) and increase physical activity as tolerated.  Next appointment: Please schedule your next Medicare Wellness Visit with your Nurse Health Advisor in 1 year.   Preventive Care 78 Years and Older, Male Preventive care refers to lifestyle choices and visits with your health care provider that can promote health and wellness. What does preventive care include?  A yearly physical exam. This is also called an annual well check.  Dental exams  once or twice a year.  Routine eye exams. Ask your health care provider how often you should have your eyes checked.  Personal lifestyle choices, including:  Daily care of your teeth and gums.  Regular physical activity.  Eating a healthy diet.  Avoiding tobacco and drug use.  Limiting alcohol use.  Practicing safe sex.  Taking low doses of aspirin every day.  Taking vitamin and mineral supplements as recommended by your health care provider. What happens during an annual well check? The services and screenings done by your health care provider during your annual well check will depend on your age, overall health, lifestyle risk factors, and family history of disease. Counseling  Your health care provider may ask you questions about your:  Alcohol use.  Tobacco use.  Drug use.  Emotional well-being.  Home and relationship well-being.  Sexual activity.  Eating habits.  History of falls.  Memory and ability to understand (cognition).  Work and work Statistician. Screening  You may have the following tests or measurements:  Height, weight, and BMI.  Blood pressure.  Lipid and cholesterol levels. These may be checked every 5 years, or more frequently if you are over 17 years old.  Skin check.  Lung cancer screening. You may have this screening every year starting at age 78 if you have a 30-pack-year history of smoking and currently smoke or have quit within the past 15 years.  Fecal occult blood test (FOBT) of the stool. You may have this test every year starting at age 78.  Flexible sigmoidoscopy or colonoscopy. You may have a sigmoidoscopy every 5 years or a colonoscopy every 10 years starting at age 78.  Prostate cancer screening. Recommendations will vary depending on your family history and other  risks.  Hepatitis C blood test.  Hepatitis B blood test.  Sexually transmitted disease (STD) testing.  Diabetes screening. This is done by checking your  blood sugar (glucose) after you have not eaten for a while (fasting). You may have this done every 1-3 years.  Abdominal aortic aneurysm (AAA) screening. You may need this if you are a current or former smoker.  Osteoporosis. You may be screened starting at age 78 if you are at high risk. Talk with your health care provider about your test results, treatment options, and if necessary, the need for more tests. Vaccines  Your health care provider may recommend certain vaccines, such as:  Influenza vaccine. This is recommended every year.  Tetanus, diphtheria, and acellular pertussis (Tdap, Td) vaccine. You may need a Td booster every 10 years.  Zoster vaccine. You may need this after age 75.  Pneumococcal 13-valent conjugate (PCV13) vaccine. One dose is recommended after age 31.  Pneumococcal polysaccharide (PPSV23) vaccine. One dose is recommended after age 27. Talk to your health care provider about which screenings and vaccines you need and how often you need them. This information is not intended to replace advice given to you by your health care provider. Make sure you discuss any questions you have with your health care provider. Document Released: 06/09/2015 Document Revised: 01/31/2016 Document Reviewed: 03/14/2015 Elsevier Interactive Patient Education  2017 [place] Prevention in the Home Falls can cause injuries. They can happen to people of all ages. There are many things you can do to make your home safe and to help prevent falls. What can I do on the outside of my home?  Regularly fix the edges of walkways and driveways and fix any cracks.  Remove anything that might make you trip as you walk through a door, such as a raised step or threshold.  Trim any bushes or trees on the path to your home.  Use bright outdoor lighting.  Clear any walking paths of anything that might make someone trip, such as rocks or tools.  Regularly check to see if handrails are  loose or broken. Make sure that both sides of any steps have handrails.  Any raised decks and porches should have guardrails on the edges.  Have any leaves, snow, or ice cleared regularly.  Use sand or salt on walking paths during winter.  Clean up any spills in your garage right away. This includes oil or grease spills. What can I do in the bathroom?  Use night lights.  Install grab bars by the toilet and in the tub and shower. Do not use towel bars as grab bars.  Use non-skid mats or decals in the tub or shower.  If you need to sit down in the shower, use a plastic, non-slip stool.  Keep the floor dry. Clean up any water that spills on the floor as soon as it happens.  Remove soap buildup in the tub or shower regularly.  Attach bath mats securely with double-sided non-slip rug tape.  Do not have throw rugs and other things on the floor that can make you trip. What can I do in the bedroom?  Use night lights.  Make sure that you have a light by your bed that is easy to reach.  Do not use any sheets or blankets that are too big for your bed. They should not hang down onto the floor.  Have a firm chair that has side arms. You can use this for support  while you get dressed.  Do not have throw rugs and other things on the floor that can make you trip. What can I do in the kitchen?  Clean up any spills right away.  Avoid walking on wet floors.  Keep items that you use a lot in easy-to-reach places.  If you need to reach something above you, use a strong step stool that has a grab bar.  Keep electrical cords out of the way.  Do not use floor polish or wax that makes floors slippery. If you must use wax, use non-skid floor wax.  Do not have throw rugs and other things on the floor that can make you trip. What can I do with my stairs?  Do not leave any items on the stairs.  Make sure that there are handrails on both sides of the stairs and use them. Fix handrails that  are broken or loose. Make sure that handrails are as long as the stairways.  Check any carpeting to make sure that it is firmly attached to the stairs. Fix any carpet that is loose or worn.  Avoid having throw rugs at the top or bottom of the stairs. If you do have throw rugs, attach them to the floor with carpet tape.  Make sure that you have a light switch at the top of the stairs and the bottom of the stairs. If you do not have them, ask someone to add them for you. What else can I do to help prevent falls?  Wear shoes that:  Do not have high heels.  Have rubber bottoms.  Are comfortable and fit you well.  Are closed at the toe. Do not wear sandals.  If you use a stepladder:  Make sure that it is fully opened. Do not climb a closed stepladder.  Make sure that both sides of the stepladder are locked into place.  Ask someone to hold it for you, if possible.  Clearly mark and make sure that you can see:  Any grab bars or handrails.  First and last steps.  Where the edge of each step is.  Use tools that help you move around (mobility aids) if they are needed. These include:  Canes.  Walkers.  Scooters.  Crutches.  Turn on the lights when you go into a dark area. Replace any light bulbs as soon as they burn out.  Set up your furniture so you have a clear path. Avoid moving your furniture around.  If any of your floors are uneven, fix them.  If there are any pets around you, be aware of where they are.  Review your medicines with your doctor. Some medicines can make you feel dizzy. This can increase your chance of falling. Ask your doctor what other things that you can do to help prevent falls. This information is not intended to replace advice given to you by your health care provider. Make sure you discuss any questions you have with your health care provider. Document Released: 03/09/2009 Document Revised: 10/19/2015 Document Reviewed: 06/17/2014 Elsevier  Interactive Patient Education  2017 [employer].

## 2020-04-03 DIAGNOSIS — H52203 Unspecified astigmatism, bilateral: Secondary | ICD-10-CM | POA: Diagnosis not present

## 2020-04-03 DIAGNOSIS — H25813 Combined forms of age-related cataract, bilateral: Secondary | ICD-10-CM | POA: Diagnosis not present

## 2020-04-03 DIAGNOSIS — H43813 Vitreous degeneration, bilateral: Secondary | ICD-10-CM | POA: Diagnosis not present

## 2020-04-03 DIAGNOSIS — H5213 Myopia, bilateral: Secondary | ICD-10-CM | POA: Diagnosis not present

## 2020-04-04 DIAGNOSIS — I129 Hypertensive chronic kidney disease with stage 1 through stage 4 chronic kidney disease, or unspecified chronic kidney disease: Secondary | ICD-10-CM | POA: Diagnosis not present

## 2020-04-04 DIAGNOSIS — N1832 Chronic kidney disease, stage 3b: Secondary | ICD-10-CM | POA: Diagnosis not present

## 2020-04-06 ENCOUNTER — Other Ambulatory Visit: Payer: Self-pay

## 2020-04-06 ENCOUNTER — Encounter (INDEPENDENT_AMBULATORY_CARE_PROVIDER_SITE_OTHER): Payer: Self-pay | Admitting: Nurse Practitioner

## 2020-04-06 ENCOUNTER — Ambulatory Visit (INDEPENDENT_AMBULATORY_CARE_PROVIDER_SITE_OTHER): Payer: Medicare Other

## 2020-04-06 ENCOUNTER — Ambulatory Visit (INDEPENDENT_AMBULATORY_CARE_PROVIDER_SITE_OTHER): Payer: Medicare Other | Admitting: Nurse Practitioner

## 2020-04-06 VITALS — BP 147/76 | HR 82 | Resp 16 | Wt 252.0 lb

## 2020-04-06 DIAGNOSIS — M79605 Pain in left leg: Secondary | ICD-10-CM | POA: Diagnosis not present

## 2020-04-06 DIAGNOSIS — I739 Peripheral vascular disease, unspecified: Secondary | ICD-10-CM

## 2020-04-06 DIAGNOSIS — M79604 Pain in right leg: Secondary | ICD-10-CM | POA: Diagnosis not present

## 2020-04-06 DIAGNOSIS — R0989 Other specified symptoms and signs involving the circulatory and respiratory systems: Secondary | ICD-10-CM

## 2020-04-07 ENCOUNTER — Telehealth (INDEPENDENT_AMBULATORY_CARE_PROVIDER_SITE_OTHER): Payer: Self-pay

## 2020-04-07 NOTE — Telephone Encounter (Signed)
Spoke with the patient and he does not want to schedule his angio until after 06/20/20 so he will call back to do so at that time.

## 2020-04-13 ENCOUNTER — Encounter (INDEPENDENT_AMBULATORY_CARE_PROVIDER_SITE_OTHER): Payer: Self-pay | Admitting: Nurse Practitioner

## 2020-04-22 ENCOUNTER — Other Ambulatory Visit: Payer: Self-pay | Admitting: Internal Medicine
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
## 2020-05-19 ENCOUNTER — Other Ambulatory Visit: Payer: Self-pay | Admitting: Internal Medicine

## 2020-05-19 NOTE — Telephone Encounter (Signed)
Please refill as per office routine med refill policy (all routine meds refilled for 3 mo or monthly per pt preference up to one year from last visit, then month to month grace period for 3 mo, then further med refills will have to be denied)  

## 2020-05-25 DIAGNOSIS — Z23 Encounter for immunization: Secondary | ICD-10-CM | POA: Diagnosis not present

## 2020-05-29 DIAGNOSIS — Z20822 Contact with and (suspected) exposure to covid-19: Secondary | ICD-10-CM | POA: Diagnosis not present

## 2020-06-11 ENCOUNTER — Other Ambulatory Visit: Payer: Self-pay | Admitting: Internal Medicine

## 2020-06-22 NOTE — Telephone Encounter (Signed)
Patient called back and is now scheduled with Dr. Delana Meyer for a RLE angio on 07/04/20 with a 10:00 am arrival to the [facility]. Covid testing on 06/30/20 between 8-1 pm at the [facility]. Pre-procedure instructions were discussed and will be mailed.

## 2020-06-30 ENCOUNTER — Other Ambulatory Visit: Payer: Self-pay

## 2020-06-30 ENCOUNTER — Other Ambulatory Visit
Admission: RE | Admit: 2020-06-30 | Discharge: 2020-06-30 | Disposition: A | Discharge status: Home / Self Care | Payer: Medicare Other | Source: Ambulatory Visit | Attending: Vascular Surgery | Admitting: Vascular Surgery

## 2020-06-30 DIAGNOSIS — Z20822 Contact with and (suspected) exposure to covid-19: Secondary | ICD-10-CM | POA: Diagnosis not present

## 2020-06-30 DIAGNOSIS — Z01812 Encounter for preprocedural laboratory examination: Secondary | ICD-10-CM | POA: Insufficient documentation

## 2020-06-30 LAB — SARS CORONAVIRUS 2 (TAT 6-24 HRS): SARS Coronavirus 2: NEGATIVE

## 2020-07-03 ENCOUNTER — Other Ambulatory Visit (INDEPENDENT_AMBULATORY_CARE_PROVIDER_SITE_OTHER): Payer: Self-pay | Admitting: Nurse Practitioner

## 2020-07-04 ENCOUNTER — Encounter: Payer: Self-pay | Admitting: Vascular Surgery

## 2020-07-04 ENCOUNTER — Other Ambulatory Visit: Payer: Self-pay

## 2020-07-04 ENCOUNTER — Encounter
Admission: RE | Disposition: A | Discharge status: Home / Self Care | Payer: Self-pay | Source: Home / Self Care | Attending: Vascular Surgery

## 2020-07-04 ENCOUNTER — Ambulatory Visit
Admission: RE | Admit: 2020-07-04 | Discharge: 2020-07-04 | Disposition: A | Discharge status: Home / Self Care | Payer: Medicare Other | Attending: Vascular Surgery | Admitting: Vascular Surgery

## 2020-07-04 DIAGNOSIS — E785 Hyperlipidemia, unspecified: Secondary | ICD-10-CM | POA: Diagnosis not present

## 2020-07-04 DIAGNOSIS — I2510 Atherosclerotic heart disease of native coronary artery without angina pectoris: Secondary | ICD-10-CM | POA: Insufficient documentation

## 2020-07-04 DIAGNOSIS — I70211 Atherosclerosis of native arteries of extremities with intermittent claudication, right leg: Secondary | ICD-10-CM | POA: Insufficient documentation

## 2020-07-04 DIAGNOSIS — I10 Essential (primary) hypertension: Secondary | ICD-10-CM | POA: Diagnosis not present

## 2020-07-04 DIAGNOSIS — Z87891 Personal history of nicotine dependence: Secondary | ICD-10-CM | POA: Diagnosis not present

## 2020-07-04 DIAGNOSIS — I70219 Atherosclerosis of native arteries of extremities with intermittent claudication, unspecified extremity: Secondary | ICD-10-CM | POA: Diagnosis not present

## 2020-07-04 DIAGNOSIS — Z880 Allergy status to penicillin: Secondary | ICD-10-CM | POA: Insufficient documentation

## 2020-07-04 HISTORY — PX: LOWER EXTREMITY ANGIOGRAPHY: CATH118251

## 2020-07-04 LAB — BUN: BUN: 16 mg/dL (ref 8–23)

## 2020-07-04 LAB — CREATININE, SERUM
Creatinine, Ser: 1.44 mg/dL — ABNORMAL HIGH (ref 0.61–1.24)
GFR, Estimated: 50 mL/min — ABNORMAL LOW (ref >60)

## 2020-07-04 SURGERY — LOWER EXTREMITY ANGIOGRAPHY
Anesthesia: Moderate Sedation | Site: Leg Lower | Laterality: Right

## 2020-07-04 MED ORDER — MIDAZOLAM HCL 2 MG/ML PO SYRP: 8.0000 mg | ORAL_SOLUTION | Freq: Once | ORAL | Status: DC | PRN

## 2020-07-04 MED ORDER — MIDAZOLAM HCL 2 MG/2ML IJ SOLN
INTRAMUSCULAR | Status: AC
  Filled 2020-07-04: qty 2

## 2020-07-04 MED ORDER — SODIUM CHLORIDE 0.9 % IV BOLUS: 250.0000 mL | Freq: Once | INTRAVENOUS | Status: DC

## 2020-07-04 MED ORDER — METHYLPREDNISOLONE SODIUM SUCC 125 MG IJ SOLR: 125.0000 mg | Freq: Once | INTRAMUSCULAR | Status: DC | PRN

## 2020-07-04 MED ORDER — SODIUM CHLORIDE 0.9% FLUSH: 3.0000 mL | INTRAVENOUS | Status: DC | PRN

## 2020-07-04 MED ORDER — ACETAMINOPHEN 325 MG PO TABS: 650.0000 mg | ORAL_TABLET | ORAL | Status: DC | PRN

## 2020-07-04 MED ORDER — FENTANYL CITRATE (PF) 100 MCG/2ML IJ SOLN
INTRAMUSCULAR | Status: AC
  Filled 2020-07-04: qty 2

## 2020-07-04 MED ORDER — CLINDAMYCIN PHOSPHATE 300 MG/50ML IV SOLN
INTRAVENOUS | Status: AC
  Filled 2020-07-04: qty 50

## 2020-07-04 MED ORDER — CLINDAMYCIN PHOSPHATE 300 MG/50ML IV SOLN: 300.0000 mg | Freq: Once | INTRAVENOUS | Status: AC

## 2020-07-04 MED ORDER — IODIXANOL 320 MG/ML IV SOLN
INTRAVENOUS | Status: DC | PRN
  Administered 2020-07-04: 55 mL

## 2020-07-04 MED ORDER — HYDROMORPHONE HCL 1 MG/ML IJ SOLN: 1.0000 mg | Freq: Once | INTRAMUSCULAR | Status: DC | PRN

## 2020-07-04 MED ORDER — CLOPIDOGREL BISULFATE 300 MG PO TABS
300.0000 mg | ORAL_TABLET | ORAL | Status: AC
  Administered 2020-07-04: 300 mg via ORAL

## 2020-07-04 MED ORDER — DIPHENHYDRAMINE HCL 50 MG/ML IJ SOLN: 50.0000 mg | Freq: Once | INTRAMUSCULAR | Status: DC | PRN

## 2020-07-04 MED ORDER — FENTANYL CITRATE (PF) 100 MCG/2ML IJ SOLN
INTRAMUSCULAR | Status: DC | PRN
  Administered 2020-07-04 (×2): 50 ug via INTRAVENOUS

## 2020-07-04 MED ORDER — ONDANSETRON HCL 4 MG/2ML IJ SOLN: 4.0000 mg | Freq: Four times a day (QID) | INTRAMUSCULAR | Status: DC | PRN

## 2020-07-04 MED ORDER — SODIUM CHLORIDE 0.9 % IV SOLN
INTRAVENOUS | Status: DC
  Administered 2020-07-04: 1000 mL via INTRAVENOUS

## 2020-07-04 MED ORDER — MORPHINE SULFATE (PF) 4 MG/ML IV SOLN: 2.0000 mg | INTRAVENOUS | Status: DC | PRN

## 2020-07-04 MED ORDER — SODIUM CHLORIDE 0.9 % IV SOLN: 250.0000 mL | INTRAVENOUS | Status: DC | PRN

## 2020-07-04 MED ORDER — FAMOTIDINE 20 MG PO TABS: 40.0000 mg | ORAL_TABLET | Freq: Once | ORAL | Status: DC | PRN

## 2020-07-04 MED ORDER — HEPARIN SODIUM (PORCINE) 1000 UNIT/ML IJ SOLN
INTRAMUSCULAR | Status: DC | PRN
  Administered 2020-07-04: 5000 [IU] via INTRAVENOUS

## 2020-07-04 MED ORDER — CLOPIDOGREL BISULFATE 75 MG PO TABS
ORAL_TABLET | ORAL | Status: AC
  Filled 2020-07-04: qty 4

## 2020-07-04 MED ORDER — HYDRALAZINE HCL 20 MG/ML IJ SOLN
5.0000 mg | INTRAMUSCULAR | Status: DC | PRN
Start: 2020-07-04 — End: 2020-07-04

## 2020-07-04 MED ORDER — LABETALOL HCL 5 MG/ML IV SOLN: 10.0000 mg | INTRAVENOUS | Status: DC | PRN

## 2020-07-04 MED ORDER — OXYCODONE HCL 5 MG PO TABS: 5.0000 mg | ORAL_TABLET | ORAL | Status: DC | PRN

## 2020-07-04 MED ORDER — HEPARIN SODIUM (PORCINE) 1000 UNIT/ML IJ SOLN
INTRAMUSCULAR | Status: AC
  Filled 2020-07-04: qty 1

## 2020-07-04 MED ORDER — SODIUM CHLORIDE 0.9 % IV SOLN: INTRAVENOUS | Status: DC

## 2020-07-04 MED ORDER — CLINDAMYCIN PHOSPHATE 300 MG/50ML IV SOLN
INTRAVENOUS | Status: AC
  Administered 2020-07-04: 300 mg via INTRAVENOUS
  Filled 2020-07-04: qty 50

## 2020-07-04 MED ORDER — SODIUM CHLORIDE 0.9% FLUSH: 3.0000 mL | Freq: Two times a day (BID) | INTRAVENOUS | Status: DC

## 2020-07-04 MED ORDER — CLOPIDOGREL BISULFATE 75 MG PO TABS: 75.0000 mg | ORAL_TABLET | Freq: Every day | ORAL | 5 refills | Status: DC

## 2020-07-04 MED ORDER — MIDAZOLAM HCL 2 MG/2ML IJ SOLN
INTRAMUSCULAR | Status: DC | PRN
  Administered 2020-07-04: 1 mg via INTRAVENOUS
  Administered 2020-07-04: 2 mg via INTRAVENOUS

## 2020-07-04 SURGICAL SUPPLY — 12 items
BALLN LUTONIX DCB 5X100X130 (BALLOONS) ×2
BALLOON LUTONIX DCB 5X100X130 (BALLOONS) ×1 IMPLANT
CATH ANGIO 5F PIGTAIL 65CM (CATHETERS) ×2 IMPLANT
DEVICE STARCLOSE SE CLOSURE (Vascular Products) ×2 IMPLANT
GLIDEWIRE ADV .035X260CM (WIRE) ×2 IMPLANT
KIT ENCORE 26 ADVANTAGE (KITS) ×2 IMPLANT
NEEDLE ENTRY 21GA 7CM ECHOTIP (NEEDLE) ×2 IMPLANT
PACK ANGIOGRAPHY (CUSTOM PROCEDURE TRAY) ×2 IMPLANT
SET INTRO CAPELLA COAXIAL (SET/KITS/TRAYS/PACK) ×2 IMPLANT
SHEATH ANL2 6FRX45 HC (SHEATH) ×2 IMPLANT
SHEATH BRITE TIP 5FRX11 (SHEATH) ×2 IMPLANT
WIRE GUIDERIGHT .035X150 (WIRE) ×2 IMPLANT
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
## 2020-07-04 NOTE — Op Note (Signed)
[facility] Percutaneous Study/Intervention Procedural Note   Date of Surgery: 07/04/2020  Surgeon:  Katha Cabal, MD.  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 Introduction catheter into right lower extremity 3rd order catheter placement  2. Contrast injection right lower extremity for distal runoff   3. Percutaneous transluminal angioplasty right superficial femoral artery 4. Star close closure left common femoral arteriotomy  Anesthesia: Conscious sedation was administered under my direct supervision by the interventional radiology RN. IV Versed plus fentanyl were utilized. Continuous ECG, pulse oximetry and blood pressure was monitored throughout the entire procedure.  Conscious sedation was for a total of 43 minutes and 55 seconds.  Sheath: 6 French Raby retrograde left common femoral  Contrast: 55 cc  Fluoroscopy Time: 4.2 minutes  Indications: Alex Kemp presents with increasing claudication symptoms of the right lower extremity.  He now stresses that this is limiting his daily activities.  The risks and benefits are reviewed all questions answered patient agrees to proceed.  Procedure: Alex Kemp is a 80 y.o. y.o. male who was identified and appropriate procedural time out was performed. The patient was then placed supine on the table and prepped and draped in the usual sterile fashion.   Ultrasound was placed in the sterile sleeve and the left groin was evaluated the left common femoral artery was echolucent and pulsatile indicating patency.  Image was recorded for the permanent record and under real-time visualization a microneedle was inserted into the common femoral artery microwire followed by a micro-sheath.  A J-wire  was then advanced through the micro-sheath and a  5 [place] sheath was then inserted over a J-wire. J-wire was then advanced and a 5 French pigtail catheter was positioned at the level of T12. AP projection of the aorta was then obtained. Pigtail catheter was repositioned to above the bifurcation and a LAO view of the pelvis was obtained.  Subsequently a pigtail catheter with the stiff angle Glidewire was used to cross the aortic bifurcation the catheter wire were advanced down into the right distal external iliac artery. Oblique view of the femoral bifurcation was then obtained and subsequently the wire was reintroduced and the pigtail catheter negotiated into the SFA representing third order catheter placement. Distal runoff was then performed.  Diagnostic interpretation: The abdominal aorta is opacified with a bolus injection of contrast.  There are no hemodynamically significant stenosis.  There appears to be minimal atherosclerotic changes.  There is poor filling of the renal arteries.  The bilateral common internal and external iliac arteries are widely patent with minimal atherosclerotic changes.  The right common femoral profunda femoris is widely patent with mild atherosclerotic changes.  There is increasing atherosclerosis of the right superficial femoral with tandem lesions in its midportion of greater than 80%.  The diameter normalizes at the level of Hunter's canal in the distal SFA and popliteal are diffusely diseased but widely patent.  Trifurcation is widely patent with posterior tibial and anterior tibial filling quite nicely.  The peroneal appears to be small and diffusely diseased.  At the level of the ankle the posterior tibial appears to be the dominant runoff to the foot.  Filling of the distal anterior tibial is well visualized but slightly delayed compared to the posterior tibial.  Interestingly, there is essentially no filling of the pedal arch and forefoot.  5000 units of heparin was  then given  and allowed to circulate and a 5000 sheath was advanced up and over the bifurcation and positioned in the femoral artery  [name]  catheter and stiff angle Glidewire were then negotiated down into the distal popliteal.  Distal runoff was then completed by hand injection through the catheter. The wire was then reintroduced and a 5 mm by 100 mm balloon was used to angioplasty the superficial femoral. Inflations were to 8 atmospheres for 1 minute. Follow-up imaging demonstrated patency with less than 5 % residual stenosis. Distal runoff was then reassessed and found to be unchanged.  After review of these images the sheath is pulled into the left external iliac oblique of the common femoral is obtained and a Star close device deployed. There no immediate complications.   Findings:  The abdominal aorta is opacified with a bolus injection of contrast.  There are no hemodynamically significant stenosis.  There appears to be minimal atherosclerotic changes.  There is poor filling of the renal arteries.  The bilateral common internal and external iliac arteries are widely patent with minimal atherosclerotic changes.  The right common femoral profunda femoris is widely patent with mild atherosclerotic changes.  There is increasing atherosclerosis of the right superficial femoral with tandem lesions in its midportion of greater than 80%.  The diameter normalizes at the level of Hunter's canal in the distal SFA and popliteal are diffusely diseased but widely patent.  Trifurcation is widely patent with posterior tibial and anterior tibial filling quite nicely.  The peroneal appears to be small and diffusely diseased.  At the level of the ankle the posterior tibial appears to be the dominant runoff to the foot.  Filling of the distal anterior tibial is well visualized but slightly delayed compared to the posterior tibial.  Interestingly, there is essentially no filling of the pedal arch and  forefoot.  Following angioplasty to 5 mm the right SFA is now patent with in-line flow and looks quite nice with less than 5% residual stenosis.     Summary: Successful recanalization right lower extremity for limb salvage    Disposition: Patient was taken to the recovery room in stable condition having tolerated the procedure well.  Alex Kemp, Alex Kemp 07/04/2020,11:53 AM

## 2020-07-04 NOTE — Interval H&P Note (Signed)
History and Physical Interval Note:  07/04/2020 10:50 AM  Alex Kemp  has presented today for surgery, with the diagnosis of RT Lower Extremity Angiography   ASO with claudication   BARD Rep cc: M Godley, S Willey   Pt to have Covid test on 2-4.  The various methods of treatment have been discussed with the patient and family. After consideration of risks, benefits and other options for treatment, the patient has consented to  Procedure(s): LOWER EXTREMITY ANGIOGRAPHY (Right) as a surgical intervention.  The patient's history has been reviewed, patient examined, no change in status, stable for surgery.  I have reviewed the patient's chart and labs.  Questions were answered to the patient's satisfaction.     [name]
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
## 2020-07-06 ENCOUNTER — Telehealth (INDEPENDENT_AMBULATORY_CARE_PROVIDER_SITE_OTHER): Payer: Self-pay | Admitting: Vascular Surgery

## 2020-07-06 ENCOUNTER — Other Ambulatory Visit: Payer: Self-pay

## 2020-07-06 ENCOUNTER — Other Ambulatory Visit: Payer: Medicare Other

## 2020-07-06 DIAGNOSIS — I10 Essential (primary) hypertension: Secondary | ICD-10-CM

## 2020-07-06 DIAGNOSIS — R739 Hyperglycemia, unspecified: Secondary | ICD-10-CM | POA: Diagnosis not present

## 2020-07-06 DIAGNOSIS — E785 Hyperlipidemia, unspecified: Secondary | ICD-10-CM

## 2020-07-06 DIAGNOSIS — E291 Testicular hypofunction: Secondary | ICD-10-CM

## 2020-07-06 DIAGNOSIS — R972 Elevated prostate specific antigen [PSA]: Secondary | ICD-10-CM | POA: Diagnosis not present

## 2020-07-06 DIAGNOSIS — N183 Chronic kidney disease, stage 3 unspecified: Secondary | ICD-10-CM | POA: Diagnosis not present

## 2020-07-06 DIAGNOSIS — R7303 Prediabetes: Secondary | ICD-10-CM

## 2020-07-06 LAB — COMPLETE METABOLIC PANEL WITH GFR
AG Ratio: 1.5 (calc) (ref 1.0–2.5)
ALT: 21 U/L (ref 9–46)
AST: 15 U/L (ref 10–35)
Albumin: 4 g/dL (ref 3.6–5.1)
Alkaline phosphatase (APISO): 131 U/L (ref 35–144)
BUN/Creatinine Ratio: 11 (calc) (ref 6–22)
BUN: 18 mg/dL (ref 7–25)
CO2: 24 mmol/L (ref 20–32)
Calcium: 10 mg/dL (ref 8.6–10.3)
Chloride: 104 mmol/L (ref 98–110)
Creat: 1.67 mg/dL — ABNORMAL HIGH (ref 0.70–1.18)
GFR, Est African American: 45 mL/min/{1.73_m2} — ABNORMAL LOW (ref >=60)
GFR, Est Non African American: 39 mL/min/{1.73_m2} — ABNORMAL LOW (ref >=60)
Globulin: 2.7 g/dL (calc) (ref 1.9–3.7)
Glucose, Bld: 138 mg/dL — ABNORMAL HIGH (ref 65–99)
Potassium: 4.7 mmol/L (ref 3.5–5.3)
Sodium: 138 mmol/L (ref 135–146)
Total Bilirubin: 0.6 mg/dL (ref 0.2–1.2)
Total Protein: 6.7 g/dL (ref 6.1–8.1)

## 2020-07-06 NOTE — Telephone Encounter (Signed)
Called and talked with patient directly.  I suspect it may the contrast dye causing issues.  He notes he's having extended indigestion. Patient is advised to drink plenty of fluids to flush dye from system.  He is advised to take a full aspirin (325 mg) instead for a week and hold the plavix to see if symptoms resolve.  He can restart plavix in one week and if symptoms continue he should contact us.  Otherwise, if chest pain continues and worsens he should seek emergent attention.

## 2020-07-06 NOTE — Telephone Encounter (Signed)
Called stating that he has been taking clopidogrel (PLAVIX) 75 MG tablet since Tuesday after procedure (07/04/20 [facility]). Patient states that the medication is making his stomach upset, he is having chest pain when he takes deep breaths. Patient would like to know if he can take anything else. Patient was last seen in clinic11/11/21 with carotid studies( fb) . Please advise.

## 2020-07-08 ENCOUNTER — Encounter: Payer: Self-pay | Admitting: Internal Medicine

## 2020-07-11 ENCOUNTER — Ambulatory Visit (INDEPENDENT_AMBULATORY_CARE_PROVIDER_SITE_OTHER): Payer: Medicare Other | Admitting: Internal Medicine

## 2020-07-11 ENCOUNTER — Encounter: Payer: Self-pay | Admitting: Internal Medicine

## 2020-07-11 ENCOUNTER — Other Ambulatory Visit: Payer: Self-pay

## 2020-07-11 VITALS — BP 142/74 | HR 71 | Temp 97.5°F | Ht 72.0 in | Wt 255.0 lb

## 2020-07-11 DIAGNOSIS — R739 Hyperglycemia, unspecified: Secondary | ICD-10-CM | POA: Diagnosis not present

## 2020-07-11 DIAGNOSIS — I10 Essential (primary) hypertension: Secondary | ICD-10-CM

## 2020-07-11 DIAGNOSIS — I70219 Atherosclerosis of native arteries of extremities with intermittent claudication, unspecified extremity: Secondary | ICD-10-CM | POA: Diagnosis not present

## 2020-07-11 DIAGNOSIS — I739 Peripheral vascular disease, unspecified: Secondary | ICD-10-CM

## 2020-07-11 DIAGNOSIS — E782 Mixed hyperlipidemia: Secondary | ICD-10-CM | POA: Diagnosis not present

## 2020-07-11 MED ORDER — ROSUVASTATIN CALCIUM 40 MG PO TABS: 40.0000 mg | ORAL_TABLET | Freq: Every day | ORAL | 3 refills | Status: DC

## 2020-07-11 MED ORDER — AMLODIPINE BESYLATE 5 MG PO TABS: 5.0000 mg | ORAL_TABLET | Freq: Every day | ORAL | 3 refills | Status: DC
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
## 2020-07-11 NOTE — Patient Instructions (Signed)
Please take all new medication as prescribed - the amlodipine 5 mg per day  Ok to change the simvastatin to crestor 40 mg per day  Please continue all other medications as before, and refills have been done if requested.  Please have the pharmacy call with any other refills you may need.  Please continue your efforts at being more active, low cholesterol diet, and weight control.  Please keep your appointments with your specialists as you may have planned  Please make an Appointment to return in 6 months, or sooner if needed, also with Lab Appointment for testing done 3-5 days before at the [facility] (so this is for TWO appointments - please see the scheduling desk as you leave)  Due to the ongoing Covid 19 pandemic, our lab now requires an appointment for any labs done at our office.  If you need labs done and do not have an appointment, please call our office ahead of time to schedule before presenting to the lab for your testing.

## 2020-07-16 ENCOUNTER — Encounter: Payer: Self-pay | Admitting: Internal Medicine

## 2020-07-16 NOTE — Assessment & Plan Note (Signed)
Lab Results  Component Value Date   HGBA1C 5.7 01/06/2020   Stable, pt to continue current medical treatment  - diet

## 2020-07-16 NOTE — Assessment & Plan Note (Signed)
Stable overall,  to f/u any worsening symptoms or concerns, cont current tx

## 2020-07-16 NOTE — Assessment & Plan Note (Signed)
Goal < 70 LDL, for change simvastatin to crestor 40, low chol diet,  to f/u any worsening symptoms or concerns

## 2020-07-16 NOTE — Assessment & Plan Note (Signed)
BP Readings from Last 3 Encounters:  07/11/20 (!) 142/74  07/04/20 (!) 149/77  04/06/20 (!) 147/76   Uncontrolled,, pt to continue medical treatment except add amlodipine 5 qd  Current Outpatient Medications (Endocrine & Metabolic):  .  testosterone (ANDROGEL) 50 MG/5GM (1%) GEL, APPLY THE CONTENTS OF 2    PACKETS ONTO THE SKIN DAILY (Patient taking differently: Place 10 g onto the skin daily. APPLY THE CONTENTS OF 2    PACKETS ONTO THE SKIN DAILY)  Current Outpatient Medications (Cardiovascular):  .  amLODipine (NORVASC) 5 MG tablet, Take 1 tablet (5 mg total) by mouth daily. [name]  losartan (COZAAR) 50 MG tablet, TAKE 1 TABLET DAILY (Patient taking differently: Take 50 mg by mouth daily.) .  rosuvastatin (CRESTOR) 40 MG tablet, Take 1 tablet (40 mg total) by mouth daily.  Current Outpatient Medications (Respiratory):  .  cetirizine (ZYRTEC) 10 MG tablet, Take 10 mg by mouth daily.  Current Outpatient Medications (Analgesics):  .  allopurinol (ZYLOPRIM) 100 MG tablet, Take 1 tablet (100 mg total) by mouth daily. [name]  aspirin EC 81 MG tablet, Take 81 mg by mouth daily with lunch.  Current Outpatient Medications (Hematological):  .  clopidogrel (PLAVIX) 75 MG tablet, Take 1 tablet (75 mg total) by mouth daily. .  iron polysaccharides (NIFEREX) 150 MG capsule, TAKE 1 CAPSULE BY MOUTH EVERY DAY (Patient taking differently: Take 150 mg by mouth daily. TAKE 1 CAPSULE BY MOUTH EVERY DAY)  Current Outpatient Medications (Other):  .  alfuzosin (UROXATRAL) 10 MG 24 hr tablet, Take 10 mg by mouth daily. [name]  b complex vitamins tablet, Take 1 tablet by mouth daily with lunch. .  Cholecalciferol (VITAMIN D) 50 MCG (2000 UT) tablet, Take 2,000 Units by mouth daily. .  Coenzyme Q10 (COQ10 PO), Take 1 tablet by mouth daily with lunch. .  diphenoxylate-atropine (LOMOTIL) 2.5-0.025 MG tablet, 1 tab by mouth every 6 hrs as needed (Patient taking differently: Take 1 tablet by mouth every 6 (six) hours as needed  for diarrhea or loose stools. 1 tab by mouth every 6 hrs as needed) .  finasteride (PROSCAR) 5 MG tablet, Take 5 mg by mouth daily. .  Glucosamine HCl (GLUCOSAMINE PO), Take 1 tablet by mouth daily with lunch. .  Multiple Vitamin (MULTIVITAMIN WITH MINERALS) TABS tablet, Take 1 tablet by mouth daily with lunch. .  Omega-3 Fatty Acids (FISH OIL PO), Take 1 capsule by mouth daily with lunch. .  pantoprazole (PROTONIX) 40 MG tablet, Take 1 tablet (40 mg total) by mouth 2 (two) times daily. .  Polyvinyl Alcohol-Povidone (REFRESH OP), Place 1 drop into both eyes 2 (two) times daily. [name]  rOPINIRole (REQUIP) 1 MG tablet, TAKE 1 TABLET AT BEDTIME (Patient taking differently: Take 1 mg by mouth at bedtime.) .  tamsulosin (FLOMAX) 0.4 MG CAPS capsule, Take 1 capsule (0.4 mg total) by mouth daily. .  temazepam (RESTORIL) 30 MG capsule, TAKE 1 CAPSULE AT BEDTIME  AS NEEDED FOR SLEEP (Patient taking differently: Take 30 mg by mouth at bedtime as needed for sleep. TAKE 1 CAPSULE AT BEDTIME  AS NEEDED FOR SLEEP) .  tolterodine (DETROL LA) 4 MG 24 hr capsule, TAKE 1 CAPSULE DAILY AT 12 NOON (Patient taking differently: Take 4 mg by mouth daily.) .  Vitamin D, Ergocalciferol, (DRISDOL) 1.25 MG (50000 UNIT) CAPS capsule, Take 1 capsule (50,000 Units total) by mouth every 7 (seven) days. (Patient not taking: Reported on 06/22/2020)

## 2020-07-21 ENCOUNTER — Other Ambulatory Visit (INDEPENDENT_AMBULATORY_CARE_PROVIDER_SITE_OTHER): Payer: Self-pay | Admitting: Vascular Surgery

## 2020-07-21 DIAGNOSIS — I70211 Atherosclerosis of native arteries of extremities with intermittent claudication, right leg: Secondary | ICD-10-CM

## 2020-07-21 DIAGNOSIS — Z9862 Peripheral vascular angioplasty status: Secondary | ICD-10-CM

## 2020-07-24 ENCOUNTER — Ambulatory Visit (INDEPENDENT_AMBULATORY_CARE_PROVIDER_SITE_OTHER): Payer: Medicare Other | Admitting: Vascular Surgery

## 2020-07-24 ENCOUNTER — Ambulatory Visit (INDEPENDENT_AMBULATORY_CARE_PROVIDER_SITE_OTHER): Payer: Medicare Other

## 2020-07-24 ENCOUNTER — Other Ambulatory Visit: Payer: Self-pay

## 2020-07-24 ENCOUNTER — Encounter (INDEPENDENT_AMBULATORY_CARE_PROVIDER_SITE_OTHER): Payer: Self-pay | Admitting: Vascular Surgery

## 2020-07-24 VITALS — BP 146/80 | HR 83 | Resp 16 | Wt 256.0 lb

## 2020-07-24 DIAGNOSIS — I10 Essential (primary) hypertension: Secondary | ICD-10-CM | POA: Diagnosis not present

## 2020-07-24 DIAGNOSIS — I70219 Atherosclerosis of native arteries of extremities with intermittent claudication, unspecified extremity: Secondary | ICD-10-CM | POA: Diagnosis not present

## 2020-07-24 DIAGNOSIS — I739 Peripheral vascular disease, unspecified: Secondary | ICD-10-CM | POA: Diagnosis not present

## 2020-07-24 DIAGNOSIS — M519 Unspecified thoracic, thoracolumbar and lumbosacral intervertebral disc disorder: Secondary | ICD-10-CM

## 2020-07-24 DIAGNOSIS — I70211 Atherosclerosis of native arteries of extremities with intermittent claudication, right leg: Secondary | ICD-10-CM

## 2020-07-24 DIAGNOSIS — Z9862 Peripheral vascular angioplasty status: Secondary | ICD-10-CM | POA: Diagnosis not present

## 2020-07-24 DIAGNOSIS — E782 Mixed hyperlipidemia: Secondary | ICD-10-CM
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
## 2020-07-26 ENCOUNTER — Encounter (INDEPENDENT_AMBULATORY_CARE_PROVIDER_SITE_OTHER): Payer: Self-pay

## 2020-07-26 ENCOUNTER — Telehealth (INDEPENDENT_AMBULATORY_CARE_PROVIDER_SITE_OTHER): Payer: Self-pay

## 2020-07-26 NOTE — Telephone Encounter (Signed)
I attempted to contact the patient to schedule his left leg angio with Dr. Delana Meyer, a message was left for a return call.

## 2020-07-26 NOTE — Telephone Encounter (Signed)
Patient returned my call and is scheduled with Dr. Delana Meyer on 08/15/20 with a 9:00 am arrival time to the [facility]. Covid testing on 08/11/20 between 8-1 pm at the [facility]. Pre-procedure instructions were discussed and will be mailed.

## 2020-07-30 ENCOUNTER — Encounter (INDEPENDENT_AMBULATORY_CARE_PROVIDER_SITE_OTHER): Payer: Self-pay | Admitting: Vascular Surgery

## 2020-08-11 ENCOUNTER — Other Ambulatory Visit
Admission: RE | Admit: 2020-08-11 | Discharge: 2020-08-11 | Disposition: A | Discharge status: Home / Self Care | Payer: Medicare Other | Source: Ambulatory Visit | Attending: Vascular Surgery | Admitting: Vascular Surgery

## 2020-08-11 ENCOUNTER — Other Ambulatory Visit: Payer: Self-pay

## 2020-08-11 DIAGNOSIS — Z01812 Encounter for preprocedural laboratory examination: Secondary | ICD-10-CM | POA: Diagnosis not present

## 2020-08-11 DIAGNOSIS — Z20822 Contact with and (suspected) exposure to covid-19: Secondary | ICD-10-CM | POA: Insufficient documentation

## 2020-08-11 LAB — SARS CORONAVIRUS 2 (TAT 6-24 HRS): SARS Coronavirus 2: NEGATIVE

## 2020-08-15 ENCOUNTER — Other Ambulatory Visit (INDEPENDENT_AMBULATORY_CARE_PROVIDER_SITE_OTHER): Payer: Self-pay | Admitting: Nurse Practitioner

## 2020-08-15 ENCOUNTER — Other Ambulatory Visit: Payer: Self-pay

## 2020-08-15 ENCOUNTER — Ambulatory Visit
Admission: RE | Admit: 2020-08-15 | Discharge: 2020-08-15 | Disposition: A | Discharge status: Home / Self Care | Payer: Medicare Other | Attending: Vascular Surgery | Admitting: Vascular Surgery

## 2020-08-15 ENCOUNTER — Encounter
Admission: RE | Disposition: A | Discharge status: Home / Self Care | Payer: Self-pay | Source: Home / Self Care | Attending: Vascular Surgery

## 2020-08-15 ENCOUNTER — Encounter: Payer: Self-pay | Admitting: Vascular Surgery

## 2020-08-15 DIAGNOSIS — Z79899 Other long term (current) drug therapy: Secondary | ICD-10-CM | POA: Insufficient documentation

## 2020-08-15 DIAGNOSIS — Z7902 Long term (current) use of antithrombotics/antiplatelets: Secondary | ICD-10-CM | POA: Insufficient documentation

## 2020-08-15 DIAGNOSIS — Z87891 Personal history of nicotine dependence: Secondary | ICD-10-CM | POA: Diagnosis not present

## 2020-08-15 DIAGNOSIS — E782 Mixed hyperlipidemia: Secondary | ICD-10-CM | POA: Insufficient documentation

## 2020-08-15 DIAGNOSIS — Z880 Allergy status to penicillin: Secondary | ICD-10-CM | POA: Diagnosis not present

## 2020-08-15 DIAGNOSIS — I10 Essential (primary) hypertension: Secondary | ICD-10-CM | POA: Insufficient documentation

## 2020-08-15 DIAGNOSIS — Z7982 Long term (current) use of aspirin: Secondary | ICD-10-CM | POA: Insufficient documentation

## 2020-08-15 DIAGNOSIS — I70223 Atherosclerosis of native arteries of extremities with rest pain, bilateral legs: Secondary | ICD-10-CM | POA: Insufficient documentation

## 2020-08-15 DIAGNOSIS — I739 Peripheral vascular disease, unspecified: Secondary | ICD-10-CM

## 2020-08-15 DIAGNOSIS — I70212 Atherosclerosis of native arteries of extremities with intermittent claudication, left leg: Secondary | ICD-10-CM | POA: Diagnosis not present

## 2020-08-15 HISTORY — PX: LOWER EXTREMITY ANGIOGRAPHY: CATH118251

## 2020-08-15 LAB — CREATININE, SERUM
Creatinine, Ser: 1.57 mg/dL — ABNORMAL HIGH (ref 0.61–1.24)
GFR, Estimated: 45 mL/min — ABNORMAL LOW (ref >60)

## 2020-08-15 LAB — BUN: BUN: 18 mg/dL (ref 8–23)

## 2020-08-15 SURGERY — LOWER EXTREMITY ANGIOGRAPHY
Anesthesia: Moderate Sedation | Site: Leg Lower | Laterality: Left

## 2020-08-15 MED ORDER — IODIXANOL 320 MG/ML IV SOLN
INTRAVENOUS | Status: DC | PRN
  Administered 2020-08-15: 45 mL

## 2020-08-15 MED ORDER — HYDRALAZINE HCL 20 MG/ML IJ SOLN: 5.0000 mg | INTRAMUSCULAR | Status: DC | PRN

## 2020-08-15 MED ORDER — ONDANSETRON HCL 4 MG/2ML IJ SOLN: 4.0000 mg | Freq: Four times a day (QID) | INTRAMUSCULAR | Status: DC | PRN

## 2020-08-15 MED ORDER — METHYLPREDNISOLONE SODIUM SUCC 125 MG IJ SOLR: 125.0000 mg | Freq: Once | INTRAMUSCULAR | Status: DC | PRN

## 2020-08-15 MED ORDER — SODIUM CHLORIDE 0.9% FLUSH: 3.0000 mL | INTRAVENOUS | Status: DC | PRN

## 2020-08-15 MED ORDER — OXYCODONE HCL 5 MG PO TABS: 5.0000 mg | ORAL_TABLET | ORAL | Status: DC | PRN

## 2020-08-15 MED ORDER — CLINDAMYCIN PHOSPHATE 300 MG/50ML IV SOLN
INTRAVENOUS | Status: AC
  Administered 2020-08-15: 300 mg via INTRAVENOUS
  Filled 2020-08-15: qty 50

## 2020-08-15 MED ORDER — SODIUM CHLORIDE 0.9 % IV SOLN: 250.0000 mL | INTRAVENOUS | Status: DC | PRN

## 2020-08-15 MED ORDER — MIDAZOLAM HCL 5 MG/5ML IJ SOLN
INTRAMUSCULAR | Status: AC
  Filled 2020-08-15: qty 5

## 2020-08-15 MED ORDER — FENTANYL CITRATE (PF) 100 MCG/2ML IJ SOLN
INTRAMUSCULAR | Status: DC | PRN
  Administered 2020-08-15 (×2): 50 ug via INTRAVENOUS

## 2020-08-15 MED ORDER — HYDROMORPHONE HCL 1 MG/ML IJ SOLN
1.0000 mg | Freq: Once | INTRAMUSCULAR | Status: DC | PRN
Start: 2020-08-15 — End: 2020-08-15

## 2020-08-15 MED ORDER — DIPHENHYDRAMINE HCL 50 MG/ML IJ SOLN: 50.0000 mg | Freq: Once | INTRAMUSCULAR | Status: DC | PRN

## 2020-08-15 MED ORDER — FAMOTIDINE 20 MG PO TABS: 40.0000 mg | ORAL_TABLET | Freq: Once | ORAL | Status: DC | PRN

## 2020-08-15 MED ORDER — HEPARIN SODIUM (PORCINE) 1000 UNIT/ML IJ SOLN
INTRAMUSCULAR | Status: DC | PRN
  Administered 2020-08-15: 4000 [IU] via INTRAVENOUS

## 2020-08-15 MED ORDER — SODIUM CHLORIDE 0.9% FLUSH: 3.0000 mL | Freq: Two times a day (BID) | INTRAVENOUS | Status: DC

## 2020-08-15 MED ORDER — LABETALOL HCL 5 MG/ML IV SOLN
10.0000 mg | INTRAVENOUS | Status: DC | PRN
Start: 2020-08-15 — End: 2020-08-15

## 2020-08-15 MED ORDER — HEPARIN SODIUM (PORCINE) 1000 UNIT/ML IJ SOLN
INTRAMUSCULAR | Status: AC
  Filled 2020-08-15: qty 1

## 2020-08-15 MED ORDER — ACETAMINOPHEN 325 MG PO TABS: 650.0000 mg | ORAL_TABLET | ORAL | Status: DC | PRN

## 2020-08-15 MED ORDER — MORPHINE SULFATE (PF) 4 MG/ML IV SOLN: 2.0000 mg | INTRAVENOUS | Status: DC | PRN

## 2020-08-15 MED ORDER — SODIUM CHLORIDE 0.9 % IV SOLN: INTRAVENOUS | Status: DC

## 2020-08-15 MED ORDER — MIDAZOLAM HCL 2 MG/2ML IJ SOLN
INTRAMUSCULAR | Status: DC | PRN
  Administered 2020-08-15: 1 mg via INTRAVENOUS
  Administered 2020-08-15: 2 mg via INTRAVENOUS

## 2020-08-15 MED ORDER — FENTANYL CITRATE (PF) 100 MCG/2ML IJ SOLN
INTRAMUSCULAR | Status: AC
  Filled 2020-08-15: qty 2

## 2020-08-15 MED ORDER — MIDAZOLAM HCL 2 MG/ML PO SYRP: 8.0000 mg | ORAL_SOLUTION | Freq: Once | ORAL | Status: DC | PRN

## 2020-08-15 MED ORDER — CLINDAMYCIN PHOSPHATE 300 MG/50ML IV SOLN: 300.0000 mg | Freq: Once | INTRAVENOUS | Status: AC

## 2020-08-15 SURGICAL SUPPLY — 22 items
BALLN LUTONIX DCB 5X60X130 (BALLOONS) ×2
BALLN ULTRASCORE 5X40X130 (BALLOONS) ×2
BALLN ULTRSCOR 014 2.5X100X150 (BALLOONS) ×2
BALLOON LUTONIX DCB 5X60X130 (BALLOONS) ×1 IMPLANT
BALLOON ULTRASCORE 5X40X130 (BALLOONS) ×1 IMPLANT
BALLOON ULTRSC 014 2.5X100X150 (BALLOONS) ×1 IMPLANT
CANNULA 5F STIFF (CANNULA) ×2 IMPLANT
CATH ANGIO 5F PIGTAIL 65CM (CATHETERS) ×2 IMPLANT
CATH TEMPO 5F RIM 65CM (CATHETERS) ×2 IMPLANT
COVER EZ STRL 42X30 (DRAPES) ×2 IMPLANT
COVER PROBE U/S 5X48 (MISCELLANEOUS) ×2 IMPLANT
DEVICE STARCLOSE SE CLOSURE (Vascular Products) ×2 IMPLANT
GLIDECATH ANGLED 4FR 120CM (CATHETERS) ×2 IMPLANT
GLIDEWIRE ADV .035X260CM (WIRE) ×2 IMPLANT
KIT ENCORE 26 ADVANTAGE (KITS) ×2 IMPLANT
PACK ANGIOGRAPHY (CUSTOM PROCEDURE TRAY) ×2 IMPLANT
SHEATH BRITE TIP 5FRX11 (SHEATH) ×2 IMPLANT
SHEATH RAABE 6FR (SHEATH) ×2 IMPLANT
SYR MEDRAD MARK 7 150ML (SYRINGE) ×2 IMPLANT
TUBING CONTRAST HIGH PRESS 72 (TUBING) ×2 IMPLANT
WIRE GUIDERIGHT .035X150 (WIRE) ×2 IMPLANT
WIRE RUNTHROUGH .014X300CM (WIRE) ×2 IMPLANT

## 2020-08-15 NOTE — Interval H&P Note (Signed)
History and Physical Interval Note:  08/15/2020 9:39 AM  Alex Kemp  has presented today for surgery, with the diagnosis of LLE Angiography   PAD   BARD Rep   cc: Judi Cong PT to have Covid test on 3-18.  The various methods of treatment have been discussed with the patient and family. After consideration of risks, benefits and other options for treatment, the patient has consented to  Procedure(s): LOWER EXTREMITY ANGIOGRAPHY (Left) as a surgical intervention.  The patient's history has been reviewed, patient examined, no change in status, stable for surgery.  I have reviewed the patient's chart and labs.  Questions were answered to the patient's satisfaction.     [name]

## 2020-08-15 NOTE — Op Note (Signed)
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 Introduction catheter into left lower extremity 3rd order catheter placement  2. Contrast injection left lower extremity for distal runoff  3. Percutaneous transluminal angioplasty left superficial femoral artery to 5 mm with Lutonix drug-eluting balloon 4. Percutaneous transluminal angioplasty left posterior tibial artery to 2.5 mm with an ultra score balloon  5. Star close closure right common femoral arteriotomy  Anesthesia: Conscious sedation was administered under my direct supervision by the interventional radiology RN. IV Versed plus fentanyl were utilized. Continuous ECG, pulse oximetry and blood pressure was monitored throughout the entire procedure.  Conscious sedation was for a total of 38 minutes.  Sheath: 6 French Raby right common femoral retrograde  Contrast: 45 cc  Fluoroscopy Time: 4.1 minutes  Indications: Vincen Bejar presents with increasing pain of the left lower extremity.  He recently underwent revascularization of his right lower extremity secondary to rest pain symptoms.  He is had dramatic improvement in his walking ability secondary to this procedure that he is now requesting his left lower extremity be treated secondary to lifestyle limitations.  The risks and benefits are reviewed all questions answered patient agrees to proceed.  Procedure:Alex Kemp is a 80 y.o. y.o. male who was identified and appropriate procedural time out was performed. The patient was then placed supine on the table and prepped and draped in the usual sterile fashion.    Ultrasound was placed in the sterile sleeve and the right groin was evaluated the right common femoral artery was echolucent and pulsatile indicating patency. Image was recorded for the permanent record and under real-time visualization a microneedle was inserted into the common femoral artery followed by the microwire and then the micro-sheath. A J-wire was then advanced through the micro-sheath and a 5 [place] sheath was then inserted over a J-wire. J-wire was then advanced and a 5 French pigtail catheter was positioned at the level of the mid infrarenal aorta.   An RAO view of the pelvis was obtained. Subsequently a pigtail catheter with the stiff angle Glidewire was used to cross the aortic bifurcation the catheter wire were advanced down into the left distal external iliac artery. Oblique view of the femoral bifurcation was then obtained and subsequently the wire was reintroduced and the pigtail catheter negotiated into the SFA representing third order catheter placement. Distal runoff was then performed.  Diagnostic interpretation: The abdominal aorta was imaged in the previous angiogram approximately 1 month ago.  The distal aorta remains patent.  Bilateral common and external iliac arteries are widely patent.  The left common femoral demonstrates mild atherosclerotic changes as does the profunda femoris but they are widely patent.  The left superficial femoral artery demonstrates diffuse atherosclerotic changes with less than 40% narrowings throughout its course until Hunter's canal at which point there is a fairly focal greater than 90% stenosis.  This lesion is approximately 40 mm in length.  The popliteal distal to this focal stenosis is mildly atherosclerotic but widely patent.  The trifurcation is patent with the anterior tibial being the dominant runoff to the foot.  The distal tibioperoneal trunk demonstrates a focal 80% stenosis the posterior tibial demonstrates 3 tandem lesions of greater  than 80% that extend over approximately 10 cm.  Distal to these lesions the posterior  tibial is widely patent and fills the plantar arteries and the pedal arch.  The peroneal demonstrates some proximal disease but is otherwise patent down to the ankle but does not contribute significantly across the ankle.  The anterior tibial although approximately is the dominant vessel distally in the dorsalis pedis there is a focal greater than 60% stenosis  5000 units of heparin was then given and allowed to circulate and a 6 [place] Raby sheath was advanced up and over the bifurcation and positioned in the femoral artery  Straight catheter and advantage Glidewire were then negotiated down into the distal popliteal. Catheter was then advanced. Hand injection contrast demonstrated the tibial anatomy in detail.  5 mm x 40 mm ultra score balloon was used to angioplasty the SFA.  The inflation was for 1 minute at 12 atm.  After this initial inflation a 5 mm x 60 mm Lutonix drug-eluting balloon was advanced across this lesion inflated to 10 atm for 1 full minute.  Follow-up imaging demonstrated excellent patency with preservation of the distal runoff and less than 5% residual stenosis.  The detector was then repositioned and the trifurcation was reimaged multiple greater than 80% stenosis are noted in the posterior tibial and after appropriate measurements are made a 2.5 mm x 100 mm ultra score balloon was used to angioplasty the posterior tibial. Inflation was to 12 atmospheres for 2 minutes. Follow-up imaging demonstrated patency with excellent result and less than 5% residual stenosis. Distal runoff was then reassessed and noted to be widely patent.   After review of these images the sheath is pulled into the right external iliac oblique of the common femoral is obtained and a Star close device deployed. There no immediate Complications.  Findings:  The abdominal aorta was imaged in the previous angiogram  approximately 1 month ago.  The distal aorta remains patent.  Bilateral common and external iliac arteries are widely patent.  The left common femoral demonstrates mild atherosclerotic changes as does the profunda femoris but they are widely patent.  The left superficial femoral artery demonstrates diffuse atherosclerotic changes with less than 40% narrowings throughout its course until Hunter's canal at which point there is a fairly focal greater than 90% stenosis.  This lesion is approximately 40 mm in length.  The popliteal distal to this focal stenosis is mildly atherosclerotic but widely patent.  The trifurcation is patent with the anterior tibial being the dominant runoff to the foot.  The distal tibioperoneal trunk demonstrates a focal 80% stenosis the posterior tibial demonstrates 3 tandem lesions of greater than 80% that extend over approximately 10 cm.  Distal to these lesions the posterior tibial is widely patent and fills the plantar arteries and the pedal arch.  The peroneal demonstrates some proximal disease but is otherwise patent down to the ankle but does not contribute significantly across the ankle.  The anterior tibial although approximately is the dominant vessel distally in the dorsalis pedis there is a focal greater than 60% stenosis.  Angioplasty of the SFA to 5 mm yields an excellent result with less than 5% residual stenosis.  Angioplasty of the posterior tibial to 2.5 mm yields an excellent result with less than 5% residual stenosis    Summary: Successful recanalization left lower extremity for limb salvage   Disposition: Patient was taken to the recovery room in stable condition having tolerated the procedure well.  Belenda Cruise Mitzy Naron 08/15/2020,11:58 AM

## 2020-08-15 NOTE — Discharge Instructions (Signed)

## 2020-08-16 ENCOUNTER — Encounter: Payer: Self-pay | Admitting: Vascular Surgery

## 2020-08-27 ENCOUNTER — Other Ambulatory Visit: Payer: Self-pay | Admitting: Internal Medicine

## 2020-08-27 NOTE — Telephone Encounter (Signed)
Please refill as per office routine med refill policy (all routine meds refilled for 3 mo or monthly per pt preference up to one year from last visit, then month to month grace period for 3 mo, then further med refills will have to be denied)  

## 2020-09-04 ENCOUNTER — Other Ambulatory Visit (INDEPENDENT_AMBULATORY_CARE_PROVIDER_SITE_OTHER): Payer: Self-pay | Admitting: Vascular Surgery

## 2020-09-04 DIAGNOSIS — Z9862 Peripheral vascular angioplasty status: Secondary | ICD-10-CM

## 2020-09-04 DIAGNOSIS — I70212 Atherosclerosis of native arteries of extremities with intermittent claudication, left leg: Secondary | ICD-10-CM
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
## 2020-09-07 ENCOUNTER — Ambulatory Visit (INDEPENDENT_AMBULATORY_CARE_PROVIDER_SITE_OTHER): Payer: Medicare Other

## 2020-09-07 ENCOUNTER — Ambulatory Visit (INDEPENDENT_AMBULATORY_CARE_PROVIDER_SITE_OTHER): Payer: Medicare Other | Admitting: Vascular Surgery

## 2020-09-07 ENCOUNTER — Encounter (INDEPENDENT_AMBULATORY_CARE_PROVIDER_SITE_OTHER): Payer: Self-pay | Admitting: Vascular Surgery

## 2020-09-07 ENCOUNTER — Other Ambulatory Visit: Payer: Self-pay

## 2020-09-07 VITALS — BP 107/64 | HR 102 | Resp 16 | Wt 254.0 lb

## 2020-09-07 DIAGNOSIS — Z9862 Peripheral vascular angioplasty status: Secondary | ICD-10-CM | POA: Diagnosis not present

## 2020-09-07 DIAGNOSIS — I70212 Atherosclerosis of native arteries of extremities with intermittent claudication, left leg: Secondary | ICD-10-CM

## 2020-09-07 DIAGNOSIS — I70213 Atherosclerosis of native arteries of extremities with intermittent claudication, bilateral legs: Secondary | ICD-10-CM

## 2020-09-07 DIAGNOSIS — I10 Essential (primary) hypertension: Secondary | ICD-10-CM

## 2020-09-07 DIAGNOSIS — M519 Unspecified thoracic, thoracolumbar and lumbosacral intervertebral disc disorder: Secondary | ICD-10-CM | POA: Diagnosis not present

## 2020-09-07 DIAGNOSIS — E782 Mixed hyperlipidemia: Secondary | ICD-10-CM

## 2020-09-18 ENCOUNTER — Encounter: Payer: Self-pay | Admitting: Internal Medicine

## 2020-09-19 ENCOUNTER — Other Ambulatory Visit: Payer: Self-pay | Admitting: Internal Medicine

## 2020-09-19 NOTE — Telephone Encounter (Signed)
Ok to contact pt -   Unless he has been told he should take this iron tablet for the long term due to chronic blood loss , I think this can be stopped.  His last iron testing was normal in jan 2021 at [place]

## 2020-09-20 ENCOUNTER — Encounter (INDEPENDENT_AMBULATORY_CARE_PROVIDER_SITE_OTHER): Payer: Self-pay | Admitting: Vascular Surgery

## 2020-09-20 DIAGNOSIS — I70219 Atherosclerosis of native arteries of extremities with intermittent claudication, unspecified extremity: Secondary | ICD-10-CM | POA: Insufficient documentation

## 2020-09-21 NOTE — Telephone Encounter (Signed)
Patient has been notified and verbalizes understanding ?

## 2020-09-27 ENCOUNTER — Other Ambulatory Visit: Payer: Self-pay | Admitting: Internal Medicine

## 2020-10-10 DIAGNOSIS — I129 Hypertensive chronic kidney disease with stage 1 through stage 4 chronic kidney disease, or unspecified chronic kidney disease: Secondary | ICD-10-CM | POA: Diagnosis not present

## 2020-10-10 DIAGNOSIS — N1832 Chronic kidney disease, stage 3b: Secondary | ICD-10-CM | POA: Diagnosis not present

## 2020-10-18 ENCOUNTER — Other Ambulatory Visit: Payer: Self-pay | Admitting: Internal Medicine

## 2020-12-05 ENCOUNTER — Other Ambulatory Visit (INDEPENDENT_AMBULATORY_CARE_PROVIDER_SITE_OTHER): Payer: Self-pay | Admitting: Vascular Surgery

## 2020-12-05 NOTE — Progress Notes (Signed)
Virtual Visit via Video Note  I connected with Alex Kemp on 12/05/20 at 10:20 AM EDT by a video enabled telemedicine application and verified that I am speaking with the correct person using two identifiers.   I discussed the limitations of evaluation and management by telemedicine and the availability of in person appointments. The patient expressed understanding and agreed to proceed.  Present for the visit:  Myself, Dr Billey Gosling, Alex Kemp.  The patient is currently at home and I am in the office.    No referring provider.    History of Present Illness: This is an acute visit for covid.    He has had 5 covid vaccines.  June 20th went to [place] x 2 weeks. Was getting ready to get on cruise.  He tested positive.  He had minimal symptoms,cough and some fatigue which he thought it was related to other things.  He tested positive a few times.  He was not able to go on the cruise.  He did come home and really has not felt that sick.  He wanted to follow-up to see if anything needed to be done or if he needed any treatment.  Temp yesterday was normal.     Review of Systems  Constitutional:  Negative for fever.       Dec appetite initially x couple of days  HENT:  Negative for congestion, sinus pain and sore throat.        No Loss of taste or smell  Respiratory:  Positive for cough (resolved). Negative for shortness of breath and wheezing.   Neurological:  Negative for headaches.     Social History   Socioeconomic History   Marital status: Significant Other    Spouse name: Not on file   Number of children: 1   Years of education: Not on file   Highest education level: Not on file  Occupational History   Occupation: [employer] being flexible  Tobacco Use   Smoking status: Former    Packs/day: 2.25    Years: 20.00    Pack years: 45.00    Types: Cigarettes    Quit date: 12/26/1978    Years since quitting: 41.9   Smokeless tobacco: Never  Vaping Use   Vaping Use: Never used   Substance and Sexual Activity   Alcohol use: Yes    Comment: 04/23/2017 "couple glasses of wine/month"   Drug use: No   Sexual activity: Yes  Other Topics Concern   Not on file  Social History Narrative   Not on file   Social Determinants of Health   Financial Resource Strain: Low Risk    Difficulty of Paying Living Expenses: Not hard at all  Food Insecurity: No Food Insecurity   Worried About Charity fundraiser in the Last Year: Never true   [place] in the Last Year: Never true  Transportation Needs: No Transportation Needs   Lack of Transportation (Medical): No   Lack of Transportation (Non-Medical): No  Physical Activity: Inactive   Days of Exercise per Week: 0 days   Minutes of Exercise per Session: 0 min  Stress: No Stress Concern Present   Feeling of Stress : Not at all  Social Connections: Socially Integrated   Frequency of Communication with Friends and Family: More than three times a week   Frequency of Social Gatherings with Friends and Family: More than three times a week   Attends Religious Services: More than 4 times per year  Active Member of Clubs or Organizations: Not on file   Attends Club or Organization Meetings: More than 4 times per year   Marital Status: Married     Observations/Objective: Appears well in NAD Breathing normally  Assessment and Plan:  See Problem List for Assessment and Plan of chronic medical problems.   Follow Up Instructions:    I discussed the assessment and treatment plan with the patient. The patient was provided an opportunity to ask questions and all were answered. The patient agreed with the plan and demonstrated an understanding of the instructions.   The patient was advised to call back or seek an in-person evaluation if the symptoms worsen or if the condition fails to improve as anticipated.    Binnie Rail, MD

## 2020-12-06 ENCOUNTER — Encounter: Payer: Self-pay | Admitting: Internal Medicine

## 2020-12-06 ENCOUNTER — Telehealth (INDEPENDENT_AMBULATORY_CARE_PROVIDER_SITE_OTHER): Payer: Self-pay

## 2020-12-06 ENCOUNTER — Telehealth (INDEPENDENT_AMBULATORY_CARE_PROVIDER_SITE_OTHER): Payer: Medicare Other | Admitting: Internal Medicine

## 2020-12-06 DIAGNOSIS — U071 COVID-19: Secondary | ICD-10-CM | POA: Diagnosis not present

## 2020-12-06 DIAGNOSIS — I70213 Atherosclerosis of native arteries of extremities with intermittent claudication, bilateral legs: Secondary | ICD-10-CM

## 2020-12-06 MED ORDER — BINAXNOW COVID-19 AG HOME TEST VI KIT: PACK | 0 refills | Status: DC

## 2020-12-06 NOTE — Assessment & Plan Note (Signed)
Acute Had COVID Symptoms very mild and pretty much resolved at this time No treatment needed If he does have any residual symptoms can treat symptomatically He does have concerns about future travel, which we discussed-can discuss further with his PCP at his follow-up appointment He did request prescription for COVID home test kits-sent to mail away pharmacy

## 2020-12-06 NOTE — Telephone Encounter (Signed)
The pt called and left a Vm  on the nurses line saying that his Plavix was sent to the wrong pharmacy and [facility]. Please advise.

## 2020-12-07 ENCOUNTER — Other Ambulatory Visit (INDEPENDENT_AMBULATORY_CARE_PROVIDER_SITE_OTHER): Payer: Self-pay | Admitting: Nurse Practitioner

## 2020-12-07 MED ORDER — CLOPIDOGREL BISULFATE 75 MG PO TABS: 75.0000 mg | ORAL_TABLET | Freq: Every day | ORAL | 5 refills | Status: DC

## 2020-12-07 NOTE — Telephone Encounter (Signed)
Sent!

## 2020-12-09 ENCOUNTER — Other Ambulatory Visit: Payer: Self-pay | Admitting: Internal Medicine

## 2020-12-11 ENCOUNTER — Encounter: Payer: Self-pay | Admitting: Internal Medicine

## 2020-12-12 ENCOUNTER — Encounter (INDEPENDENT_AMBULATORY_CARE_PROVIDER_SITE_OTHER): Payer: Self-pay

## 2020-12-14 ENCOUNTER — Encounter (INDEPENDENT_AMBULATORY_CARE_PROVIDER_SITE_OTHER): Payer: Medicare Other

## 2020-12-14 ENCOUNTER — Ambulatory Visit (INDEPENDENT_AMBULATORY_CARE_PROVIDER_SITE_OTHER): Payer: Medicare Other | Admitting: Vascular Surgery

## 2020-12-27 ENCOUNTER — Other Ambulatory Visit (INDEPENDENT_AMBULATORY_CARE_PROVIDER_SITE_OTHER): Payer: Self-pay | Admitting: Vascular Surgery

## 2020-12-27 DIAGNOSIS — H5213 Myopia, bilateral: Secondary | ICD-10-CM | POA: Diagnosis not present

## 2020-12-27 DIAGNOSIS — I70213 Atherosclerosis of native arteries of extremities with intermittent claudication, bilateral legs: Secondary | ICD-10-CM

## 2020-12-27 DIAGNOSIS — H43813 Vitreous degeneration, bilateral: Secondary | ICD-10-CM | POA: Diagnosis not present

## 2020-12-27 DIAGNOSIS — H2513 Age-related nuclear cataract, bilateral: Secondary | ICD-10-CM | POA: Diagnosis not present

## 2020-12-27 DIAGNOSIS — H25013 Cortical age-related cataract, bilateral: Secondary | ICD-10-CM | POA: Diagnosis not present

## 2020-12-27 DIAGNOSIS — Z9862 Peripheral vascular angioplasty status: Secondary | ICD-10-CM

## 2020-12-28 ENCOUNTER — Other Ambulatory Visit: Payer: Self-pay

## 2020-12-28 ENCOUNTER — Ambulatory Visit (INDEPENDENT_AMBULATORY_CARE_PROVIDER_SITE_OTHER): Payer: Medicare Other

## 2020-12-28 ENCOUNTER — Ambulatory Visit (INDEPENDENT_AMBULATORY_CARE_PROVIDER_SITE_OTHER): Payer: Medicare Other | Admitting: Vascular Surgery

## 2020-12-28 ENCOUNTER — Encounter (INDEPENDENT_AMBULATORY_CARE_PROVIDER_SITE_OTHER): Payer: Self-pay | Admitting: Vascular Surgery

## 2020-12-28 VITALS — BP 124/72 | HR 86 | Ht 74.0 in | Wt 256.0 lb

## 2020-12-28 DIAGNOSIS — E782 Mixed hyperlipidemia: Secondary | ICD-10-CM | POA: Diagnosis not present

## 2020-12-28 DIAGNOSIS — K219 Gastro-esophageal reflux disease without esophagitis: Secondary | ICD-10-CM | POA: Diagnosis not present

## 2020-12-28 DIAGNOSIS — Z9862 Peripheral vascular angioplasty status: Secondary | ICD-10-CM

## 2020-12-28 DIAGNOSIS — I70213 Atherosclerosis of native arteries of extremities with intermittent claudication, bilateral legs: Secondary | ICD-10-CM

## 2020-12-28 DIAGNOSIS — I10 Essential (primary) hypertension: Secondary | ICD-10-CM

## 2020-12-28 DIAGNOSIS — M519 Unspecified thoracic, thoracolumbar and lumbosacral intervertebral disc disorder: Secondary | ICD-10-CM | POA: Diagnosis not present
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
## 2020-12-31 MED ORDER — CLOPIDOGREL BISULFATE 75 MG PO TABS: 75.0000 mg | ORAL_TABLET | Freq: Every day | ORAL | 4 refills | Status: DC

## 2021-01-04 ENCOUNTER — Telehealth: Payer: Self-pay | Admitting: Internal Medicine

## 2021-01-04 DIAGNOSIS — R7303 Prediabetes: Secondary | ICD-10-CM

## 2021-01-04 DIAGNOSIS — R972 Elevated prostate specific antigen [PSA]: Secondary | ICD-10-CM

## 2021-01-04 DIAGNOSIS — E538 Deficiency of other specified B group vitamins: Secondary | ICD-10-CM

## 2021-01-04 DIAGNOSIS — E782 Mixed hyperlipidemia: Secondary | ICD-10-CM

## 2021-01-04 DIAGNOSIS — E559 Vitamin D deficiency, unspecified: Secondary | ICD-10-CM

## 2021-01-04 NOTE — Telephone Encounter (Signed)
Pt has PCP appt 8/22 and lab appt 8/17.  Please add lab orders for this pt.

## 2021-01-04 NOTE — Telephone Encounter (Signed)
Ok but not sure it fhis was appropriate as he appears to have medicare as his primary insurance

## 2021-01-08 ENCOUNTER — Ambulatory Visit: Payer: Medicare Other | Admitting: Internal Medicine

## 2021-01-08 ENCOUNTER — Other Ambulatory Visit: Payer: Self-pay | Admitting: Internal Medicine

## 2021-01-08 DIAGNOSIS — N401 Enlarged prostate with lower urinary tract symptoms: Secondary | ICD-10-CM | POA: Diagnosis not present

## 2021-01-08 DIAGNOSIS — E291 Testicular hypofunction: Secondary | ICD-10-CM | POA: Diagnosis not present

## 2021-01-08 DIAGNOSIS — R972 Elevated prostate specific antigen [PSA]: Secondary | ICD-10-CM | POA: Diagnosis not present

## 2021-01-08 DIAGNOSIS — N529 Male erectile dysfunction, unspecified: Secondary | ICD-10-CM | POA: Diagnosis not present

## 2021-01-08 DIAGNOSIS — N138 Other obstructive and reflux uropathy: Secondary | ICD-10-CM | POA: Diagnosis not present

## 2021-01-08 NOTE — Telephone Encounter (Signed)
Please refill as per office routine med refill policy (all routine meds refilled for 3 mo or monthly per pt preference up to one year from last visit, then month to month grace period for 3 mo, then further med refills will have to be denied)  

## 2021-01-10 ENCOUNTER — Other Ambulatory Visit: Payer: Self-pay

## 2021-01-10 ENCOUNTER — Other Ambulatory Visit (INDEPENDENT_AMBULATORY_CARE_PROVIDER_SITE_OTHER): Payer: Medicare Other

## 2021-01-10 DIAGNOSIS — R972 Elevated prostate specific antigen [PSA]: Secondary | ICD-10-CM | POA: Diagnosis not present

## 2021-01-10 DIAGNOSIS — E559 Vitamin D deficiency, unspecified: Secondary | ICD-10-CM | POA: Diagnosis not present

## 2021-01-10 DIAGNOSIS — E538 Deficiency of other specified B group vitamins: Secondary | ICD-10-CM

## 2021-01-10 DIAGNOSIS — E782 Mixed hyperlipidemia: Secondary | ICD-10-CM

## 2021-01-10 DIAGNOSIS — R7303 Prediabetes: Secondary | ICD-10-CM

## 2021-01-10 LAB — BASIC METABOLIC PANEL
BUN: 19 mg/dL (ref 6–23)
CO2: 25 mEq/L (ref 19–32)
Calcium: 8.9 mg/dL (ref 8.4–10.5)
Chloride: 108 mEq/L (ref 96–112)
Creatinine, Ser: 1.59 mg/dL — ABNORMAL HIGH (ref 0.40–1.50)
GFR: 41.12 mL/min — ABNORMAL LOW (ref >60.00)
Glucose, Bld: 113 mg/dL — ABNORMAL HIGH (ref 70–99)
Potassium: 4.3 mEq/L (ref 3.5–5.1)
Sodium: 142 mEq/L (ref 135–145)

## 2021-01-10 LAB — CBC WITH DIFFERENTIAL/PLATELET
Basophils Absolute: 0 10*3/uL (ref 0.0–0.1)
Basophils Relative: 0.7 % (ref 0.0–3.0)
Eosinophils Absolute: 0.2 10*3/uL (ref 0.0–0.7)
Eosinophils Relative: 3.2 % (ref 0.0–5.0)
HCT: 36.6 % — ABNORMAL LOW (ref 39.0–52.0)
Hemoglobin: 12.6 g/dL — ABNORMAL LOW (ref 13.0–17.0)
Lymphocytes Relative: 36.2 % (ref 12.0–46.0)
Lymphs Abs: 2.4 10*3/uL (ref 0.7–4.0)
MCHC: 34.4 g/dL (ref 30.0–36.0)
MCV: 92.8 fl (ref 78.0–100.0)
Monocytes Absolute: 0.6 10*3/uL (ref 0.1–1.0)
Monocytes Relative: 8.4 % (ref 3.0–12.0)
Neutro Abs: 3.4 10*3/uL (ref 1.4–7.7)
Neutrophils Relative %: 51.5 % (ref 43.0–77.0)
Platelets: 200 10*3/uL (ref 150.0–400.0)
RBC: 3.94 Mil/uL — ABNORMAL LOW (ref 4.22–5.81)
RDW: 14.5 % (ref 11.5–15.5)
WBC: 6.6 10*3/uL (ref 4.0–10.5)

## 2021-01-10 LAB — LIPID PANEL
Cholesterol: 147 mg/dL (ref 0–200)
HDL: 52.9 mg/dL (ref >39.00)
LDL Cholesterol: 75 mg/dL (ref 0–99)
NonHDL: 94.53
Total CHOL/HDL Ratio: 3
Triglycerides: 97 mg/dL (ref 0.0–149.0)
VLDL: 19.4 mg/dL (ref 0.0–40.0)

## 2021-01-10 LAB — HEPATIC FUNCTION PANEL
ALT: 22 U/L (ref 0–53)
AST: 14 U/L (ref 0–37)
Albumin: 3.9 g/dL (ref 3.5–5.2)
Alkaline Phosphatase: 112 U/L (ref 39–117)
Bilirubin, Direct: 0.1 mg/dL (ref 0.0–0.3)
Total Bilirubin: 0.5 mg/dL (ref 0.2–1.2)
Total Protein: 6.4 g/dL (ref 6.0–8.3)

## 2021-01-10 LAB — PSA: PSA: 1.38 ng/mL (ref 0.10–4.00)

## 2021-01-10 LAB — URINALYSIS, ROUTINE W REFLEX MICROSCOPIC
Bilirubin Urine: NEGATIVE
Hgb urine dipstick: NEGATIVE
Ketones, ur: NEGATIVE
Leukocytes,Ua: NEGATIVE
Nitrite: NEGATIVE
Specific Gravity, Urine: 1.025 (ref 1.000–1.030)
Total Protein, Urine: NEGATIVE
Urine Glucose: NEGATIVE
Urobilinogen, UA: 0.2 (ref 0.0–1.0)
pH: 5.5 (ref 5.0–8.0)

## 2021-01-10 LAB — TSH: TSH: 2.78 u[IU]/mL (ref 0.35–5.50)

## 2021-01-10 LAB — HEMOGLOBIN A1C: Hgb A1c MFr Bld: 6.1 % (ref 4.6–6.5)

## 2021-01-10 LAB — VITAMIN D 25 HYDROXY (VIT D DEFICIENCY, FRACTURES): VITD: 39.02 ng/mL (ref 30.00–100.00)

## 2021-01-10 LAB — VITAMIN B12: Vitamin B-12: 1273 pg/mL — ABNORMAL HIGH (ref 211–911)

## 2021-01-15 ENCOUNTER — Other Ambulatory Visit: Payer: Self-pay

## 2021-01-15 ENCOUNTER — Ambulatory Visit (INDEPENDENT_AMBULATORY_CARE_PROVIDER_SITE_OTHER): Payer: Medicare Other | Admitting: Internal Medicine

## 2021-01-15 ENCOUNTER — Encounter: Payer: Self-pay | Admitting: Internal Medicine

## 2021-01-15 VITALS — BP 130/64 | HR 83 | Temp 98.2°F | Resp 20 | Ht 71.0 in | Wt 260.0 lb

## 2021-01-15 DIAGNOSIS — Z9889 Other specified postprocedural states: Secondary | ICD-10-CM | POA: Diagnosis not present

## 2021-01-15 DIAGNOSIS — U071 COVID-19: Secondary | ICD-10-CM

## 2021-01-15 DIAGNOSIS — I70213 Atherosclerosis of native arteries of extremities with intermittent claudication, bilateral legs: Secondary | ICD-10-CM | POA: Diagnosis not present

## 2021-01-15 DIAGNOSIS — J309 Allergic rhinitis, unspecified: Secondary | ICD-10-CM | POA: Diagnosis not present

## 2021-01-15 DIAGNOSIS — I10 Essential (primary) hypertension: Secondary | ICD-10-CM | POA: Diagnosis not present

## 2021-01-15 DIAGNOSIS — G9519 Other vascular myelopathies: Secondary | ICD-10-CM

## 2021-01-15 MED ORDER — TESTOSTERONE 50 MG/5GM (1%) TD GEL: 10.0000 g | Freq: Every day | TRANSDERMAL | 1 refills | Status: DC

## 2021-01-15 MED ORDER — PANTOPRAZOLE SODIUM 40 MG PO TBEC: 40.0000 mg | DELAYED_RELEASE_TABLET | Freq: Two times a day (BID) | ORAL | 3 refills | Status: DC

## 2021-01-15 MED ORDER — ALLOPURINOL 100 MG PO TABS: 100.0000 mg | ORAL_TABLET | Freq: Every day | ORAL | 3 refills | Status: DC

## 2021-01-15 MED ORDER — TRIAMCINOLONE ACETONIDE 55 MCG/ACT NA AERO: 2.0000 | INHALATION_SPRAY | Freq: Every day | NASAL | 12 refills | Status: DC
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
## 2021-01-15 NOTE — Patient Instructions (Addendum)
You are given the "ok for next cruise" letter  You will be contacted regarding the referral for: Dr Lorin Mercy  Please take all new medication as prescribed - the nasacort for sinus ([facility])  You can also takeMucinex (or it's generic off brand) for congestion, and tylenol as needed for ear pain.  Please continue all other medications as before, and refills have been done if requested.  Please have the pharmacy call with any other refills you may need.  Please continue your efforts at being more active, low cholesterol diet, and weight control.  You are otherwise up to date with prevention measures today.  Please keep your appointments with your specialists as you may have planned  Your lab work was good today  Please make an Appointment to return in 6 months, or sooner if needed

## 2021-01-16 ENCOUNTER — Ambulatory Visit (INDEPENDENT_AMBULATORY_CARE_PROVIDER_SITE_OTHER): Payer: Medicare Other | Admitting: Orthopaedic Surgery

## 2021-01-16 ENCOUNTER — Ambulatory Visit (INDEPENDENT_AMBULATORY_CARE_PROVIDER_SITE_OTHER): Payer: Medicare Other

## 2021-01-16 ENCOUNTER — Encounter: Payer: Self-pay | Admitting: Orthopaedic Surgery

## 2021-01-16 VITALS — BP 115/75 | HR 82 | Ht 71.0 in | Wt 260.0 lb

## 2021-01-16 DIAGNOSIS — M545 Low back pain, unspecified: Secondary | ICD-10-CM | POA: Diagnosis not present

## 2021-01-16 DIAGNOSIS — Z9889 Other specified postprocedural states: Secondary | ICD-10-CM

## 2021-01-16 DIAGNOSIS — I70213 Atherosclerosis of native arteries of extremities with intermittent claudication, bilateral legs: Secondary | ICD-10-CM | POA: Diagnosis not present

## 2021-01-16 DIAGNOSIS — G8929 Other chronic pain: Secondary | ICD-10-CM | POA: Diagnosis not present

## 2021-01-16 NOTE — Addendum Note (Signed)
Addended by: Meyer Cory on: 01/16/2021 09:42 AM   Modules accepted: Orders
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
## 2021-01-18 ENCOUNTER — Encounter: Payer: Self-pay | Admitting: Internal Medicine

## 2021-01-18 DIAGNOSIS — J309 Allergic rhinitis, unspecified: Secondary | ICD-10-CM | POA: Insufficient documentation

## 2021-01-18 NOTE — Assessment & Plan Note (Signed)
Mild to mod, for nasacort and mucinex bid prn eustachian valve symptoms,  to f/u any worsening symptoms or concerns

## 2021-01-18 NOTE — Assessment & Plan Note (Signed)
With persistent pain and ambulatory debility worsening, will refer back to Dr Lorin Mercy per pt request

## 2021-01-18 NOTE — Assessment & Plan Note (Signed)
BP Readings from Last 3 Encounters:  01/16/21 115/75  01/15/21 130/64  12/28/20 124/72   Stable, pt to continue medical treatment norvasc, losartan

## 2021-01-18 NOTE — Assessment & Plan Note (Signed)
Now resolved, but form filled out and letter to cruise [employer] for trip insurance reimbursement

## 2021-01-22 ENCOUNTER — Encounter: Payer: Self-pay | Admitting: Internal Medicine

## 2021-02-07 ENCOUNTER — Encounter: Payer: Self-pay | Admitting: Physical Therapy

## 2021-02-07 ENCOUNTER — Ambulatory Visit (INDEPENDENT_AMBULATORY_CARE_PROVIDER_SITE_OTHER): Payer: Medicare Other | Admitting: Physical Therapy

## 2021-02-07 ENCOUNTER — Other Ambulatory Visit: Payer: Self-pay

## 2021-02-07 DIAGNOSIS — R2681 Unsteadiness on feet: Secondary | ICD-10-CM | POA: Diagnosis not present

## 2021-02-07 DIAGNOSIS — R2689 Other abnormalities of gait and mobility: Secondary | ICD-10-CM | POA: Diagnosis not present

## 2021-02-07 DIAGNOSIS — M6281 Muscle weakness (generalized): Secondary | ICD-10-CM
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
## 2021-02-07 NOTE — Patient Instructions (Signed)
Access Code: [email or web address] URL: [email or web address] Date: 02/07/2021 Prepared by: Almyra Free  Exercises Sit to Stand - 2 x daily - 7 x weekly - 1 sets - 10 reps Standing Romberg to 1/2 Tandem Stance - 2 x daily - 7 x weekly - 1 sets - 5 reps - max hold Seated Ankle Plantarflexion Dorsiflexion PROM - 2 x daily - 7 x weekly - 1 sets - 3 reps - 30 sec hold Seated Piriformis Stretch - 3 x daily - 7 x weekly - 3 reps - 1 sets - 30-60 sec hold

## 2021-03-19 ENCOUNTER — Ambulatory Visit: Payer: Medicare Other | Admitting: Physical Therapy

## 2021-03-27 ENCOUNTER — Encounter: Payer: Self-pay | Admitting: Physical Therapy

## 2021-03-27 ENCOUNTER — Ambulatory Visit (INDEPENDENT_AMBULATORY_CARE_PROVIDER_SITE_OTHER): Payer: Medicare Other | Admitting: Physical Therapy

## 2021-03-27 ENCOUNTER — Other Ambulatory Visit: Payer: Self-pay

## 2021-03-27 DIAGNOSIS — R262 Difficulty in walking, not elsewhere classified: Secondary | ICD-10-CM | POA: Diagnosis not present

## 2021-03-27 DIAGNOSIS — G8929 Other chronic pain: Secondary | ICD-10-CM | POA: Diagnosis not present

## 2021-03-27 DIAGNOSIS — R2681 Unsteadiness on feet: Secondary | ICD-10-CM

## 2021-03-27 DIAGNOSIS — R2689 Other abnormalities of gait and mobility: Secondary | ICD-10-CM

## 2021-03-27 DIAGNOSIS — M6281 Muscle weakness (generalized): Secondary | ICD-10-CM

## 2021-03-27 DIAGNOSIS — M545 Low back pain, unspecified: Secondary | ICD-10-CM | POA: Diagnosis not present

## 2021-03-27 NOTE — Patient Instructions (Signed)
Access Code: [email or web address] URL: [email or web address] Date: 03/27/2021 Prepared by: Kearney Hard  Exercises Supine Bridge - 2 x daily - 7 x weekly - 2 sets - 10 reps - 3 seconds hold Hooklying Single Knee to Chest Stretch - 2 x daily - 7 x weekly - 3 reps - 15-20 seconds hold Supine Lower Trunk Rotation - 2 x daily - 7 x weekly - 3 reps - 15-20 seconds hold Standing Hip Abduction with Counter Support - 2 x daily - 7 x weekly - 2 sets - 10 reps
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
## 2021-03-29 ENCOUNTER — Encounter: Payer: Self-pay | Admitting: Physical Therapy

## 2021-03-29 ENCOUNTER — Ambulatory Visit (INDEPENDENT_AMBULATORY_CARE_PROVIDER_SITE_OTHER): Payer: Medicare Other | Admitting: Physical Therapy

## 2021-03-29 ENCOUNTER — Other Ambulatory Visit: Payer: Self-pay

## 2021-03-29 DIAGNOSIS — R2681 Unsteadiness on feet: Secondary | ICD-10-CM

## 2021-03-29 DIAGNOSIS — M6281 Muscle weakness (generalized): Secondary | ICD-10-CM

## 2021-03-29 DIAGNOSIS — R262 Difficulty in walking, not elsewhere classified: Secondary | ICD-10-CM | POA: Diagnosis not present

## 2021-03-29 DIAGNOSIS — G8929 Other chronic pain: Secondary | ICD-10-CM | POA: Diagnosis not present

## 2021-03-29 DIAGNOSIS — R2689 Other abnormalities of gait and mobility: Secondary | ICD-10-CM | POA: Diagnosis not present

## 2021-03-29 DIAGNOSIS — M545 Low back pain, unspecified: Secondary | ICD-10-CM | POA: Diagnosis not present
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
## 2021-04-05 ENCOUNTER — Encounter: Payer: Medicare Other | Admitting: Physical Therapy

## 2021-04-05 ENCOUNTER — Encounter: Payer: Self-pay | Admitting: Internal Medicine

## 2021-04-06 MED ORDER — TEMAZEPAM 30 MG PO CAPS: ORAL_CAPSULE | ORAL | 1 refills | Status: DC

## 2021-04-10 ENCOUNTER — Ambulatory Visit (INDEPENDENT_AMBULATORY_CARE_PROVIDER_SITE_OTHER): Payer: Medicare Other | Admitting: Physical Therapy

## 2021-04-10 ENCOUNTER — Other Ambulatory Visit: Payer: Self-pay

## 2021-04-10 ENCOUNTER — Encounter: Payer: Self-pay | Admitting: Physical Therapy

## 2021-04-10 DIAGNOSIS — R2689 Other abnormalities of gait and mobility: Secondary | ICD-10-CM

## 2021-04-10 DIAGNOSIS — M545 Low back pain, unspecified: Secondary | ICD-10-CM | POA: Diagnosis not present

## 2021-04-10 DIAGNOSIS — G8929 Other chronic pain: Secondary | ICD-10-CM

## 2021-04-10 DIAGNOSIS — M6281 Muscle weakness (generalized): Secondary | ICD-10-CM

## 2021-04-10 DIAGNOSIS — R2681 Unsteadiness on feet: Secondary | ICD-10-CM | POA: Diagnosis not present

## 2021-04-10 DIAGNOSIS — R262 Difficulty in walking, not elsewhere classified: Secondary | ICD-10-CM
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
## 2021-04-11 DIAGNOSIS — I129 Hypertensive chronic kidney disease with stage 1 through stage 4 chronic kidney disease, or unspecified chronic kidney disease: Secondary | ICD-10-CM | POA: Diagnosis not present

## 2021-04-11 DIAGNOSIS — N1832 Chronic kidney disease, stage 3b: Secondary | ICD-10-CM | POA: Diagnosis not present

## 2021-04-12 ENCOUNTER — Encounter: Payer: Medicare Other | Admitting: Physical Therapy

## 2021-04-24 ENCOUNTER — Encounter: Payer: Medicare Other | Admitting: Physical Therapy

## 2021-04-26 ENCOUNTER — Encounter: Payer: Self-pay | Admitting: Physical Therapy

## 2021-04-26 ENCOUNTER — Other Ambulatory Visit: Payer: Self-pay

## 2021-04-26 ENCOUNTER — Ambulatory Visit (INDEPENDENT_AMBULATORY_CARE_PROVIDER_SITE_OTHER): Payer: Medicare Other | Admitting: Physical Therapy

## 2021-04-26 DIAGNOSIS — R2681 Unsteadiness on feet: Secondary | ICD-10-CM

## 2021-04-26 DIAGNOSIS — M6281 Muscle weakness (generalized): Secondary | ICD-10-CM

## 2021-04-26 DIAGNOSIS — M545 Low back pain, unspecified: Secondary | ICD-10-CM

## 2021-04-26 DIAGNOSIS — G8929 Other chronic pain: Secondary | ICD-10-CM | POA: Diagnosis not present

## 2021-04-26 DIAGNOSIS — R2689 Other abnormalities of gait and mobility: Secondary | ICD-10-CM | POA: Diagnosis not present

## 2021-04-26 DIAGNOSIS — R262 Difficulty in walking, not elsewhere classified: Secondary | ICD-10-CM | POA: Diagnosis not present
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
## 2021-04-30 ENCOUNTER — Ambulatory Visit (INDEPENDENT_AMBULATORY_CARE_PROVIDER_SITE_OTHER): Payer: Medicare Other | Admitting: Physical Therapy

## 2021-04-30 ENCOUNTER — Other Ambulatory Visit: Payer: Self-pay

## 2021-04-30 ENCOUNTER — Encounter: Payer: Self-pay | Admitting: Physical Therapy

## 2021-04-30 DIAGNOSIS — R2689 Other abnormalities of gait and mobility: Secondary | ICD-10-CM | POA: Diagnosis not present

## 2021-04-30 DIAGNOSIS — G8929 Other chronic pain: Secondary | ICD-10-CM | POA: Diagnosis not present

## 2021-04-30 DIAGNOSIS — M6281 Muscle weakness (generalized): Secondary | ICD-10-CM

## 2021-04-30 DIAGNOSIS — M545 Low back pain, unspecified: Secondary | ICD-10-CM

## 2021-04-30 DIAGNOSIS — R262 Difficulty in walking, not elsewhere classified: Secondary | ICD-10-CM | POA: Diagnosis not present

## 2021-04-30 DIAGNOSIS — R2681 Unsteadiness on feet: Secondary | ICD-10-CM | POA: Diagnosis not present
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
## 2021-05-08 ENCOUNTER — Ambulatory Visit (INDEPENDENT_AMBULATORY_CARE_PROVIDER_SITE_OTHER): Payer: Medicare Other | Admitting: Physical Therapy

## 2021-05-08 ENCOUNTER — Encounter: Payer: Self-pay | Admitting: Physical Therapy

## 2021-05-08 ENCOUNTER — Other Ambulatory Visit: Payer: Self-pay

## 2021-05-08 DIAGNOSIS — G8929 Other chronic pain: Secondary | ICD-10-CM

## 2021-05-08 DIAGNOSIS — M6281 Muscle weakness (generalized): Secondary | ICD-10-CM | POA: Diagnosis not present

## 2021-05-08 DIAGNOSIS — R2689 Other abnormalities of gait and mobility: Secondary | ICD-10-CM | POA: Diagnosis not present

## 2021-05-08 DIAGNOSIS — R262 Difficulty in walking, not elsewhere classified: Secondary | ICD-10-CM

## 2021-05-08 DIAGNOSIS — M545 Low back pain, unspecified: Secondary | ICD-10-CM | POA: Diagnosis not present

## 2021-05-08 DIAGNOSIS — R2681 Unsteadiness on feet: Secondary | ICD-10-CM | POA: Diagnosis not present
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
## 2021-05-14 ENCOUNTER — Other Ambulatory Visit: Payer: Self-pay

## 2021-05-14 ENCOUNTER — Ambulatory Visit (INDEPENDENT_AMBULATORY_CARE_PROVIDER_SITE_OTHER): Payer: Medicare Other | Admitting: Physical Therapy

## 2021-05-14 ENCOUNTER — Encounter: Payer: Self-pay | Admitting: Physical Therapy

## 2021-05-14 DIAGNOSIS — R2681 Unsteadiness on feet: Secondary | ICD-10-CM | POA: Diagnosis not present

## 2021-05-14 DIAGNOSIS — M545 Low back pain, unspecified: Secondary | ICD-10-CM | POA: Diagnosis not present

## 2021-05-14 DIAGNOSIS — R262 Difficulty in walking, not elsewhere classified: Secondary | ICD-10-CM

## 2021-05-14 DIAGNOSIS — R2689 Other abnormalities of gait and mobility: Secondary | ICD-10-CM | POA: Diagnosis not present

## 2021-05-14 DIAGNOSIS — G8929 Other chronic pain: Secondary | ICD-10-CM

## 2021-05-14 DIAGNOSIS — M6281 Muscle weakness (generalized): Secondary | ICD-10-CM
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
## 2021-05-17 ENCOUNTER — Encounter: Payer: Medicare Other | Admitting: Physical Therapy

## 2021-05-22 ENCOUNTER — Ambulatory Visit (INDEPENDENT_AMBULATORY_CARE_PROVIDER_SITE_OTHER): Payer: Medicare Other | Admitting: Physical Therapy

## 2021-05-22 ENCOUNTER — Other Ambulatory Visit: Payer: Self-pay

## 2021-05-22 DIAGNOSIS — R2689 Other abnormalities of gait and mobility: Secondary | ICD-10-CM

## 2021-05-22 DIAGNOSIS — R2681 Unsteadiness on feet: Secondary | ICD-10-CM

## 2021-05-22 DIAGNOSIS — M545 Low back pain, unspecified: Secondary | ICD-10-CM

## 2021-05-22 DIAGNOSIS — M6281 Muscle weakness (generalized): Secondary | ICD-10-CM

## 2021-05-22 DIAGNOSIS — G8929 Other chronic pain: Secondary | ICD-10-CM | POA: Diagnosis not present

## 2021-05-22 DIAGNOSIS — R262 Difficulty in walking, not elsewhere classified: Secondary | ICD-10-CM | POA: Diagnosis not present
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
## 2021-05-23 ENCOUNTER — Encounter: Payer: Self-pay | Admitting: Internal Medicine

## 2021-05-23 DIAGNOSIS — L304 Erythema intertrigo: Secondary | ICD-10-CM | POA: Diagnosis not present

## 2021-05-23 DIAGNOSIS — L570 Actinic keratosis: Secondary | ICD-10-CM | POA: Diagnosis not present

## 2021-05-23 DIAGNOSIS — C4441 Basal cell carcinoma of skin of scalp and neck: Secondary | ICD-10-CM | POA: Diagnosis not present

## 2021-05-23 DIAGNOSIS — D1801 Hemangioma of skin and subcutaneous tissue: Secondary | ICD-10-CM | POA: Diagnosis not present

## 2021-05-23 DIAGNOSIS — L821 Other seborrheic keratosis: Secondary | ICD-10-CM | POA: Diagnosis not present

## 2021-05-23 DIAGNOSIS — D225 Melanocytic nevi of trunk: Secondary | ICD-10-CM | POA: Diagnosis not present

## 2021-05-23 DIAGNOSIS — Z85828 Personal history of other malignant neoplasm of skin: Secondary | ICD-10-CM | POA: Diagnosis not present

## 2021-05-23 DIAGNOSIS — L720 Epidermal cyst: Secondary | ICD-10-CM | POA: Diagnosis not present

## 2021-05-23 DIAGNOSIS — L814 Other melanin hyperpigmentation: Secondary | ICD-10-CM | POA: Diagnosis not present

## 2021-05-31 ENCOUNTER — Encounter: Payer: Medicare Other | Admitting: Physical Therapy

## 2021-06-02 ENCOUNTER — Other Ambulatory Visit: Payer: Self-pay | Admitting: Internal Medicine

## 2021-06-02 NOTE — Telephone Encounter (Signed)
Please refill as per office routine med refill policy (all routine meds to be refilled for 3 mo or monthly (per pt preference) up to one year from last visit, then month to month grace period for 3 mo, then further med refills will have to be denied) ? ?

## 2021-06-03 ENCOUNTER — Other Ambulatory Visit: Payer: Self-pay | Admitting: Internal Medicine

## 2021-06-03 NOTE — Telephone Encounter (Signed)
Please refill as per office routine med refill policy (all routine meds to be refilled for 3 mo or monthly (per pt preference) up to one year from last visit, then month to month grace period for 3 mo, then further med refills will have to be denied) ? ?

## 2021-06-04 ENCOUNTER — Ambulatory Visit (INDEPENDENT_AMBULATORY_CARE_PROVIDER_SITE_OTHER): Payer: Medicare Other | Admitting: Physical Therapy

## 2021-06-04 ENCOUNTER — Other Ambulatory Visit: Payer: Self-pay

## 2021-06-04 ENCOUNTER — Encounter: Payer: Self-pay | Admitting: Physical Therapy

## 2021-06-04 ENCOUNTER — Encounter: Payer: Medicare Other | Admitting: Physical Therapy

## 2021-06-04 ENCOUNTER — Encounter: Payer: Self-pay | Admitting: Internal Medicine

## 2021-06-04 DIAGNOSIS — M6281 Muscle weakness (generalized): Secondary | ICD-10-CM | POA: Diagnosis not present

## 2021-06-04 DIAGNOSIS — R2681 Unsteadiness on feet: Secondary | ICD-10-CM

## 2021-06-04 DIAGNOSIS — M545 Low back pain, unspecified: Secondary | ICD-10-CM

## 2021-06-04 DIAGNOSIS — G8929 Other chronic pain: Secondary | ICD-10-CM | POA: Diagnosis not present

## 2021-06-04 DIAGNOSIS — R262 Difficulty in walking, not elsewhere classified: Secondary | ICD-10-CM | POA: Diagnosis not present

## 2021-06-04 DIAGNOSIS — R2689 Other abnormalities of gait and mobility: Secondary | ICD-10-CM | POA: Diagnosis not present

## 2021-06-04 MED ORDER — ROSUVASTATIN CALCIUM 40 MG PO TABS: 40.0000 mg | ORAL_TABLET | Freq: Every day | ORAL | 0 refills | Status: DC
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
## 2021-06-07 ENCOUNTER — Ambulatory Visit (INDEPENDENT_AMBULATORY_CARE_PROVIDER_SITE_OTHER): Payer: Medicare Other | Admitting: Physical Therapy

## 2021-06-07 ENCOUNTER — Encounter: Payer: Self-pay | Admitting: Physical Therapy

## 2021-06-07 ENCOUNTER — Other Ambulatory Visit: Payer: Self-pay

## 2021-06-07 DIAGNOSIS — R2689 Other abnormalities of gait and mobility: Secondary | ICD-10-CM

## 2021-06-07 DIAGNOSIS — R262 Difficulty in walking, not elsewhere classified: Secondary | ICD-10-CM

## 2021-06-07 DIAGNOSIS — R2681 Unsteadiness on feet: Secondary | ICD-10-CM

## 2021-06-07 DIAGNOSIS — G8929 Other chronic pain: Secondary | ICD-10-CM

## 2021-06-07 DIAGNOSIS — M6281 Muscle weakness (generalized): Secondary | ICD-10-CM

## 2021-06-07 DIAGNOSIS — M545 Low back pain, unspecified: Secondary | ICD-10-CM | POA: Diagnosis not present
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
## 2021-06-11 ENCOUNTER — Encounter: Payer: Self-pay | Admitting: Physical Therapy

## 2021-06-11 ENCOUNTER — Other Ambulatory Visit: Payer: Self-pay

## 2021-06-11 ENCOUNTER — Ambulatory Visit (INDEPENDENT_AMBULATORY_CARE_PROVIDER_SITE_OTHER): Payer: Medicare Other | Admitting: Physical Therapy

## 2021-06-11 DIAGNOSIS — M545 Low back pain, unspecified: Secondary | ICD-10-CM

## 2021-06-11 DIAGNOSIS — R2681 Unsteadiness on feet: Secondary | ICD-10-CM | POA: Diagnosis not present

## 2021-06-11 DIAGNOSIS — R2689 Other abnormalities of gait and mobility: Secondary | ICD-10-CM

## 2021-06-11 DIAGNOSIS — M6281 Muscle weakness (generalized): Secondary | ICD-10-CM | POA: Diagnosis not present

## 2021-06-11 DIAGNOSIS — G8929 Other chronic pain: Secondary | ICD-10-CM | POA: Diagnosis not present

## 2021-06-11 DIAGNOSIS — R262 Difficulty in walking, not elsewhere classified: Secondary | ICD-10-CM | POA: Diagnosis not present
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
## 2021-06-12 ENCOUNTER — Encounter: Payer: Self-pay | Admitting: Orthopaedic Surgery

## 2021-06-12 ENCOUNTER — Ambulatory Visit (INDEPENDENT_AMBULATORY_CARE_PROVIDER_SITE_OTHER): Payer: Medicare Other

## 2021-06-12 ENCOUNTER — Ambulatory Visit (INDEPENDENT_AMBULATORY_CARE_PROVIDER_SITE_OTHER): Payer: Medicare Other | Admitting: Orthopaedic Surgery

## 2021-06-12 VITALS — BP 122/70 | HR 86 | Ht 72.0 in | Wt 255.0 lb

## 2021-06-12 VITALS — BP 120/62 | HR 78 | Temp 98.2°F | Ht 71.0 in | Wt 256.6 lb

## 2021-06-12 DIAGNOSIS — Z0000 Encounter for general adult medical examination without abnormal findings: Secondary | ICD-10-CM | POA: Diagnosis not present

## 2021-06-12 DIAGNOSIS — Z9889 Other specified postprocedural states: Secondary | ICD-10-CM | POA: Diagnosis not present
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
## 2021-06-12 NOTE — Addendum Note (Signed)
Addended by: Marybelle Killings on: 06/12/2021 02:30 PM   Modules accepted: Orders

## 2021-06-12 NOTE — Patient Instructions (Signed)
Mr. Cellucci , Thank you for taking time to come for your Medicare Wellness Visit. I appreciate your ongoing commitment to your health goals. Please review the following plan we discussed and let me know if I can assist you in the future.   Screening recommendations/referrals: Colonoscopy: done 05/17/2019; due every 5 years (patient reported) Recommended yearly ophthalmology/optometry visit for glaucoma screening and checkup Recommended yearly dental visit for hygiene and checkup  Vaccinations: Influenza vaccine: 01/11/2021 Pneumococcal vaccine: 08/24/2012, 06/18/2013 Tdap vaccine: 01/08/2019; due every 10 years Shingles vaccine: 01/08/2019, 06/04/2019   Covid-19: 03/04/2019, 04/08/2019, 01/25/2020, 05/25/2020, 10/09/2020  Advanced directives: Yes; documents on file  Conditions/risks identified: My goal is to improve my distance while walking ; also to increase balance and strength.  Next appointment: Please schedule your next Medicare Wellness Visit with your Nurse Health Advisor in 1 year by calling [phone].  Preventive Care 83 Years and Older, Male Preventive care refers to lifestyle choices and visits with your health care provider that can promote health and wellness. What does preventive care include? A yearly physical exam. This is also called an annual well check. Dental exams once or twice a year. Routine eye exams. Ask your health care provider how often you should have your eyes checked. Personal lifestyle choices, including: Daily care of your teeth and gums. Regular physical activity. Eating a healthy diet. Avoiding tobacco and drug use. Limiting alcohol use. Practicing safe sex. Taking low doses of aspirin every day. Taking vitamin and mineral supplements as recommended by your health care provider. What happens during an annual well check? The services and screenings done by your health care provider during your annual well check will depend on your age, overall health,  lifestyle risk factors, and family history of disease. Counseling  Your health care provider may ask you questions about your: Alcohol use. Tobacco use. Drug use. Emotional well-being. Home and relationship well-being. Sexual activity. Eating habits. History of falls. Memory and ability to understand (cognition). Work and work Statistician. Screening  You may have the following tests or measurements: Height, weight, and BMI. Blood pressure. Lipid and cholesterol levels. These may be checked every 5 years, or more frequently if you are over 37 years old. Skin check. Lung cancer screening. You may have this screening every year starting at age 83 if you have a 30-pack-year history of smoking and currently smoke or have quit within the past 15 years. Fecal occult blood test (FOBT) of the stool. You may have this test every year starting at age 63. Flexible sigmoidoscopy or colonoscopy. You may have a sigmoidoscopy every 5 years or a colonoscopy every 10 years starting at age 47. Prostate cancer screening. Recommendations will vary depending on your family history and other risks. Hepatitis C blood test. Hepatitis B blood test. Sexually transmitted disease (STD) testing. Diabetes screening. This is done by checking your blood sugar (glucose) after you have not eaten for a while (fasting). You may have this done every 1-3 years. Abdominal aortic aneurysm (AAA) screening. You may need this if you are a current or former smoker. Osteoporosis. You may be screened starting at age 73 if you are at high risk. Talk with your health care provider about your test results, treatment options, and if necessary, the need for more tests. Vaccines  Your health care provider may recommend certain vaccines, such as: Influenza vaccine. This is recommended every year. Tetanus, diphtheria, and acellular pertussis (Tdap, Td) vaccine. You may need a Td booster every 10 years. Zoster vaccine.  You may need this  after age 53. Pneumococcal 13-valent conjugate (PCV13) vaccine. One dose is recommended after age 72. Pneumococcal polysaccharide (PPSV23) vaccine. One dose is recommended after age 40. Talk to your health care provider about which screenings and vaccines you need and how often you need them. This information is not intended to replace advice given to you by your health care provider. Make sure you discuss any questions you have with your health care provider. Document Released: 06/09/2015 Document Revised: 01/31/2016 Document Reviewed: 03/14/2015 Elsevier Interactive Patient Education  2017 [place] Prevention in the Home Falls can cause injuries. They can happen to people of all ages. There are many things you can do to make your home safe and to help prevent falls. What can I do on the outside of my home? Regularly fix the edges of walkways and driveways and fix any cracks. Remove anything that might make you trip as you walk through a door, such as a raised step or threshold. Trim any bushes or trees on the path to your home. Use bright outdoor lighting. Clear any walking paths of anything that might make someone trip, such as rocks or tools. Regularly check to see if handrails are loose or broken. Make sure that both sides of any steps have handrails. Any raised decks and porches should have guardrails on the edges. Have any leaves, snow, or ice cleared regularly. Use sand or salt on walking paths during winter. Clean up any spills in your garage right away. This includes oil or grease spills. What can I do in the bathroom? Use night lights. Install grab bars by the toilet and in the tub and shower. Do not use towel bars as grab bars. Use non-skid mats or decals in the tub or shower. If you need to sit down in the shower, use a plastic, non-slip stool. Keep the floor dry. Clean up any water that spills on the floor as soon as it happens. Remove soap buildup in the tub or  shower regularly. Attach bath mats securely with double-sided non-slip rug tape. Do not have throw rugs and other things on the floor that can make you trip. What can I do in the bedroom? Use night lights. Make sure that you have a light by your bed that is easy to reach. Do not use any sheets or blankets that are too big for your bed. They should not hang down onto the floor. Have a firm chair that has side arms. You can use this for support while you get dressed. Do not have throw rugs and other things on the floor that can make you trip. What can I do in the kitchen? Clean up any spills right away. Avoid walking on wet floors. Keep items that you use a lot in easy-to-reach places. If you need to reach something above you, use a strong step stool that has a grab bar. Keep electrical cords out of the way. Do not use floor polish or wax that makes floors slippery. If you must use wax, use non-skid floor wax. Do not have throw rugs and other things on the floor that can make you trip. What can I do with my stairs? Do not leave any items on the stairs. Make sure that there are handrails on both sides of the stairs and use them. Fix handrails that are broken or loose. Make sure that handrails are as long as the stairways. Check any carpeting to make sure that it is  firmly attached to the stairs. Fix any carpet that is loose or worn. Avoid having throw rugs at the top or bottom of the stairs. If you do have throw rugs, attach them to the floor with carpet tape. Make sure that you have a light switch at the top of the stairs and the bottom of the stairs. If you do not have them, ask someone to add them for you. What else can I do to help prevent falls? Wear shoes that: Do not have high heels. Have rubber bottoms. Are comfortable and fit you well. Are closed at the toe. Do not wear sandals. If you use a stepladder: Make sure that it is fully opened. Do not climb a closed stepladder. Make  sure that both sides of the stepladder are locked into place. Ask someone to hold it for you, if possible. Clearly mark and make sure that you can see: Any grab bars or handrails. First and last steps. Where the edge of each step is. Use tools that help you move around (mobility aids) if they are needed. These include: Canes. Walkers. Scooters. Crutches. Turn on the lights when you go into a dark area. Replace any light bulbs as soon as they burn out. Set up your furniture so you have a clear path. Avoid moving your furniture around. If any of your floors are uneven, fix them. If there are any pets around you, be aware of where they are. Review your medicines with your doctor. Some medicines can make you feel dizzy. This can increase your chance of falling. Ask your doctor what other things that you can do to help prevent falls. This information is not intended to replace advice given to you by your health care provider. Make sure you discuss any questions you have with your health care provider. Document Released: 03/09/2009 Document Revised: 10/19/2015 Document Reviewed: 06/17/2014 Elsevier Interactive Patient Education  2017 [employer].
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
## 2021-06-14 ENCOUNTER — Other Ambulatory Visit: Payer: Self-pay

## 2021-06-14 ENCOUNTER — Ambulatory Visit (INDEPENDENT_AMBULATORY_CARE_PROVIDER_SITE_OTHER): Payer: Medicare Other | Admitting: Physical Therapy

## 2021-06-14 ENCOUNTER — Encounter: Payer: Self-pay | Admitting: Physical Therapy

## 2021-06-14 DIAGNOSIS — M6281 Muscle weakness (generalized): Secondary | ICD-10-CM

## 2021-06-14 DIAGNOSIS — R262 Difficulty in walking, not elsewhere classified: Secondary | ICD-10-CM

## 2021-06-14 DIAGNOSIS — R2689 Other abnormalities of gait and mobility: Secondary | ICD-10-CM

## 2021-06-14 DIAGNOSIS — G8929 Other chronic pain: Secondary | ICD-10-CM

## 2021-06-14 DIAGNOSIS — R2681 Unsteadiness on feet: Secondary | ICD-10-CM

## 2021-06-14 DIAGNOSIS — M545 Low back pain, unspecified: Secondary | ICD-10-CM
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
## 2021-06-18 ENCOUNTER — Encounter: Payer: Medicare Other | Admitting: Physical Therapy

## 2021-06-19 ENCOUNTER — Encounter: Payer: Self-pay | Admitting: Internal Medicine

## 2021-06-20 ENCOUNTER — Encounter: Payer: Medicare Other | Admitting: Physical Therapy

## 2021-06-25 ENCOUNTER — Ambulatory Visit (INDEPENDENT_AMBULATORY_CARE_PROVIDER_SITE_OTHER): Payer: Medicare Other | Admitting: Rehabilitative and Restorative Service Providers"

## 2021-06-25 ENCOUNTER — Other Ambulatory Visit: Payer: Self-pay

## 2021-06-25 ENCOUNTER — Encounter: Payer: Self-pay | Admitting: Rehabilitative and Restorative Service Providers"

## 2021-06-25 DIAGNOSIS — R2689 Other abnormalities of gait and mobility: Secondary | ICD-10-CM | POA: Diagnosis not present

## 2021-06-25 DIAGNOSIS — R262 Difficulty in walking, not elsewhere classified: Secondary | ICD-10-CM

## 2021-06-25 DIAGNOSIS — G8929 Other chronic pain: Secondary | ICD-10-CM

## 2021-06-25 DIAGNOSIS — R2681 Unsteadiness on feet: Secondary | ICD-10-CM | POA: Diagnosis not present

## 2021-06-25 DIAGNOSIS — M545 Low back pain, unspecified: Secondary | ICD-10-CM | POA: Diagnosis not present

## 2021-06-25 DIAGNOSIS — M6281 Muscle weakness (generalized): Secondary | ICD-10-CM | POA: Diagnosis not present
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
## 2021-07-02 ENCOUNTER — Encounter: Payer: Self-pay | Admitting: Physical Therapy

## 2021-07-02 ENCOUNTER — Other Ambulatory Visit: Payer: Self-pay

## 2021-07-02 ENCOUNTER — Ambulatory Visit (INDEPENDENT_AMBULATORY_CARE_PROVIDER_SITE_OTHER): Payer: Medicare Other | Admitting: Physical Therapy

## 2021-07-02 DIAGNOSIS — R262 Difficulty in walking, not elsewhere classified: Secondary | ICD-10-CM

## 2021-07-02 DIAGNOSIS — R2689 Other abnormalities of gait and mobility: Secondary | ICD-10-CM | POA: Diagnosis not present

## 2021-07-02 DIAGNOSIS — G8929 Other chronic pain: Secondary | ICD-10-CM

## 2021-07-02 DIAGNOSIS — R2681 Unsteadiness on feet: Secondary | ICD-10-CM

## 2021-07-02 DIAGNOSIS — M545 Low back pain, unspecified: Secondary | ICD-10-CM | POA: Diagnosis not present

## 2021-07-02 DIAGNOSIS — M6281 Muscle weakness (generalized): Secondary | ICD-10-CM
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
## 2021-07-03 DIAGNOSIS — H43813 Vitreous degeneration, bilateral: Secondary | ICD-10-CM | POA: Diagnosis not present

## 2021-07-03 DIAGNOSIS — H5213 Myopia, bilateral: Secondary | ICD-10-CM | POA: Diagnosis not present

## 2021-07-03 DIAGNOSIS — H25013 Cortical age-related cataract, bilateral: Secondary | ICD-10-CM | POA: Diagnosis not present

## 2021-07-03 DIAGNOSIS — H2513 Age-related nuclear cataract, bilateral: Secondary | ICD-10-CM | POA: Diagnosis not present

## 2021-07-04 ENCOUNTER — Other Ambulatory Visit (INDEPENDENT_AMBULATORY_CARE_PROVIDER_SITE_OTHER): Payer: Self-pay | Admitting: Vascular Surgery

## 2021-07-04 ENCOUNTER — Ambulatory Visit (INDEPENDENT_AMBULATORY_CARE_PROVIDER_SITE_OTHER): Payer: Medicare Other | Admitting: Physical Therapy

## 2021-07-04 ENCOUNTER — Encounter: Payer: Self-pay | Admitting: Physical Therapy

## 2021-07-04 ENCOUNTER — Other Ambulatory Visit: Payer: Self-pay

## 2021-07-04 DIAGNOSIS — R262 Difficulty in walking, not elsewhere classified: Secondary | ICD-10-CM | POA: Diagnosis not present

## 2021-07-04 DIAGNOSIS — M6281 Muscle weakness (generalized): Secondary | ICD-10-CM

## 2021-07-04 DIAGNOSIS — R2689 Other abnormalities of gait and mobility: Secondary | ICD-10-CM

## 2021-07-04 DIAGNOSIS — I739 Peripheral vascular disease, unspecified: Secondary | ICD-10-CM

## 2021-07-04 DIAGNOSIS — G8929 Other chronic pain: Secondary | ICD-10-CM

## 2021-07-04 DIAGNOSIS — M545 Low back pain, unspecified: Secondary | ICD-10-CM | POA: Diagnosis not present

## 2021-07-04 DIAGNOSIS — Z9889 Other specified postprocedural states: Secondary | ICD-10-CM

## 2021-07-04 DIAGNOSIS — R2681 Unsteadiness on feet: Secondary | ICD-10-CM | POA: Diagnosis not present
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
## 2021-07-05 ENCOUNTER — Ambulatory Visit (INDEPENDENT_AMBULATORY_CARE_PROVIDER_SITE_OTHER): Payer: Medicare Other

## 2021-07-05 ENCOUNTER — Ambulatory Visit (INDEPENDENT_AMBULATORY_CARE_PROVIDER_SITE_OTHER): Payer: Medicare Other | Admitting: Nurse Practitioner

## 2021-07-05 ENCOUNTER — Encounter (INDEPENDENT_AMBULATORY_CARE_PROVIDER_SITE_OTHER): Payer: Self-pay | Admitting: Vascular Surgery

## 2021-07-05 VITALS — BP 118/74 | HR 85 | Ht 72.0 in | Wt 258.0 lb

## 2021-07-05 DIAGNOSIS — I10 Essential (primary) hypertension: Secondary | ICD-10-CM

## 2021-07-05 DIAGNOSIS — I739 Peripheral vascular disease, unspecified: Secondary | ICD-10-CM

## 2021-07-05 DIAGNOSIS — I70213 Atherosclerosis of native arteries of extremities with intermittent claudication, bilateral legs: Secondary | ICD-10-CM

## 2021-07-05 DIAGNOSIS — Z9889 Other specified postprocedural states: Secondary | ICD-10-CM

## 2021-07-05 DIAGNOSIS — M519 Unspecified thoracic, thoracolumbar and lumbosacral intervertebral disc disorder: Secondary | ICD-10-CM | POA: Diagnosis not present

## 2021-07-05 DIAGNOSIS — E782 Mixed hyperlipidemia: Secondary | ICD-10-CM | POA: Diagnosis not present

## 2021-07-09 ENCOUNTER — Other Ambulatory Visit: Payer: Self-pay

## 2021-07-09 ENCOUNTER — Ambulatory Visit (INDEPENDENT_AMBULATORY_CARE_PROVIDER_SITE_OTHER): Payer: Medicare Other | Admitting: Physical Therapy

## 2021-07-09 ENCOUNTER — Encounter: Payer: Self-pay | Admitting: Physical Therapy

## 2021-07-09 DIAGNOSIS — M545 Low back pain, unspecified: Secondary | ICD-10-CM | POA: Diagnosis not present

## 2021-07-09 DIAGNOSIS — R2689 Other abnormalities of gait and mobility: Secondary | ICD-10-CM | POA: Diagnosis not present

## 2021-07-09 DIAGNOSIS — R2681 Unsteadiness on feet: Secondary | ICD-10-CM

## 2021-07-09 DIAGNOSIS — G8929 Other chronic pain: Secondary | ICD-10-CM | POA: Diagnosis not present

## 2021-07-09 DIAGNOSIS — M6281 Muscle weakness (generalized): Secondary | ICD-10-CM | POA: Diagnosis not present
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
## 2021-07-11 ENCOUNTER — Encounter: Payer: Medicare Other | Admitting: Physical Therapy

## 2021-07-15 ENCOUNTER — Encounter (INDEPENDENT_AMBULATORY_CARE_PROVIDER_SITE_OTHER): Payer: Self-pay | Admitting: Nurse Practitioner

## 2021-07-17 ENCOUNTER — Encounter: Payer: Medicare Other | Admitting: Physical Therapy

## 2021-07-19 ENCOUNTER — Ambulatory Visit: Payer: Medicare Other | Admitting: Internal Medicine

## 2021-07-19 ENCOUNTER — Ambulatory Visit (INDEPENDENT_AMBULATORY_CARE_PROVIDER_SITE_OTHER): Payer: Medicare Other | Admitting: Physical Therapy

## 2021-07-19 ENCOUNTER — Other Ambulatory Visit: Payer: Self-pay

## 2021-07-19 ENCOUNTER — Ambulatory Visit (INDEPENDENT_AMBULATORY_CARE_PROVIDER_SITE_OTHER): Payer: Medicare Other | Admitting: Internal Medicine

## 2021-07-19 ENCOUNTER — Ambulatory Visit (INDEPENDENT_AMBULATORY_CARE_PROVIDER_SITE_OTHER): Payer: Medicare Other

## 2021-07-19 ENCOUNTER — Encounter: Payer: Self-pay | Admitting: Internal Medicine

## 2021-07-19 ENCOUNTER — Encounter: Payer: Self-pay | Admitting: Physical Therapy

## 2021-07-19 VITALS — BP 112/60 | HR 75 | Temp 98.2°F | Ht 72.0 in | Wt 257.0 lb

## 2021-07-19 DIAGNOSIS — M6281 Muscle weakness (generalized): Secondary | ICD-10-CM | POA: Diagnosis not present

## 2021-07-19 DIAGNOSIS — R262 Difficulty in walking, not elsewhere classified: Secondary | ICD-10-CM | POA: Diagnosis not present

## 2021-07-19 DIAGNOSIS — I517 Cardiomegaly: Secondary | ICD-10-CM | POA: Diagnosis not present

## 2021-07-19 DIAGNOSIS — R7303 Prediabetes: Secondary | ICD-10-CM | POA: Diagnosis not present

## 2021-07-19 DIAGNOSIS — R0600 Dyspnea, unspecified: Secondary | ICD-10-CM | POA: Diagnosis not present

## 2021-07-19 DIAGNOSIS — G8929 Other chronic pain: Secondary | ICD-10-CM | POA: Diagnosis not present

## 2021-07-19 DIAGNOSIS — R972 Elevated prostate specific antigen [PSA]: Secondary | ICD-10-CM

## 2021-07-19 DIAGNOSIS — E538 Deficiency of other specified B group vitamins: Secondary | ICD-10-CM | POA: Diagnosis not present

## 2021-07-19 DIAGNOSIS — I10 Essential (primary) hypertension: Secondary | ICD-10-CM

## 2021-07-19 DIAGNOSIS — E559 Vitamin D deficiency, unspecified: Secondary | ICD-10-CM | POA: Diagnosis not present

## 2021-07-19 DIAGNOSIS — I70213 Atherosclerosis of native arteries of extremities with intermittent claudication, bilateral legs: Secondary | ICD-10-CM

## 2021-07-19 DIAGNOSIS — R400 Somnolence: Secondary | ICD-10-CM

## 2021-07-19 DIAGNOSIS — R2681 Unsteadiness on feet: Secondary | ICD-10-CM

## 2021-07-19 DIAGNOSIS — N183 Chronic kidney disease, stage 3 unspecified: Secondary | ICD-10-CM | POA: Diagnosis not present

## 2021-07-19 DIAGNOSIS — R739 Hyperglycemia, unspecified: Secondary | ICD-10-CM

## 2021-07-19 DIAGNOSIS — R0602 Shortness of breath: Secondary | ICD-10-CM | POA: Diagnosis not present

## 2021-07-19 DIAGNOSIS — R2689 Other abnormalities of gait and mobility: Secondary | ICD-10-CM | POA: Diagnosis not present

## 2021-07-19 DIAGNOSIS — R0609 Other forms of dyspnea: Secondary | ICD-10-CM | POA: Insufficient documentation

## 2021-07-19 DIAGNOSIS — M545 Low back pain, unspecified: Secondary | ICD-10-CM

## 2021-07-19 DIAGNOSIS — G2581 Restless legs syndrome: Secondary | ICD-10-CM | POA: Diagnosis not present

## 2021-07-19 DIAGNOSIS — E782 Mixed hyperlipidemia: Secondary | ICD-10-CM

## 2021-07-19 LAB — URINALYSIS, ROUTINE W REFLEX MICROSCOPIC
Bilirubin Urine: NEGATIVE
Hgb urine dipstick: NEGATIVE
Leukocytes,Ua: NEGATIVE
Nitrite: NEGATIVE
RBC / HPF: NONE SEEN (ref 0–?)
Specific Gravity, Urine: 1.02 (ref 1.000–1.030)
Total Protein, Urine: NEGATIVE
Urine Glucose: NEGATIVE
Urobilinogen, UA: 1 (ref 0.0–1.0)
pH: 6 (ref 5.0–8.0)

## 2021-07-19 LAB — LIPID PANEL
Cholesterol: 196 mg/dL (ref 0–200)
HDL: 57.6 mg/dL (ref >39.00)
NonHDL: 138.76
Total CHOL/HDL Ratio: 3
Triglycerides: 229 mg/dL — ABNORMAL HIGH (ref 0.0–149.0)
VLDL: 45.8 mg/dL — ABNORMAL HIGH (ref 0.0–40.0)

## 2021-07-19 LAB — CBC WITH DIFFERENTIAL/PLATELET
Basophils Absolute: 0 10*3/uL (ref 0.0–0.1)
Basophils Relative: 0.6 % (ref 0.0–3.0)
Eosinophils Absolute: 0.2 10*3/uL (ref 0.0–0.7)
Eosinophils Relative: 3 % (ref 0.0–5.0)
HCT: 35.1 % — ABNORMAL LOW (ref 39.0–52.0)
Hemoglobin: 12.1 g/dL — ABNORMAL LOW (ref 13.0–17.0)
Lymphocytes Relative: 36.6 % (ref 12.0–46.0)
Lymphs Abs: 2.4 10*3/uL (ref 0.7–4.0)
MCHC: 34.4 g/dL (ref 30.0–36.0)
MCV: 89.1 fl (ref 78.0–100.0)
Monocytes Absolute: 0.6 10*3/uL (ref 0.1–1.0)
Monocytes Relative: 9.1 % (ref 3.0–12.0)
Neutro Abs: 3.3 10*3/uL (ref 1.4–7.7)
Neutrophils Relative %: 50.7 % (ref 43.0–77.0)
Platelets: 189 10*3/uL (ref 150.0–400.0)
RBC: 3.93 Mil/uL — ABNORMAL LOW (ref 4.22–5.81)
RDW: 14.7 % (ref 11.5–15.5)
WBC: 6.5 10*3/uL (ref 4.0–10.5)

## 2021-07-19 LAB — HEPATIC FUNCTION PANEL
ALT: 18 U/L (ref 0–53)
AST: 15 U/L (ref 0–37)
Albumin: 4 g/dL (ref 3.5–5.2)
Alkaline Phosphatase: 112 U/L (ref 39–117)
Bilirubin, Direct: 0.1 mg/dL (ref 0.0–0.3)
Total Bilirubin: 0.5 mg/dL (ref 0.2–1.2)
Total Protein: 6.6 g/dL (ref 6.0–8.3)

## 2021-07-19 LAB — VITAMIN D 25 HYDROXY (VIT D DEFICIENCY, FRACTURES): VITD: 36.18 ng/mL (ref 30.00–100.00)

## 2021-07-19 LAB — BRAIN NATRIURETIC PEPTIDE: Pro B Natriuretic peptide (BNP): 30 pg/mL (ref 0.0–100.0)

## 2021-07-19 LAB — PSA: PSA: 0.41 ng/mL (ref 0.10–4.00)

## 2021-07-19 LAB — LDL CHOLESTEROL, DIRECT: Direct LDL: 118 mg/dL

## 2021-07-19 LAB — VITAMIN B12: Vitamin B-12: 1226 pg/mL — ABNORMAL HIGH (ref 211–911)

## 2021-07-19 LAB — BASIC METABOLIC PANEL
BUN: 23 mg/dL (ref 6–23)
CO2: 27 mEq/L (ref 19–32)
Calcium: 9.1 mg/dL (ref 8.4–10.5)
Chloride: 109 mEq/L (ref 96–112)
Creatinine, Ser: 1.69 mg/dL — ABNORMAL HIGH (ref 0.40–1.50)
GFR: 38.08 mL/min — ABNORMAL LOW (ref >60.00)
Glucose, Bld: 80 mg/dL (ref 70–99)
Potassium: 4.4 mEq/L (ref 3.5–5.1)
Sodium: 143 mEq/L (ref 135–145)

## 2021-07-19 LAB — TSH: TSH: 2.2 u[IU]/mL (ref 0.35–5.50)

## 2021-07-19 LAB — HEMOGLOBIN A1C: Hgb A1c MFr Bld: 6.2 % (ref 4.6–6.5)

## 2021-07-19 MED ORDER — ROSUVASTATIN CALCIUM 40 MG PO TABS: 40.0000 mg | ORAL_TABLET | Freq: Every day | ORAL | 3 refills | Status: DC

## 2021-07-19 NOTE — Patient Instructions (Signed)
Please continue all other medications as before, including the rosuvastatin (crestor) but not the simvastatin  Please have the pharmacy call with any other refills you may need.  Please continue your efforts at being more active, low cholesterol diet, and weight control.  You are otherwise up to date with prevention measures today.  Please keep your appointments with your specialists as you may have planned  You will be contacted regarding the referral for: PFT's (pulmonary function testing) with the pulmonary office  Please go to the XRAY Department in the first floor for the x-ray testing  Please go to the LAB at the blood drawing area for the tests to be done  Please make an Appointment to return in 6 months, or sooner if needed

## 2021-07-19 NOTE — Patient Instructions (Signed)
Access Code: [email or web address] URL: [email or web address] Date: 07/19/2021 Prepared by: Faustino Congress  Exercises Supine Bridge - 2 x daily - 7 x weekly - 2 sets - 10 reps - 3 seconds hold Hooklying Single Knee to Chest Stretch - 2 x daily - 7 x weekly - 3 reps - 15-20 seconds hold Supine Lower Trunk Rotation - 2 x daily - 7 x weekly - 3 reps - 15-20 seconds hold Standing Hip Abduction with Counter Support - 2 x daily - 7 x weekly - 2 sets - 10 reps Seated Heel Raise - 1 x daily - 7 x weekly - 3 sets - 10 reps - 3 seconds hold Standing Hip Extension with Counter Support - 1 x daily - 7 x weekly - 1 sets - 20 reps Mini Squat with Counter Support - 1 x daily - 7 x weekly - 1 sets - 20 reps Side Lunge with Counter Support - 1 x daily - 7 x weekly - 1 sets - 20 reps
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
## 2021-07-20 LAB — D-DIMER, QUANTITATIVE: D-Dimer, Quant: 0.78 mcg/mL FEU — ABNORMAL HIGH (ref <0.50)

## 2021-07-21 ENCOUNTER — Encounter: Payer: Self-pay | Admitting: Internal Medicine

## 2021-07-21 DIAGNOSIS — E559 Vitamin D deficiency, unspecified: Secondary | ICD-10-CM | POA: Insufficient documentation

## 2021-07-21 NOTE — Assessment & Plan Note (Signed)
Etiology unclear, cont to follow fitbit sats at home

## 2021-07-21 NOTE — Assessment & Plan Note (Signed)
Last vitamin D Lab Results  Component Value Date   VD25OH 36.18 07/19/2021  low, to start oral replacement

## 2021-07-21 NOTE — Assessment & Plan Note (Signed)
Lab Results  Component Value Date   CREATININE 1.69 (H) 07/19/2021   Stable overall, cont to avoid nephrotoxins

## 2021-07-21 NOTE — Assessment & Plan Note (Signed)
?   Restrictive lung dz - for labs as ordered PFTs, and cxr

## 2021-07-21 NOTE — Assessment & Plan Note (Signed)
Lab Results  Component Value Date   HGBA1C 6.2 07/19/2021   Stable, pt to continue current medical treatment  - diet

## 2021-07-21 NOTE — Assessment & Plan Note (Signed)
Stable, for med refill 

## 2021-07-21 NOTE — Assessment & Plan Note (Signed)
BP Readings from Last 3 Encounters:  07/19/21 112/60  07/05/21 118/74  06/12/21 120/62   Stable, pt to continue medical treatment norvasc, losartan

## 2021-07-21 NOTE — Assessment & Plan Note (Signed)
Lab Results  Component Value Date   PSA 0.41 07/19/2021   PSA 1.38 01/10/2021   PSA 2.11 01/06/2020    Stable, cont to follow

## 2021-07-21 NOTE — Assessment & Plan Note (Signed)
Lab Results  Component Value Date   [place] 75 01/10/2021   Uncontrolled, goal ldl < 70,, pt to continue current statin crestor 40, declines add zetia for now

## 2021-07-22 ENCOUNTER — Other Ambulatory Visit: Payer: Self-pay | Admitting: Internal Medicine

## 2021-07-22 DIAGNOSIS — R0600 Dyspnea, unspecified: Secondary | ICD-10-CM

## 2021-07-26 ENCOUNTER — Ambulatory Visit (HOSPITAL_COMMUNITY)
Admission: RE | Admit: 2021-07-26 | Discharge: 2021-07-26 | Disposition: A | Discharge status: Home / Self Care | Payer: Medicare Other | Source: Ambulatory Visit | Attending: Internal Medicine | Admitting: Internal Medicine

## 2021-07-26 ENCOUNTER — Other Ambulatory Visit: Payer: Self-pay

## 2021-07-26 ENCOUNTER — Ambulatory Visit (HOSPITAL_COMMUNITY): Admission: RE | Admit: 2021-07-26 | Payer: Medicare Other | Source: Ambulatory Visit

## 2021-07-26 ENCOUNTER — Encounter (HOSPITAL_COMMUNITY): Payer: Self-pay

## 2021-07-26 DIAGNOSIS — I358 Other nonrheumatic aortic valve disorders: Secondary | ICD-10-CM | POA: Insufficient documentation

## 2021-07-26 DIAGNOSIS — I340 Nonrheumatic mitral (valve) insufficiency: Secondary | ICD-10-CM | POA: Diagnosis not present

## 2021-07-26 DIAGNOSIS — I3481 Nonrheumatic mitral (valve) annulus calcification: Secondary | ICD-10-CM | POA: Diagnosis not present

## 2021-07-26 DIAGNOSIS — R0609 Other forms of dyspnea: Secondary | ICD-10-CM

## 2021-07-26 DIAGNOSIS — E785 Hyperlipidemia, unspecified: Secondary | ICD-10-CM | POA: Insufficient documentation

## 2021-07-26 DIAGNOSIS — R0600 Dyspnea, unspecified: Secondary | ICD-10-CM | POA: Diagnosis not present

## 2021-07-26 LAB — ECHOCARDIOGRAM COMPLETE
Area-P 1/2: 3.31 cm2
S' Lateral: 3.9 cm

## 2021-08-30 ENCOUNTER — Telehealth (INDEPENDENT_AMBULATORY_CARE_PROVIDER_SITE_OTHER): Payer: Medicare Other | Admitting: Family

## 2021-08-30 ENCOUNTER — Encounter: Payer: Self-pay | Admitting: Family

## 2021-08-30 VITALS — Ht 72.0 in | Wt 257.0 lb

## 2021-08-30 DIAGNOSIS — U071 COVID-19: Secondary | ICD-10-CM | POA: Diagnosis not present

## 2021-08-30 MED ORDER — MOLNUPIRAVIR EUA 200MG CAPSULE: 4.0000 | ORAL_CAPSULE | Freq: Two times a day (BID) | ORAL | 0 refills | Status: AC
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
## 2021-09-02 ENCOUNTER — Other Ambulatory Visit: Payer: Self-pay | Admitting: Internal Medicine

## 2021-10-17 DIAGNOSIS — N1832 Chronic kidney disease, stage 3b: Secondary | ICD-10-CM | POA: Diagnosis not present

## 2021-10-17 DIAGNOSIS — I129 Hypertensive chronic kidney disease with stage 1 through stage 4 chronic kidney disease, or unspecified chronic kidney disease: Secondary | ICD-10-CM | POA: Diagnosis not present

## 2021-11-11 ENCOUNTER — Other Ambulatory Visit: Payer: Self-pay | Admitting: Internal Medicine

## 2021-11-23 ENCOUNTER — Other Ambulatory Visit (INDEPENDENT_AMBULATORY_CARE_PROVIDER_SITE_OTHER): Payer: Self-pay | Admitting: Vascular Surgery

## 2021-12-10 ENCOUNTER — Other Ambulatory Visit (INDEPENDENT_AMBULATORY_CARE_PROVIDER_SITE_OTHER): Payer: Self-pay | Admitting: Vascular Surgery

## 2022-01-18 ENCOUNTER — Ambulatory Visit (INDEPENDENT_AMBULATORY_CARE_PROVIDER_SITE_OTHER): Payer: Medicare Other

## 2022-01-18 ENCOUNTER — Encounter: Payer: Self-pay | Admitting: Internal Medicine

## 2022-01-18 ENCOUNTER — Ambulatory Visit (INDEPENDENT_AMBULATORY_CARE_PROVIDER_SITE_OTHER): Payer: Medicare Other | Admitting: Internal Medicine

## 2022-01-18 VITALS — BP 140/60 | HR 75 | Temp 98.2°F | Ht 72.0 in | Wt 261.8 lb

## 2022-01-18 DIAGNOSIS — M533 Sacrococcygeal disorders, not elsewhere classified: Secondary | ICD-10-CM | POA: Diagnosis not present

## 2022-01-18 DIAGNOSIS — E559 Vitamin D deficiency, unspecified: Secondary | ICD-10-CM

## 2022-01-18 DIAGNOSIS — E782 Mixed hyperlipidemia: Secondary | ICD-10-CM

## 2022-01-18 DIAGNOSIS — R7303 Prediabetes: Secondary | ICD-10-CM

## 2022-01-18 DIAGNOSIS — I70213 Atherosclerosis of native arteries of extremities with intermittent claudication, bilateral legs: Secondary | ICD-10-CM

## 2022-01-18 DIAGNOSIS — N183 Chronic kidney disease, stage 3 unspecified: Secondary | ICD-10-CM | POA: Diagnosis not present

## 2022-01-18 LAB — HEPATIC FUNCTION PANEL
ALT: 16 U/L (ref 0–53)
AST: 14 U/L (ref 0–37)
Albumin: 4.1 g/dL (ref 3.5–5.2)
Alkaline Phosphatase: 116 U/L (ref 39–117)
Bilirubin, Direct: 0.1 mg/dL (ref 0.0–0.3)
Total Bilirubin: 0.4 mg/dL (ref 0.2–1.2)
Total Protein: 6.8 g/dL (ref 6.0–8.3)

## 2022-01-18 LAB — LIPID PANEL
Cholesterol: 149 mg/dL (ref 0–200)
HDL: 52.5 mg/dL (ref >39.00)
NonHDL: 96.89
Total CHOL/HDL Ratio: 3
Triglycerides: 219 mg/dL — ABNORMAL HIGH (ref 0.0–149.0)
VLDL: 43.8 mg/dL — ABNORMAL HIGH (ref 0.0–40.0)

## 2022-01-18 LAB — BASIC METABOLIC PANEL
BUN: 22 mg/dL (ref 6–23)
CO2: 26 mEq/L (ref 19–32)
Calcium: 9 mg/dL (ref 8.4–10.5)
Chloride: 108 mEq/L (ref 96–112)
Creatinine, Ser: 1.91 mg/dL — ABNORMAL HIGH (ref 0.40–1.50)
GFR: 32.76 mL/min — ABNORMAL LOW (ref >60.00)
Glucose, Bld: 124 mg/dL — ABNORMAL HIGH (ref 70–99)
Potassium: 4.4 mEq/L (ref 3.5–5.1)
Sodium: 142 mEq/L (ref 135–145)

## 2022-01-18 LAB — LDL CHOLESTEROL, DIRECT: Direct LDL: 75 mg/dL

## 2022-01-18 LAB — HEMOGLOBIN A1C: Hgb A1c MFr Bld: 6.5 % (ref 4.6–6.5)

## 2022-01-18 MED ORDER — EZETIMIBE 10 MG PO TABS: 10.0000 mg | ORAL_TABLET | Freq: Every day | ORAL | 3 refills | Status: DC
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
## 2022-01-18 NOTE — Patient Instructions (Signed)
Please continue all other medications as before, and refills have been done if requested.  Please have the pharmacy call with any other refills you may need.  Please keep your appointments with your specialists as you may have planned  Please go to the XRAY Department in the first floor for the x-ray testing  Please go to the LAB at the blood drawing area for the tests to be done  You will be contacted by phone if any changes need to be made immediately.  Otherwise, you will receive a letter about your results with an explanation, but please check with MyChart first.  Please remember to sign up for MyChart if you have not done so, as this will be important to you in the future with finding out test results, communicating by private email, and scheduling acute appointments online when needed.  Please make an Appointment to return in 6 months, or sooner if needed

## 2022-01-22 ENCOUNTER — Encounter: Payer: Self-pay | Admitting: Internal Medicine

## 2022-01-22 DIAGNOSIS — M533 Sacrococcygeal disorders, not elsewhere classified: Secondary | ICD-10-CM | POA: Insufficient documentation

## 2022-01-22 NOTE — Assessment & Plan Note (Signed)
Lab Results  Component Value Date   CREATININE 1.91 (H) 01/18/2022   Stable overall, cont to avoid nephrotoxins

## 2022-01-22 NOTE — Assessment & Plan Note (Signed)
Lab Results  Component Value Date   HGBA1C 6.5 01/18/2022   Stable, pt to continue current medical treatment  - diet, excercise

## 2022-01-22 NOTE — Assessment & Plan Note (Signed)
Last vitamin D Lab Results  Component Value Date   VD25OH 36.18 07/19/2021   Low, reminded to start oral replacement  

## 2022-01-22 NOTE — Assessment & Plan Note (Signed)
Lab Results  Component Value Date   LDLCALC 75 01/10/2021   Mild uncontrolled, goal ldl < 70, pt to continue current statin zetia 10 mg qd

## 2022-01-22 NOTE — Assessment & Plan Note (Signed)
S/p fall - for xray, pain control,  to f/u any worsening symptoms or concerns

## 2022-02-18 DIAGNOSIS — Z23 Encounter for immunization: Secondary | ICD-10-CM | POA: Diagnosis not present

## 2022-02-22 ENCOUNTER — Other Ambulatory Visit: Payer: Self-pay

## 2022-02-22 ENCOUNTER — Emergency Department (HOSPITAL_BASED_OUTPATIENT_CLINIC_OR_DEPARTMENT_OTHER): Payer: Medicare Other | Admitting: Radiology

## 2022-02-22 ENCOUNTER — Encounter (HOSPITAL_BASED_OUTPATIENT_CLINIC_OR_DEPARTMENT_OTHER): Payer: Self-pay | Admitting: *Deleted

## 2022-02-22 ENCOUNTER — Emergency Department (HOSPITAL_BASED_OUTPATIENT_CLINIC_OR_DEPARTMENT_OTHER)
Admission: EM | Admit: 2022-02-22 | Discharge: 2022-02-22 | Disposition: A | Discharge status: Home / Self Care | Payer: Medicare Other | Attending: Emergency Medicine | Admitting: Emergency Medicine

## 2022-02-22 ENCOUNTER — Telehealth: Payer: Self-pay

## 2022-02-22 DIAGNOSIS — S82041A Displaced comminuted fracture of right patella, initial encounter for closed fracture: Secondary | ICD-10-CM | POA: Diagnosis not present

## 2022-02-22 DIAGNOSIS — I493 Ventricular premature depolarization: Secondary | ICD-10-CM | POA: Diagnosis not present

## 2022-02-22 DIAGNOSIS — S82031A Displaced transverse fracture of right patella, initial encounter for closed fracture: Secondary | ICD-10-CM | POA: Diagnosis not present

## 2022-02-22 DIAGNOSIS — I10 Essential (primary) hypertension: Secondary | ICD-10-CM | POA: Insufficient documentation

## 2022-02-22 DIAGNOSIS — W1839XA Other fall on same level, initial encounter: Secondary | ICD-10-CM | POA: Insufficient documentation

## 2022-02-22 DIAGNOSIS — S82001A Unspecified fracture of right patella, initial encounter for closed fracture: Secondary | ICD-10-CM | POA: Diagnosis not present

## 2022-02-22 DIAGNOSIS — R600 Localized edema: Secondary | ICD-10-CM | POA: Diagnosis not present

## 2022-02-22 DIAGNOSIS — S8991XA Unspecified injury of right lower leg, initial encounter: Secondary | ICD-10-CM | POA: Diagnosis present

## 2022-02-22 MED ORDER — OXYCODONE-ACETAMINOPHEN 5-325 MG PO TABS
1.0000 | ORAL_TABLET | Freq: Once | ORAL | Status: AC
  Administered 2022-02-22: 1 via ORAL
  Filled 2022-02-22: qty 1

## 2022-02-22 MED ORDER — OXYCODONE HCL 5 MG PO TABS: 5.0000 mg | ORAL_TABLET | ORAL | 0 refills | Status: DC | PRN

## 2022-02-22 NOTE — Discharge Instructions (Addendum)
You were seen in the emergency department for evaluation after your fall and right knee pain.  It is shown that your patella is fractured.  You will need to remain in the knee immobilizer and use a walker or crutches to get around.  Please make sure you do not take off his knee immobilizer.  You can take 600 mg of ibuprofen and/or 1000 mg of Tylenol every 6 hours as needed for pain.  For breakthrough pain, you can take an oxycodone.  This is a narcotic pain medication.  This can make you not steady on your feet or drowsy, please make sure you are not ambulating while on these medications.  No driving given that you are unable to move your right leg.  Please make sure you follow-up with [facility] office Monday morning at 8:30 in the morning.  If you have any concern, new or worsening symptoms, please return to the nearest emergency department for evaluation.  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  I have included additional information on this.  I will follow-up your primary care doctor for further evaluation of this.  Contact a doctor if: The skin around the cast or splint gets red or raw. The skin under the cast is very itchy or painful. Your cast or splint: Gets damaged. Feels very uncomfortable. Is too tight or too loose. Your cast becomes wet or it starts to have a soft spot or area. There is a bad smell coming from under your cast. You get an object stuck under your cast. Get help right away if: You get any symptoms of compartment syndrome, such as: Very bad pain or pressure under the cast. Numbness, tingling, coldness, or pale or bluish skin. The part of your body above or below the cast is swollen, and it turns a different color (is discolored). You cannot feel or move your fingers or toes. Your pain gets worse. There is fluid leaking through the cast. You have trouble breathing or shortness of breath. You have chest  pain. These symptoms may be an emergency. Get help right away. Call your local emergency services (911 in the [place] Do not wait to see if the symptoms will go away. Do not drive yourself to the hospital.

## 2022-02-22 NOTE — ED Triage Notes (Addendum)
Pt is here for right knee pain after mechanical fall pta.  Pt reports a twisting movement with fall. Pt has been able to ambulate since fall but reports pain and difficulty with ambulation.  Pt denies hitting head

## 2022-02-22 NOTE — ED Notes (Signed)
Went in to explain with the provider what the plan was. No complaints at this time.

## 2022-02-22 NOTE — ED Provider Notes (Signed)
[facility] EMERGENCY DEPT Provider Note   CSN: [id number] Arrival date & time: 02/22/22  1552     History Chief Complaint  Patient presents with   Knee Injury    Alex Kemp is a 81 y.o. male with h/o hyperlipidemia, hypertension, GERD, presents the emergency department for evaluation of right knee pain.  Earlier today, patient reports he was at a [employer] when he tripped over the lining from tile to carpet.  He reports that he landed on his right knee.  He denies hitting his head.  Denies any chest, abdomen, neck, or back pain.  Denies any other extremity pain.  HPI     Home Medications Prior to Admission medications   Medication Sig Start Date End Date Taking? Authorizing Provider  alfuzosin (UROXATRAL) 10 MG 24 hr tablet Take 10 mg by mouth daily. 01/14/20   [provider]  allopurinol (ZYLOPRIM) 100 MG tablet Take 1 tablet (100 mg total) by mouth daily. 01/15/21   Biagio Borg, MD  amLODipine (NORVASC) 5 MG tablet TAKE 1 TABLET DAILY 06/04/21   Biagio Borg, MD  aspirin EC 81 MG tablet Take 81 mg by mouth daily with lunch.    [provider]  b complex vitamins tablet Take 1 tablet by mouth daily with lunch.    [provider]  cetirizine (ZYRTEC) 10 MG tablet Take 10 mg by mouth daily.    [provider]  Cholecalciferol (VITAMIN D) 50 MCG (2000 UT) tablet Take 2,000 Units by mouth daily.    [provider]  clopidogrel (PLAVIX) 75 MG tablet TAKE 1 TABLET BY MOUTH EVERY DAY 12/10/21   Kris Hartmann, NP  Coenzyme Q10 (COQ10 PO) Take 1 tablet by mouth daily with lunch.    [provider]  cyanocobalamin 1000 MCG tablet Take 1,000 mcg by mouth daily.    [provider]  diphenoxylate-atropine (LOMOTIL) 2.5-0.025 MG tablet 1 tab by mouth every 6 hrs as needed Patient taking differently: Take 1 tablet by mouth every 6 (six) hours as needed for diarrhea or loose stools. 1 tab by mouth every 6  hrs as needed 03/29/19   Biagio Borg, MD  ezetimibe (ZETIA) 10 MG tablet Take 1 tablet (10 mg total) by mouth daily. 01/18/22   Biagio Borg, MD  finasteride (PROSCAR) 5 MG tablet Take 5 mg by mouth daily.    [provider]  Glucosamine HCl (GLUCOSAMINE PO) Take 1 tablet by mouth daily with lunch.    [provider]  losartan (COZAAR) 50 MG tablet TAKE 1 TABLET DAILY 01/10/21   Biagio Borg, MD  Multiple Vitamin (MULTIVITAMIN WITH MINERALS) TABS tablet Take 1 tablet by mouth daily with lunch.    [provider]  Omega-3 Fatty Acids (FISH OIL PO) Take 1 capsule by mouth daily with lunch.    [provider]  pantoprazole (PROTONIX) 40 MG tablet Take 1 tablet (40 mg total) by mouth 2 (two) times daily. 01/15/21   Biagio Borg, MD  Polyvinyl Alcohol-Povidone (REFRESH OP) Place 1 drop into both eyes 2 (two) times daily.    [provider]  rOPINIRole (REQUIP) 1 MG tablet TAKE 1 TABLET AT BEDTIME 11/11/21   Biagio Borg, MD  rosuvastatin (CRESTOR) 40 MG tablet Take 1 tablet (40 mg total) by mouth daily. 07/19/21   Biagio Borg, MD  tamsulosin (FLOMAX) 0.4 MG CAPS capsule Take 1 capsule (0.4 mg total) by mouth daily. 01/05/18  Biagio Borg, MD  temazepam (RESTORIL) 30 MG capsule TAKE 1 CAPSULE AT BEDTIME  AS NEEDED 09/02/21   Biagio Borg, MD  tolterodine (DETROL LA) 4 MG 24 hr capsule TAKE 1 CAPSULE DAILY AT 12 NOON Patient taking differently: Take 4 mg by mouth daily. 05/22/20   Biagio Borg, MD  triamcinolone (NASACORT) 55 MCG/ACT AERO nasal inhaler Place 2 sprays into the nose daily. 01/15/21   Biagio Borg, MD      Allergies    Penicillins    Review of Systems   Review of Systems  Physical Exam Updated Vital Signs BP (!) 168/83   Pulse (!) 48   Temp 98.3 F (36.8 C) (Oral)   Resp 20   SpO2 99%  Physical Exam  ED Results / Procedures / Treatments   Labs (all labs ordered are listed, but only abnormal results are displayed) Labs  Reviewed - No data to display  EKG None  Radiology DG Knee Complete 4 Views Right  Result Date: 02/22/2022 CLINICAL DATA:  Fall, knee pain EXAM: RIGHT KNEE - COMPLETE 4+ VIEW COMPARISON:  None Available. FINDINGS: Comminuted, mildly displaced predominantly transverse morphology fractures of the right patella. No other fracture or dislocation of the right knee. Large knee joint effusion. Joint spaces are preserved. Soft tissue edema anteriorly. IMPRESSION: 1. Comminuted, mildly displaced predominantly transverse morphology fractures of the right patella. 2. No other fracture or dislocation of the right knee. 3. Large knee joint effusion. Electronically Signed   By: Delanna Ahmadi M.D.   On: 02/22/2022 16:39    Procedures Procedures   Medications Ordered in ED Medications - No data to display  ED Course/ Medical Decision Making/ A&P                           Medical Decision Making Amount and/or Complexity of Data Reviewed Radiology: ordered.  Risk Prescription drug management.   ***  Consulted orthopedic given x-ray findings.  Spoke with Dr. Percell Miller who recommended knee immobilizer with crutches.  Patient to be toe weightbearing.  Patient needs to follow-up with Dr. Debroah Loop office at 830 on Monday morning.   Final Clinical Impression(s) / ED Diagnoses Final diagnoses:  None    Rx / DC Orders ED Discharge Orders     None

## 2022-02-22 NOTE — Telephone Encounter (Signed)
Patient called in stating that he fell at [employer] today, landed on knees. R knee has pain with extension, both knees have skin that is broken on it but hurts to bare weight. Sent patient to triage.

## 2022-02-22 NOTE — ED Notes (Signed)
Went in to make patient comfortable and explain what they were waiting for.  Gave a urinal and blanket.

## 2022-02-22 NOTE — ED Notes (Signed)
Fatoumata NT assisted this tech with applying knee immobolizer and training with walker. Attempted to use crutches but Pt unstable. PA advised of training. Pt and wife advised staff that he has multiple walkers at home.

## 2022-02-25 DIAGNOSIS — S82001A Unspecified fracture of right patella, initial encounter for closed fracture: Secondary | ICD-10-CM | POA: Diagnosis not present

## 2022-02-25 NOTE — Telephone Encounter (Signed)
Pt was seen at ED.   Nurse Assessment Nurse: Stephanie Coup, RN, Karlene Einstein Date/Time Eilene Ghazi Time): 02/22/2022 2:54:57 PM Confirm and document reason for call. If symptomatic, describe symptoms. ---Caller states he fell and landed on his knee. Having right knee pain with extension. states he cant bear weight on it. states pain present when he straightens it out Does the patient have any new or worsening symptoms? ---Yes Will a triage be completed? ---Yes Related visit to physician within the last 2 weeks? ---No Does the PT have any chronic conditions? (i.e. diabetes, asthma, this includes High risk factors for pregnancy, etc.) ---Yes List chronic conditions. ---htn, on blood thinners Is this a behavioral health or substance abuse call? ---No  Final Disposition 02/22/2022 3:00:25 PM See PCP within 24 Hours Yes Stephanie Coup, RN, Karlene Einstein  Caller Disagree/Comply Comply Caller Understands Yes PreDisposition Call Doctor Care Advice Given Per Guideline SEE PCP WITHIN 24 HOURS: USE HEAT ON AREA AFTER 48 HOURS: * If pain, swelling, or bruising last more than 48 hours (2 days), then use heat on the area. CALL BACK IF: * Pain becomes severe * You become worse CARE ADVICE given per Knee Injury (Adult) guideline. * IF OFFICE WILL BE OPEN: You need to be examined within the next 24 hours. Call your doctor (or NP/PA) when the office opens and make an appointment. Referrals REFERRED TO PCP OFFICE

## 2022-02-27 ENCOUNTER — Encounter: Payer: Self-pay | Admitting: Internal Medicine

## 2022-02-27 DIAGNOSIS — S82001D Unspecified fracture of right patella, subsequent encounter for closed fracture with routine healing: Secondary | ICD-10-CM

## 2022-03-02 DIAGNOSIS — G8929 Other chronic pain: Secondary | ICD-10-CM | POA: Diagnosis not present

## 2022-03-02 DIAGNOSIS — R7303 Prediabetes: Secondary | ICD-10-CM | POA: Diagnosis not present

## 2022-03-02 DIAGNOSIS — W19XXXD Unspecified fall, subsequent encounter: Secondary | ICD-10-CM | POA: Diagnosis not present

## 2022-03-02 DIAGNOSIS — E782 Mixed hyperlipidemia: Secondary | ICD-10-CM | POA: Diagnosis not present

## 2022-03-02 DIAGNOSIS — Z79891 Long term (current) use of opiate analgesic: Secondary | ICD-10-CM | POA: Diagnosis not present

## 2022-03-02 DIAGNOSIS — I493 Ventricular premature depolarization: Secondary | ICD-10-CM | POA: Diagnosis not present

## 2022-03-02 DIAGNOSIS — M533 Sacrococcygeal disorders, not elsewhere classified: Secondary | ICD-10-CM | POA: Diagnosis not present

## 2022-03-02 DIAGNOSIS — R3911 Hesitancy of micturition: Secondary | ICD-10-CM | POA: Diagnosis not present

## 2022-03-02 DIAGNOSIS — G4700 Insomnia, unspecified: Secondary | ICD-10-CM | POA: Diagnosis not present

## 2022-03-02 DIAGNOSIS — Z7902 Long term (current) use of antithrombotics/antiplatelets: Secondary | ICD-10-CM | POA: Diagnosis not present

## 2022-03-02 DIAGNOSIS — I129 Hypertensive chronic kidney disease with stage 1 through stage 4 chronic kidney disease, or unspecified chronic kidney disease: Secondary | ICD-10-CM | POA: Diagnosis not present

## 2022-03-02 DIAGNOSIS — G2581 Restless legs syndrome: Secondary | ICD-10-CM | POA: Diagnosis not present

## 2022-03-02 DIAGNOSIS — K219 Gastro-esophageal reflux disease without esophagitis: Secondary | ICD-10-CM | POA: Diagnosis not present

## 2022-03-02 DIAGNOSIS — E559 Vitamin D deficiency, unspecified: Secondary | ICD-10-CM | POA: Diagnosis not present

## 2022-03-02 DIAGNOSIS — M150 Primary generalized (osteo)arthritis: Secondary | ICD-10-CM | POA: Diagnosis not present

## 2022-03-02 DIAGNOSIS — N183 Chronic kidney disease, stage 3 unspecified: Secondary | ICD-10-CM | POA: Diagnosis not present

## 2022-03-02 DIAGNOSIS — R3915 Urgency of urination: Secondary | ICD-10-CM | POA: Diagnosis not present

## 2022-03-02 DIAGNOSIS — S82041D Displaced comminuted fracture of right patella, subsequent encounter for closed fracture with routine healing: Secondary | ICD-10-CM | POA: Diagnosis not present

## 2022-03-02 DIAGNOSIS — Z7982 Long term (current) use of aspirin: Secondary | ICD-10-CM | POA: Diagnosis not present

## 2022-03-02 DIAGNOSIS — Z87891 Personal history of nicotine dependence: Secondary | ICD-10-CM | POA: Diagnosis not present

## 2022-03-02 DIAGNOSIS — R351 Nocturia: Secondary | ICD-10-CM | POA: Diagnosis not present

## 2022-03-02 DIAGNOSIS — Z9181 History of falling: Secondary | ICD-10-CM | POA: Diagnosis not present

## 2022-03-04 ENCOUNTER — Telehealth: Payer: Self-pay

## 2022-03-04 DIAGNOSIS — S82001D Unspecified fracture of right patella, subsequent encounter for closed fracture with routine healing: Secondary | ICD-10-CM | POA: Diagnosis not present

## 2022-03-04 NOTE — Telephone Encounter (Signed)
Transition Care Management Follow-up Telephone Call Date of discharge and from where: 02/22/2022 [facility] How have you been since you were released from the hospital?  Any questions or concerns? No  Items Reviewed: Did the pt receive and understand the discharge instructions provided? No  Medications obtained and verified? No  Other? No  Any new allergies since your discharge? No  Dietary orders reviewed? No Do you have support at home? Yes   Home Care and Equipment/Supplies: Were home health services ordered? not applicable If so, what is the name of the agency?   Has the agency set up a time to come to the patient's home? not applicable Were any new equipment or medical supplies ordered?  No What is the name of the medical supply agency?  Were you able to get the supplies/equipment?  Do you have any questions related to the use of the equipment or supplies? No  Functional Questionnaire: (I = Independent and D = Dependent) ADLs:   Bathing/Dressing-  Meal Prep-   Eating-   Maintaining continence-   Transferring/Ambulation-   Managing Meds-   Follow up appointments reviewed:  PCP Hospital f/u appt confirmed? Yes  Scheduled to see Dr.Murphy on at [facility] on 10/2 and 10/9.  [facility] f/u appt confirmed?  Are transportation arrangements needed?    If their condition worsens, is the pt aware to call PCP or go to the Emergency Dept.? No Was the patient provided with contact information for the PCP's office or ED? No Was to pt encouraged to call back with questions or concerns? No

## 2022-03-05 DIAGNOSIS — S82041D Displaced comminuted fracture of right patella, subsequent encounter for closed fracture with routine healing: Secondary | ICD-10-CM | POA: Diagnosis not present

## 2022-03-05 DIAGNOSIS — M533 Sacrococcygeal disorders, not elsewhere classified: Secondary | ICD-10-CM | POA: Diagnosis not present

## 2022-03-05 DIAGNOSIS — I129 Hypertensive chronic kidney disease with stage 1 through stage 4 chronic kidney disease, or unspecified chronic kidney disease: Secondary | ICD-10-CM | POA: Diagnosis not present

## 2022-03-05 DIAGNOSIS — G8929 Other chronic pain: Secondary | ICD-10-CM | POA: Diagnosis not present

## 2022-03-05 DIAGNOSIS — W19XXXD Unspecified fall, subsequent encounter: Secondary | ICD-10-CM | POA: Diagnosis not present

## 2022-03-05 DIAGNOSIS — M150 Primary generalized (osteo)arthritis: Secondary | ICD-10-CM | POA: Diagnosis not present

## 2022-03-06 DIAGNOSIS — M533 Sacrococcygeal disorders, not elsewhere classified: Secondary | ICD-10-CM | POA: Diagnosis not present

## 2022-03-06 DIAGNOSIS — I129 Hypertensive chronic kidney disease with stage 1 through stage 4 chronic kidney disease, or unspecified chronic kidney disease: Secondary | ICD-10-CM | POA: Diagnosis not present

## 2022-03-06 DIAGNOSIS — M150 Primary generalized (osteo)arthritis: Secondary | ICD-10-CM | POA: Diagnosis not present

## 2022-03-06 DIAGNOSIS — S82041D Displaced comminuted fracture of right patella, subsequent encounter for closed fracture with routine healing: Secondary | ICD-10-CM | POA: Diagnosis not present

## 2022-03-06 DIAGNOSIS — G8929 Other chronic pain: Secondary | ICD-10-CM | POA: Diagnosis not present

## 2022-03-06 DIAGNOSIS — W19XXXD Unspecified fall, subsequent encounter: Secondary | ICD-10-CM | POA: Diagnosis not present

## 2022-03-07 DIAGNOSIS — W19XXXD Unspecified fall, subsequent encounter: Secondary | ICD-10-CM | POA: Diagnosis not present

## 2022-03-07 DIAGNOSIS — M533 Sacrococcygeal disorders, not elsewhere classified: Secondary | ICD-10-CM | POA: Diagnosis not present

## 2022-03-07 DIAGNOSIS — S82041D Displaced comminuted fracture of right patella, subsequent encounter for closed fracture with routine healing: Secondary | ICD-10-CM | POA: Diagnosis not present

## 2022-03-07 DIAGNOSIS — G8929 Other chronic pain: Secondary | ICD-10-CM | POA: Diagnosis not present

## 2022-03-07 DIAGNOSIS — M150 Primary generalized (osteo)arthritis: Secondary | ICD-10-CM | POA: Diagnosis not present

## 2022-03-07 DIAGNOSIS — I129 Hypertensive chronic kidney disease with stage 1 through stage 4 chronic kidney disease, or unspecified chronic kidney disease: Secondary | ICD-10-CM | POA: Diagnosis not present

## 2022-03-08 DIAGNOSIS — M150 Primary generalized (osteo)arthritis: Secondary | ICD-10-CM | POA: Diagnosis not present

## 2022-03-08 DIAGNOSIS — W19XXXD Unspecified fall, subsequent encounter: Secondary | ICD-10-CM | POA: Diagnosis not present

## 2022-03-08 DIAGNOSIS — I129 Hypertensive chronic kidney disease with stage 1 through stage 4 chronic kidney disease, or unspecified chronic kidney disease: Secondary | ICD-10-CM | POA: Diagnosis not present

## 2022-03-08 DIAGNOSIS — M533 Sacrococcygeal disorders, not elsewhere classified: Secondary | ICD-10-CM | POA: Diagnosis not present

## 2022-03-08 DIAGNOSIS — G8929 Other chronic pain: Secondary | ICD-10-CM | POA: Diagnosis not present

## 2022-03-08 DIAGNOSIS — S82041D Displaced comminuted fracture of right patella, subsequent encounter for closed fracture with routine healing: Secondary | ICD-10-CM | POA: Diagnosis not present

## 2022-03-12 DIAGNOSIS — M533 Sacrococcygeal disorders, not elsewhere classified: Secondary | ICD-10-CM | POA: Diagnosis not present

## 2022-03-12 DIAGNOSIS — G8929 Other chronic pain: Secondary | ICD-10-CM | POA: Diagnosis not present

## 2022-03-12 DIAGNOSIS — W19XXXD Unspecified fall, subsequent encounter: Secondary | ICD-10-CM | POA: Diagnosis not present

## 2022-03-12 DIAGNOSIS — I129 Hypertensive chronic kidney disease with stage 1 through stage 4 chronic kidney disease, or unspecified chronic kidney disease: Secondary | ICD-10-CM | POA: Diagnosis not present

## 2022-03-12 DIAGNOSIS — S82041D Displaced comminuted fracture of right patella, subsequent encounter for closed fracture with routine healing: Secondary | ICD-10-CM | POA: Diagnosis not present

## 2022-03-12 DIAGNOSIS — M150 Primary generalized (osteo)arthritis: Secondary | ICD-10-CM | POA: Diagnosis not present

## 2022-03-14 DIAGNOSIS — W19XXXD Unspecified fall, subsequent encounter: Secondary | ICD-10-CM | POA: Diagnosis not present

## 2022-03-14 DIAGNOSIS — M533 Sacrococcygeal disorders, not elsewhere classified: Secondary | ICD-10-CM | POA: Diagnosis not present

## 2022-03-14 DIAGNOSIS — M150 Primary generalized (osteo)arthritis: Secondary | ICD-10-CM | POA: Diagnosis not present

## 2022-03-14 DIAGNOSIS — I129 Hypertensive chronic kidney disease with stage 1 through stage 4 chronic kidney disease, or unspecified chronic kidney disease: Secondary | ICD-10-CM | POA: Diagnosis not present

## 2022-03-14 DIAGNOSIS — G8929 Other chronic pain: Secondary | ICD-10-CM | POA: Diagnosis not present

## 2022-03-14 DIAGNOSIS — S82041D Displaced comminuted fracture of right patella, subsequent encounter for closed fracture with routine healing: Secondary | ICD-10-CM | POA: Diagnosis not present

## 2022-03-15 DIAGNOSIS — M533 Sacrococcygeal disorders, not elsewhere classified: Secondary | ICD-10-CM | POA: Diagnosis not present

## 2022-03-15 DIAGNOSIS — S82041D Displaced comminuted fracture of right patella, subsequent encounter for closed fracture with routine healing: Secondary | ICD-10-CM | POA: Diagnosis not present

## 2022-03-15 DIAGNOSIS — I129 Hypertensive chronic kidney disease with stage 1 through stage 4 chronic kidney disease, or unspecified chronic kidney disease: Secondary | ICD-10-CM | POA: Diagnosis not present

## 2022-03-15 DIAGNOSIS — W19XXXD Unspecified fall, subsequent encounter: Secondary | ICD-10-CM | POA: Diagnosis not present

## 2022-03-15 DIAGNOSIS — M150 Primary generalized (osteo)arthritis: Secondary | ICD-10-CM | POA: Diagnosis not present

## 2022-03-15 DIAGNOSIS — G8929 Other chronic pain: Secondary | ICD-10-CM | POA: Diagnosis not present

## 2022-03-18 DIAGNOSIS — M150 Primary generalized (osteo)arthritis: Secondary | ICD-10-CM | POA: Diagnosis not present

## 2022-03-18 DIAGNOSIS — M533 Sacrococcygeal disorders, not elsewhere classified: Secondary | ICD-10-CM | POA: Diagnosis not present

## 2022-03-18 DIAGNOSIS — S82001D Unspecified fracture of right patella, subsequent encounter for closed fracture with routine healing: Secondary | ICD-10-CM | POA: Diagnosis not present

## 2022-03-18 DIAGNOSIS — W19XXXD Unspecified fall, subsequent encounter: Secondary | ICD-10-CM | POA: Diagnosis not present

## 2022-03-18 DIAGNOSIS — S82041D Displaced comminuted fracture of right patella, subsequent encounter for closed fracture with routine healing: Secondary | ICD-10-CM | POA: Diagnosis not present

## 2022-03-18 DIAGNOSIS — G8929 Other chronic pain: Secondary | ICD-10-CM | POA: Diagnosis not present

## 2022-03-18 DIAGNOSIS — I129 Hypertensive chronic kidney disease with stage 1 through stage 4 chronic kidney disease, or unspecified chronic kidney disease: Secondary | ICD-10-CM | POA: Diagnosis not present

## 2022-03-19 DIAGNOSIS — M150 Primary generalized (osteo)arthritis: Secondary | ICD-10-CM | POA: Diagnosis not present

## 2022-03-19 DIAGNOSIS — S82041D Displaced comminuted fracture of right patella, subsequent encounter for closed fracture with routine healing: Secondary | ICD-10-CM | POA: Diagnosis not present

## 2022-03-19 DIAGNOSIS — W19XXXD Unspecified fall, subsequent encounter: Secondary | ICD-10-CM | POA: Diagnosis not present

## 2022-03-19 DIAGNOSIS — G8929 Other chronic pain: Secondary | ICD-10-CM | POA: Diagnosis not present

## 2022-03-19 DIAGNOSIS — M533 Sacrococcygeal disorders, not elsewhere classified: Secondary | ICD-10-CM | POA: Diagnosis not present

## 2022-03-19 DIAGNOSIS — I129 Hypertensive chronic kidney disease with stage 1 through stage 4 chronic kidney disease, or unspecified chronic kidney disease: Secondary | ICD-10-CM | POA: Diagnosis not present

## 2022-03-21 DIAGNOSIS — I129 Hypertensive chronic kidney disease with stage 1 through stage 4 chronic kidney disease, or unspecified chronic kidney disease: Secondary | ICD-10-CM | POA: Diagnosis not present

## 2022-03-21 DIAGNOSIS — M533 Sacrococcygeal disorders, not elsewhere classified: Secondary | ICD-10-CM | POA: Diagnosis not present

## 2022-03-21 DIAGNOSIS — G8929 Other chronic pain: Secondary | ICD-10-CM | POA: Diagnosis not present

## 2022-03-21 DIAGNOSIS — M150 Primary generalized (osteo)arthritis: Secondary | ICD-10-CM | POA: Diagnosis not present

## 2022-03-21 DIAGNOSIS — S82041D Displaced comminuted fracture of right patella, subsequent encounter for closed fracture with routine healing: Secondary | ICD-10-CM | POA: Diagnosis not present

## 2022-03-21 DIAGNOSIS — W19XXXD Unspecified fall, subsequent encounter: Secondary | ICD-10-CM | POA: Diagnosis not present

## 2022-03-23 DIAGNOSIS — I129 Hypertensive chronic kidney disease with stage 1 through stage 4 chronic kidney disease, or unspecified chronic kidney disease: Secondary | ICD-10-CM | POA: Diagnosis not present

## 2022-03-23 DIAGNOSIS — M533 Sacrococcygeal disorders, not elsewhere classified: Secondary | ICD-10-CM | POA: Diagnosis not present

## 2022-03-23 DIAGNOSIS — G8929 Other chronic pain: Secondary | ICD-10-CM | POA: Diagnosis not present

## 2022-03-23 DIAGNOSIS — W19XXXD Unspecified fall, subsequent encounter: Secondary | ICD-10-CM | POA: Diagnosis not present

## 2022-03-23 DIAGNOSIS — S82041D Displaced comminuted fracture of right patella, subsequent encounter for closed fracture with routine healing: Secondary | ICD-10-CM | POA: Diagnosis not present

## 2022-03-23 DIAGNOSIS — M150 Primary generalized (osteo)arthritis: Secondary | ICD-10-CM | POA: Diagnosis not present

## 2022-03-25 ENCOUNTER — Encounter (INDEPENDENT_AMBULATORY_CARE_PROVIDER_SITE_OTHER): Payer: Self-pay

## 2022-03-25 DIAGNOSIS — S82001D Unspecified fracture of right patella, subsequent encounter for closed fracture with routine healing: Secondary | ICD-10-CM | POA: Diagnosis not present

## 2022-03-26 DIAGNOSIS — W19XXXD Unspecified fall, subsequent encounter: Secondary | ICD-10-CM | POA: Diagnosis not present

## 2022-03-26 DIAGNOSIS — M533 Sacrococcygeal disorders, not elsewhere classified: Secondary | ICD-10-CM | POA: Diagnosis not present

## 2022-03-26 DIAGNOSIS — M150 Primary generalized (osteo)arthritis: Secondary | ICD-10-CM | POA: Diagnosis not present

## 2022-03-26 DIAGNOSIS — S82041D Displaced comminuted fracture of right patella, subsequent encounter for closed fracture with routine healing: Secondary | ICD-10-CM | POA: Diagnosis not present

## 2022-03-26 DIAGNOSIS — I129 Hypertensive chronic kidney disease with stage 1 through stage 4 chronic kidney disease, or unspecified chronic kidney disease: Secondary | ICD-10-CM | POA: Diagnosis not present

## 2022-03-26 DIAGNOSIS — G8929 Other chronic pain: Secondary | ICD-10-CM | POA: Diagnosis not present

## 2022-03-28 DIAGNOSIS — S82041D Displaced comminuted fracture of right patella, subsequent encounter for closed fracture with routine healing: Secondary | ICD-10-CM | POA: Diagnosis not present

## 2022-03-28 DIAGNOSIS — M150 Primary generalized (osteo)arthritis: Secondary | ICD-10-CM | POA: Diagnosis not present

## 2022-03-28 DIAGNOSIS — G8929 Other chronic pain: Secondary | ICD-10-CM | POA: Diagnosis not present

## 2022-03-28 DIAGNOSIS — M533 Sacrococcygeal disorders, not elsewhere classified: Secondary | ICD-10-CM | POA: Diagnosis not present

## 2022-03-28 DIAGNOSIS — W19XXXD Unspecified fall, subsequent encounter: Secondary | ICD-10-CM | POA: Diagnosis not present

## 2022-03-28 DIAGNOSIS — I129 Hypertensive chronic kidney disease with stage 1 through stage 4 chronic kidney disease, or unspecified chronic kidney disease: Secondary | ICD-10-CM | POA: Diagnosis not present

## 2022-03-30 ENCOUNTER — Encounter: Payer: Self-pay | Admitting: Internal Medicine

## 2022-04-01 ENCOUNTER — Telehealth: Payer: Self-pay | Admitting: Internal Medicine

## 2022-04-01 ENCOUNTER — Other Ambulatory Visit: Payer: Self-pay | Admitting: Internal Medicine

## 2022-04-01 DIAGNOSIS — G2581 Restless legs syndrome: Secondary | ICD-10-CM | POA: Diagnosis not present

## 2022-04-01 DIAGNOSIS — Z87891 Personal history of nicotine dependence: Secondary | ICD-10-CM | POA: Diagnosis not present

## 2022-04-01 DIAGNOSIS — R3911 Hesitancy of micturition: Secondary | ICD-10-CM | POA: Diagnosis not present

## 2022-04-01 DIAGNOSIS — R7303 Prediabetes: Secondary | ICD-10-CM | POA: Diagnosis not present

## 2022-04-01 DIAGNOSIS — I129 Hypertensive chronic kidney disease with stage 1 through stage 4 chronic kidney disease, or unspecified chronic kidney disease: Secondary | ICD-10-CM | POA: Diagnosis not present

## 2022-04-01 DIAGNOSIS — Z79891 Long term (current) use of opiate analgesic: Secondary | ICD-10-CM | POA: Diagnosis not present

## 2022-04-01 DIAGNOSIS — G4700 Insomnia, unspecified: Secondary | ICD-10-CM | POA: Diagnosis not present

## 2022-04-01 DIAGNOSIS — R351 Nocturia: Secondary | ICD-10-CM | POA: Diagnosis not present

## 2022-04-01 DIAGNOSIS — R3915 Urgency of urination: Secondary | ICD-10-CM | POA: Diagnosis not present

## 2022-04-01 DIAGNOSIS — I493 Ventricular premature depolarization: Secondary | ICD-10-CM | POA: Diagnosis not present

## 2022-04-01 DIAGNOSIS — N183 Chronic kidney disease, stage 3 unspecified: Secondary | ICD-10-CM | POA: Diagnosis not present

## 2022-04-01 DIAGNOSIS — Z7982 Long term (current) use of aspirin: Secondary | ICD-10-CM | POA: Diagnosis not present

## 2022-04-01 DIAGNOSIS — G8929 Other chronic pain: Secondary | ICD-10-CM | POA: Diagnosis not present

## 2022-04-01 DIAGNOSIS — E559 Vitamin D deficiency, unspecified: Secondary | ICD-10-CM | POA: Diagnosis not present

## 2022-04-01 DIAGNOSIS — E782 Mixed hyperlipidemia: Secondary | ICD-10-CM | POA: Diagnosis not present

## 2022-04-01 DIAGNOSIS — Z9181 History of falling: Secondary | ICD-10-CM | POA: Diagnosis not present

## 2022-04-01 DIAGNOSIS — K219 Gastro-esophageal reflux disease without esophagitis: Secondary | ICD-10-CM | POA: Diagnosis not present

## 2022-04-01 DIAGNOSIS — S82041D Displaced comminuted fracture of right patella, subsequent encounter for closed fracture with routine healing: Secondary | ICD-10-CM | POA: Diagnosis not present

## 2022-04-01 DIAGNOSIS — M150 Primary generalized (osteo)arthritis: Secondary | ICD-10-CM | POA: Diagnosis not present

## 2022-04-01 DIAGNOSIS — W19XXXD Unspecified fall, subsequent encounter: Secondary | ICD-10-CM | POA: Diagnosis not present

## 2022-04-01 DIAGNOSIS — M533 Sacrococcygeal disorders, not elsewhere classified: Secondary | ICD-10-CM | POA: Diagnosis not present

## 2022-04-01 DIAGNOSIS — Z7902 Long term (current) use of antithrombotics/antiplatelets: Secondary | ICD-10-CM | POA: Diagnosis not present

## 2022-04-01 NOTE — Telephone Encounter (Signed)
Alex Kemp called from [facility] to give Dr Jenny Reichmann some info. 03/28/22 patient had a fall but patient said he was fine, He was trying to get up from dining room table and fell.  Call back for Alex if needed is [phone]

## 2022-04-02 ENCOUNTER — Other Ambulatory Visit: Payer: Self-pay | Admitting: Internal Medicine

## 2022-04-02 MED ORDER — MECLIZINE HCL 12.5 MG PO TABS: 12.5000 mg | ORAL_TABLET | Freq: Three times a day (TID) | ORAL | 1 refills | Status: DC | PRN

## 2022-04-02 NOTE — Telephone Encounter (Signed)
Ok this is noted, nothing else needed

## 2022-04-02 NOTE — Telephone Encounter (Signed)
Home health nurse states that patient had a fall and he is ok. Let me know if you have any questions, I can give her a call.

## 2022-04-04 DIAGNOSIS — M150 Primary generalized (osteo)arthritis: Secondary | ICD-10-CM | POA: Diagnosis not present

## 2022-04-04 DIAGNOSIS — M533 Sacrococcygeal disorders, not elsewhere classified: Secondary | ICD-10-CM | POA: Diagnosis not present

## 2022-04-04 DIAGNOSIS — I129 Hypertensive chronic kidney disease with stage 1 through stage 4 chronic kidney disease, or unspecified chronic kidney disease: Secondary | ICD-10-CM | POA: Diagnosis not present

## 2022-04-04 DIAGNOSIS — W19XXXD Unspecified fall, subsequent encounter: Secondary | ICD-10-CM | POA: Diagnosis not present

## 2022-04-04 DIAGNOSIS — S82041D Displaced comminuted fracture of right patella, subsequent encounter for closed fracture with routine healing: Secondary | ICD-10-CM | POA: Diagnosis not present

## 2022-04-04 DIAGNOSIS — G8929 Other chronic pain: Secondary | ICD-10-CM | POA: Diagnosis not present

## 2022-04-04 NOTE — Telephone Encounter (Signed)
Ok this is noted 

## 2022-04-08 ENCOUNTER — Encounter: Payer: Self-pay | Admitting: Internal Medicine

## 2022-04-08 DIAGNOSIS — S82001D Unspecified fracture of right patella, subsequent encounter for closed fracture with routine healing: Secondary | ICD-10-CM | POA: Diagnosis not present

## 2022-04-11 NOTE — Telephone Encounter (Signed)
Tried to process PA ( Key: [id number]) received message stating " A PA is already in process for this member/drug." Will inform pt .Marland KitchenJohny Chess

## 2022-04-16 DIAGNOSIS — S82041D Displaced comminuted fracture of right patella, subsequent encounter for closed fracture with routine healing: Secondary | ICD-10-CM | POA: Diagnosis not present

## 2022-04-16 DIAGNOSIS — R262 Difficulty in walking, not elsewhere classified: Secondary | ICD-10-CM | POA: Diagnosis not present

## 2022-04-16 DIAGNOSIS — N1832 Chronic kidney disease, stage 3b: Secondary | ICD-10-CM | POA: Diagnosis not present

## 2022-04-16 DIAGNOSIS — M6281 Muscle weakness (generalized): Secondary | ICD-10-CM | POA: Diagnosis not present

## 2022-04-16 DIAGNOSIS — N400 Enlarged prostate without lower urinary tract symptoms: Secondary | ICD-10-CM | POA: Diagnosis not present

## 2022-04-16 DIAGNOSIS — I129 Hypertensive chronic kidney disease with stage 1 through stage 4 chronic kidney disease, or unspecified chronic kidney disease: Secondary | ICD-10-CM | POA: Diagnosis not present

## 2022-04-16 DIAGNOSIS — D631 Anemia in chronic kidney disease: Secondary | ICD-10-CM | POA: Diagnosis not present

## 2022-04-16 DIAGNOSIS — M25661 Stiffness of right knee, not elsewhere classified: Secondary | ICD-10-CM | POA: Diagnosis not present

## 2022-04-17 ENCOUNTER — Encounter: Payer: Self-pay | Admitting: Internal Medicine

## 2022-04-23 DIAGNOSIS — S82041D Displaced comminuted fracture of right patella, subsequent encounter for closed fracture with routine healing: Secondary | ICD-10-CM | POA: Diagnosis not present

## 2022-04-23 DIAGNOSIS — M6281 Muscle weakness (generalized): Secondary | ICD-10-CM | POA: Diagnosis not present

## 2022-04-23 DIAGNOSIS — M25661 Stiffness of right knee, not elsewhere classified: Secondary | ICD-10-CM | POA: Diagnosis not present

## 2022-04-23 DIAGNOSIS — R262 Difficulty in walking, not elsewhere classified: Secondary | ICD-10-CM | POA: Diagnosis not present

## 2022-04-25 DIAGNOSIS — S82041D Displaced comminuted fracture of right patella, subsequent encounter for closed fracture with routine healing: Secondary | ICD-10-CM | POA: Diagnosis not present

## 2022-04-25 DIAGNOSIS — R262 Difficulty in walking, not elsewhere classified: Secondary | ICD-10-CM | POA: Diagnosis not present

## 2022-04-25 DIAGNOSIS — M25661 Stiffness of right knee, not elsewhere classified: Secondary | ICD-10-CM | POA: Diagnosis not present

## 2022-04-25 DIAGNOSIS — M6281 Muscle weakness (generalized): Secondary | ICD-10-CM | POA: Diagnosis not present

## 2022-04-26 DIAGNOSIS — Z86711 Personal history of pulmonary embolism: Secondary | ICD-10-CM

## 2022-04-26 HISTORY — DX: Personal history of pulmonary embolism: Z86.711

## 2022-04-29 DIAGNOSIS — R262 Difficulty in walking, not elsewhere classified: Secondary | ICD-10-CM | POA: Diagnosis not present

## 2022-04-29 DIAGNOSIS — M25661 Stiffness of right knee, not elsewhere classified: Secondary | ICD-10-CM | POA: Diagnosis not present

## 2022-04-29 DIAGNOSIS — S82041D Displaced comminuted fracture of right patella, subsequent encounter for closed fracture with routine healing: Secondary | ICD-10-CM | POA: Diagnosis not present

## 2022-04-29 DIAGNOSIS — M6281 Muscle weakness (generalized): Secondary | ICD-10-CM | POA: Diagnosis not present

## 2022-05-07 DIAGNOSIS — S82041D Displaced comminuted fracture of right patella, subsequent encounter for closed fracture with routine healing: Secondary | ICD-10-CM | POA: Diagnosis not present

## 2022-05-07 DIAGNOSIS — R262 Difficulty in walking, not elsewhere classified: Secondary | ICD-10-CM | POA: Diagnosis not present

## 2022-05-07 DIAGNOSIS — M25661 Stiffness of right knee, not elsewhere classified: Secondary | ICD-10-CM | POA: Diagnosis not present

## 2022-05-07 DIAGNOSIS — M6281 Muscle weakness (generalized): Secondary | ICD-10-CM | POA: Diagnosis not present

## 2022-05-09 DIAGNOSIS — M6281 Muscle weakness (generalized): Secondary | ICD-10-CM | POA: Diagnosis not present

## 2022-05-09 DIAGNOSIS — R262 Difficulty in walking, not elsewhere classified: Secondary | ICD-10-CM | POA: Diagnosis not present

## 2022-05-09 DIAGNOSIS — M25661 Stiffness of right knee, not elsewhere classified: Secondary | ICD-10-CM | POA: Diagnosis not present

## 2022-05-09 DIAGNOSIS — S82041D Displaced comminuted fracture of right patella, subsequent encounter for closed fracture with routine healing: Secondary | ICD-10-CM | POA: Diagnosis not present

## 2022-05-13 ENCOUNTER — Emergency Department (HOSPITAL_BASED_OUTPATIENT_CLINIC_OR_DEPARTMENT_OTHER): Payer: Medicare Other | Admitting: Radiology

## 2022-05-13 ENCOUNTER — Encounter (HOSPITAL_COMMUNITY): Payer: Self-pay

## 2022-05-13 ENCOUNTER — Emergency Department (HOSPITAL_BASED_OUTPATIENT_CLINIC_OR_DEPARTMENT_OTHER): Payer: Medicare Other

## 2022-05-13 ENCOUNTER — Ambulatory Visit (HOSPITAL_BASED_OUTPATIENT_CLINIC_OR_DEPARTMENT_OTHER)
Admission: RE | Admit: 2022-05-13 | Discharge: 2022-05-13 | Disposition: A | Discharge status: Home / Self Care | Payer: Medicare Other | Source: Ambulatory Visit | Attending: Orthopedic Surgery | Admitting: Orthopedic Surgery

## 2022-05-13 ENCOUNTER — Other Ambulatory Visit: Payer: Self-pay

## 2022-05-13 ENCOUNTER — Other Ambulatory Visit (HOSPITAL_COMMUNITY): Payer: Self-pay | Admitting: Orthopedic Surgery

## 2022-05-13 ENCOUNTER — Emergency Department (HOSPITAL_BASED_OUTPATIENT_CLINIC_OR_DEPARTMENT_OTHER)
Admission: EM | Admit: 2022-05-13 | Discharge: 2022-05-14 | Disposition: A | Discharge status: Home / Self Care | Payer: Medicare Other | Attending: Emergency Medicine | Admitting: Emergency Medicine

## 2022-05-13 DIAGNOSIS — I82401 Acute embolism and thrombosis of unspecified deep veins of right lower extremity: Secondary | ICD-10-CM | POA: Insufficient documentation

## 2022-05-13 DIAGNOSIS — I82521 Chronic embolism and thrombosis of right iliac vein: Secondary | ICD-10-CM | POA: Diagnosis not present

## 2022-05-13 DIAGNOSIS — I129 Hypertensive chronic kidney disease with stage 1 through stage 4 chronic kidney disease, or unspecified chronic kidney disease: Secondary | ICD-10-CM | POA: Insufficient documentation

## 2022-05-13 DIAGNOSIS — M79661 Pain in right lower leg: Secondary | ICD-10-CM

## 2022-05-13 DIAGNOSIS — N183 Chronic kidney disease, stage 3 unspecified: Secondary | ICD-10-CM | POA: Insufficient documentation

## 2022-05-13 DIAGNOSIS — R109 Unspecified abdominal pain: Secondary | ICD-10-CM | POA: Insufficient documentation

## 2022-05-13 DIAGNOSIS — Z79899 Other long term (current) drug therapy: Secondary | ICD-10-CM | POA: Insufficient documentation

## 2022-05-13 DIAGNOSIS — I2699 Other pulmonary embolism without acute cor pulmonale: Secondary | ICD-10-CM | POA: Diagnosis not present

## 2022-05-13 DIAGNOSIS — R0602 Shortness of breath: Secondary | ICD-10-CM | POA: Diagnosis not present

## 2022-05-13 DIAGNOSIS — I82421 Acute embolism and thrombosis of right iliac vein: Secondary | ICD-10-CM

## 2022-05-13 DIAGNOSIS — Z7901 Long term (current) use of anticoagulants: Secondary | ICD-10-CM | POA: Diagnosis not present

## 2022-05-13 DIAGNOSIS — Z85828 Personal history of other malignant neoplasm of skin: Secondary | ICD-10-CM | POA: Insufficient documentation

## 2022-05-13 DIAGNOSIS — Z7902 Long term (current) use of antithrombotics/antiplatelets: Secondary | ICD-10-CM | POA: Diagnosis not present

## 2022-05-13 DIAGNOSIS — I82409 Acute embolism and thrombosis of unspecified deep veins of unspecified lower extremity: Secondary | ICD-10-CM | POA: Diagnosis present

## 2022-05-13 DIAGNOSIS — M25561 Pain in right knee: Secondary | ICD-10-CM | POA: Diagnosis not present

## 2022-05-13 DIAGNOSIS — I2510 Atherosclerotic heart disease of native coronary artery without angina pectoris: Secondary | ICD-10-CM

## 2022-05-13 DIAGNOSIS — Z86711 Personal history of pulmonary embolism: Secondary | ICD-10-CM | POA: Diagnosis present

## 2022-05-13 DIAGNOSIS — N281 Cyst of kidney, acquired: Secondary | ICD-10-CM | POA: Diagnosis not present

## 2022-05-13 DIAGNOSIS — Z7982 Long term (current) use of aspirin: Secondary | ICD-10-CM | POA: Insufficient documentation

## 2022-05-13 DIAGNOSIS — I82491 Acute embolism and thrombosis of other specified deep vein of right lower extremity: Secondary | ICD-10-CM | POA: Diagnosis not present

## 2022-05-13 DIAGNOSIS — N200 Calculus of kidney: Secondary | ICD-10-CM | POA: Diagnosis not present

## 2022-05-13 DIAGNOSIS — J90 Pleural effusion, not elsewhere classified: Secondary | ICD-10-CM | POA: Diagnosis not present

## 2022-05-13 HISTORY — DX: Atherosclerotic heart disease of native coronary artery without angina pectoris: I25.10

## 2022-05-13 LAB — BASIC METABOLIC PANEL
Anion gap: 14 (ref 5–15)
BUN: 17 mg/dL (ref 8–23)
CO2: 18 mmol/L — ABNORMAL LOW (ref 22–32)
Calcium: 9.1 mg/dL (ref 8.9–10.3)
Chloride: 105 mmol/L (ref 98–111)
Creatinine, Ser: 1.55 mg/dL — ABNORMAL HIGH (ref 0.61–1.24)
GFR, Estimated: 45 mL/min — ABNORMAL LOW (ref >60)
Glucose, Bld: 107 mg/dL — ABNORMAL HIGH (ref 70–99)
Potassium: 4 mmol/L (ref 3.5–5.1)
Sodium: 137 mmol/L (ref 135–145)

## 2022-05-13 LAB — CBC
HCT: 35.7 % — ABNORMAL LOW (ref 39.0–52.0)
Hemoglobin: 12.3 g/dL — ABNORMAL LOW (ref 13.0–17.0)
MCH: 30.1 pg (ref 26.0–34.0)
MCHC: 34.5 g/dL (ref 30.0–36.0)
MCV: 87.3 fL (ref 80.0–100.0)
Platelets: 200 10*3/uL (ref 150–400)
RBC: 4.09 MIL/uL — ABNORMAL LOW (ref 4.22–5.81)
RDW: 14.4 % (ref 11.5–15.5)
WBC: 7.2 10*3/uL (ref 4.0–10.5)
nRBC: 0 % (ref 0.0–0.2)

## 2022-05-13 LAB — TROPONIN I (HIGH SENSITIVITY)
Troponin I (High Sensitivity): 10 ng/L (ref <18)
Troponin I (High Sensitivity): 11 ng/L (ref <18)

## 2022-05-13 MED ORDER — ROPINIROLE HCL 1 MG PO TABS
1.0000 mg | ORAL_TABLET | Freq: Every day | ORAL | Status: DC
  Filled 2022-05-13: qty 1

## 2022-05-13 MED ORDER — CLOPIDOGREL BISULFATE 75 MG PO TABS
75.0000 mg | ORAL_TABLET | Freq: Every day | ORAL | Status: DC
  Filled 2022-05-13: qty 1

## 2022-05-13 MED ORDER — AMLODIPINE BESYLATE 5 MG PO TABS
5.0000 mg | ORAL_TABLET | Freq: Every day | ORAL | Status: DC
  Administered 2022-05-14: 5 mg via ORAL
  Filled 2022-05-13: qty 1

## 2022-05-13 MED ORDER — HEPARIN (PORCINE) 25000 UT/250ML-% IV SOLN
1500.0000 [IU]/h | INTRAVENOUS | Status: DC
  Administered 2022-05-13: 1600 [IU]/h via INTRAVENOUS
  Filled 2022-05-13 (×2): qty 250

## 2022-05-13 MED ORDER — HEPARIN BOLUS VIA INFUSION
4000.0000 [IU] | Freq: Once | INTRAVENOUS | Status: AC
  Administered 2022-05-13: 4000 [IU] via INTRAVENOUS

## 2022-05-13 MED ORDER — LOSARTAN POTASSIUM 25 MG PO TABS
50.0000 mg | ORAL_TABLET | Freq: Every day | ORAL | Status: DC
  Administered 2022-05-14: 50 mg via ORAL
  Filled 2022-05-13: qty 2

## 2022-05-13 MED ORDER — MORPHINE SULFATE (PF) 2 MG/ML IV SOLN: 1.0000 mg | INTRAVENOUS | Status: DC | PRN

## 2022-05-13 MED ORDER — ALLOPURINOL 100 MG PO TABS
100.0000 mg | ORAL_TABLET | Freq: Every day | ORAL | Status: DC
  Administered 2022-05-14: 100 mg via ORAL
  Filled 2022-05-13: qty 1

## 2022-05-13 MED ORDER — FESOTERODINE FUMARATE ER 4 MG PO TB24
4.0000 mg | ORAL_TABLET | Freq: Every day | ORAL | Status: DC
  Filled 2022-05-13: qty 1

## 2022-05-13 MED ORDER — TAMSULOSIN HCL 0.4 MG PO CAPS: 0.4000 mg | ORAL_CAPSULE | Freq: Every day | ORAL | Status: DC

## 2022-05-13 MED ORDER — IOHEXOL 350 MG/ML SOLN
80.0000 mL | Freq: Once | INTRAVENOUS | Status: AC | PRN
  Administered 2022-05-13: 80 mL via INTRAVENOUS

## 2022-05-13 MED ORDER — PANTOPRAZOLE SODIUM 40 MG PO TBEC
40.0000 mg | DELAYED_RELEASE_TABLET | Freq: Two times a day (BID) | ORAL | Status: DC
  Administered 2022-05-14: 40 mg via ORAL
  Filled 2022-05-13: qty 1
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
## 2022-05-13 NOTE — ED Triage Notes (Signed)
Pt here from home with a known blood clot in the right leg , pt has been having some sob recently but always has some sob ,

## 2022-05-13 NOTE — Progress Notes (Signed)
This is a 81 year old male with past medical history of Arthritis, Chronic lower back pain, CKD (chronic kidney disease) stage 3, GFR 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 (HCC) (07/09/2018), Corneal injury (1980s), GERD (gastroesophageal reflux disease), colonic polyps, Hyperlipidemia, Hypertension, Insomnia, Restless leg syndrome, Skin cancer (12/2016), Urgency of urination, and Urinary hesitancy.   He had a recent patella fracture that rendered him immobile, he developed a DVT.  At baseline patient does admit but presented to [facility] with worsening dyspnea.    In ER patient dyspneic with respiratory rates in the 20s -30s, occasionally in the 40s, not hypoxic.   Workup revealed DVT and PE. Lower extremity Doppler on the right reveals  acute deep vein thrombosis involving the right common femoral vein, right femoral vein, right proximal profunda vein,  right popliteal vein, and right posterior tibial veins.   CT venogram done and did show clot going up into the iliac on the right side, partially occlusive  CTA chest revels focus of nonocclusive embolus within the distal right pulmonary artery as well as bandlike embolus in the right lower lobe pulmonary artery.   EDP discussed with Dr. Unk Lightning from vascular surgery. N.p.o. at midnight in case intervention needs to be done tomorrow. Will see as a consult.

## 2022-05-14 DIAGNOSIS — Z86711 Personal history of pulmonary embolism: Secondary | ICD-10-CM | POA: Diagnosis present

## 2022-05-14 DIAGNOSIS — I2699 Other pulmonary embolism without acute cor pulmonale: Secondary | ICD-10-CM | POA: Diagnosis present

## 2022-05-14 DIAGNOSIS — I82491 Acute embolism and thrombosis of other specified deep vein of right lower extremity: Secondary | ICD-10-CM | POA: Diagnosis not present

## 2022-05-14 DIAGNOSIS — I82521 Chronic embolism and thrombosis of right iliac vein: Secondary | ICD-10-CM | POA: Diagnosis not present

## 2022-05-14 LAB — HEPARIN LEVEL (UNFRACTIONATED): Heparin Unfractionated: 0.72 IU/mL — ABNORMAL HIGH (ref 0.30–0.70)

## 2022-05-14 MED ORDER — RIVAROXABAN 20 MG PO TABS: 20.0000 mg | ORAL_TABLET | Freq: Every day | ORAL | Status: DC

## 2022-05-14 MED ORDER — RIVAROXABAN 15 MG PO TABS: 15.0000 mg | ORAL_TABLET | Freq: Two times a day (BID) | ORAL | Status: DC

## 2022-05-14 MED ORDER — RIVAROXABAN 15 MG PO TABS
15.0000 mg | ORAL_TABLET | Freq: Two times a day (BID) | ORAL | Status: DC
  Administered 2022-05-14: 15 mg via ORAL
  Filled 2022-05-14: qty 1

## 2022-05-14 MED ORDER — RIVAROXABAN (XARELTO) VTE STARTER PACK (15 & 20 MG): ORAL_TABLET | ORAL | 0 refills | Status: DC

## 2022-05-14 MED ORDER — RIVAROXABAN (XARELTO) EDUCATION KIT FOR DVT/PE PATIENTS: PACK | Freq: Once | Status: AC

## 2022-05-14 NOTE — ED Provider Notes (Signed)
6:05 PM patient seen in the emergency department yesterday and admitted for DVT and small PE without heart strain suspected.  Vascular surgery consult was performed here at [place].  I personally spoke with Dr. Unk Lightning and with ED attending Dr. Jeanell Sparrow regarding possibility for discharge home.  Vascular consultation was the main reason for admission to the hospital.  Patient has been stable during his ED stay of greater than 24 hours.  Vascular surgery does not recommend any procedures for the clot, per their consult note.  Patient will be started on Xarelto for pulmonary embolism.  He will continue on Plavix.  Patient counseled personally on bleeding risk, signs and symptoms to look out for, and when to seek medical care.  He verbalizes understanding and agrees with plan.  He is encouraged to return to the emergency department with worsening chest pain, shortness of breath, passing out, or other concerns.  Cardiology referral made on patient's behalf.  BP (!) 173/97   Pulse 85   Temp 97.9 F (36.6 C) (Oral)   Resp (!) 22   Ht 6' (1.829 m)   Wt 117.9 kg   SpO2 99%   BMI 35.26 kg/m Carlisle Cater, PA-C 05/14/22 1807    Pattricia Boss, MD 05/17/22 [phone]
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
## 2022-05-14 NOTE — ED Notes (Signed)
Called [facility] and spoke to [place]; informed that patient Bed Assignment is ready

## 2022-05-14 NOTE — ED Notes (Signed)
Vascular Surgeon in to consult with patient. Reports that patient can be discharged with referrals and medications.

## 2022-05-14 NOTE — Discharge Instructions (Addendum)
You have been cleared for discharge after being seen by vascular surgery here today.  You do not seem to be having any significant stress around your heart, so we feel that you can go home tonight on the new anticoagulant medication.  This medication will increase your risk of bleeding.  If you hit your head you need to come be seen at the emergency department.  If you notice blood in your stool, urine --you need to follow-up with your primary care doctor or return to the emergency department.  Return if you have worsening chest pain, difficulty breathing, passing out, or other concerns.
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
## 2022-05-14 NOTE — Progress Notes (Signed)
[place] for heparin  Indication: DVT and PE   Patient Measurements: Height: 6' (182.9 cm) Weight: 117.9 kg (260 lb) IBW/kg (Calculated) : 77.6 Heparin Dosing Weight: 103.3kg   Vital Signs: Temp: 98 F (36.7 C) (12/19 0600) Temp Source: Oral (12/19 0600) BP: 175/85 (12/19 0800) Pulse Rate: 78 (12/19 0800)  Labs: Recent Labs    05/13/22 1637 05/13/22 1829 05/14/22 0610  HGB 12.3*  --   --   HCT 35.7*  --   --   PLT 200  --   --   HEPARINUNFRC  --   --  0.72*  CREATININE 1.55*  --   --   TROPONINIHS 10 11  --      Estimated Creatinine Clearance: 50.4 mL/min (A) (by C-G formula based on SCr of 1.55 mg/dL (H)).  Assessment: Patient found to have PE and DVT. Not on thinners PTA. CBC stable. Pharmacy consulted to dose heparin. Initial heparin level is slightly above goal. No bleeding noted.   Goal of Therapy:  Heparin level 0.3-0.7 units/ml Monitor platelets by anticoagulation protocol: Yes   Plan:  Reduce heparin gtt to 1500 units/hr Check a heparin level this evening Daily heparin level and CBC  Salome Arnt, PharmD, BCPS, BCEMP Clinical Pharmacist Please see AMION for all pharmacy numbers 05/14/2022 8:36 AM

## 2022-05-14 NOTE — Progress Notes (Signed)
Subjective: Patient presented to the office yesterday 05/13/22 for a routine follow up visit to evaluate how he is doing in his recovery process from a right patella fracture in September that is being treated non-op. He was in a knee immobilizer and NWB for 6 weeks to allow healing. When he was seen yesterday, he reported increased pain and swelling of the right leg. We were concerned about the possibility of a DVT so we sent him for a doppler ultrasound of the right leg.   I received a call from the vascular ultrasound tech around 3:00pm stating the patient was still in the vascular lab and was positive for multiple right leg DVTs. She reported that he was also having SOB and was unable to tell if it was getting worse. She told me it was all he could do to talk to her. She asked what I wanted to do treatment wise for him. Due to the presence of multiple DVTs and his SOB, I recommended he go to the ER to be further evaluated as I was concerned for a PE.   Objective:   VITALS:   Vitals:   05/14/22 0700 05/14/22 0730 05/14/22 0800 05/14/22 0830  BP: (!) 160/91 (!) 176/86 (!) 175/85 (!) 186/93  Pulse: 83 81 78 78  Resp: (!) 25 (!) 25 (!) 23 (!) 25  Temp:      TempSrc:      SpO2: 94% 95% 97% 95%  Weight:      Height:          Latest Ref Rng & Units 05/13/2022    4:37 PM 07/19/2021    2:20 PM 01/10/2021    8:59 AM  CBC  WBC 4.0 - 10.5 K/uL 7.2  6.5  6.6   Hemoglobin 13.0 - 17.0 g/dL 12.3  12.1  12.6   Hematocrit 39.0 - 52.0 % 35.7  35.1  36.6   Platelets 150 - 400 K/uL 200  189.0  200.0       Latest Ref Rng & Units 05/13/2022    4:37 PM 01/18/2022    3:06 PM 07/19/2021    2:20 PM  BMP  Glucose 70 - 99 mg/dL 107  124  80   BUN 8 - 23 mg/dL '17  22  23   '$ Creatinine 0.61 - 1.24 mg/dL 1.55  1.91  1.69   Sodium 135 - 145 mmol/L 137  142  143   Potassium 3.5 - 5.1 mmol/L 4.0  4.4  4.4   Chloride 98 - 111 mmol/L 105  108  109   CO2 22 - 32 mmol/L '18  26  27   '$ Calcium 8.9 - 10.3 mg/dL  9.1  9.0  9.1    Intake/Output    None     VAS [place] LOWER EXTREMITY VENOUS 05/13/22 Summary:  RIGHT:  - Findings consistent with acute deep vein thrombosis involving the right  common femoral vein, right femoral vein, right proximal profunda vein,  right popliteal vein, and right posterior tibial veins.     CT Angio Chest 05/13/22 IMPRESSION: 1. Focus of nonocclusive embolus within the distal right pulmonary artery as well as bandlike embolus in the right lower lobe pulmonary artery. This may be nonacute given appearance, although pulmonary embolus is strictly age indeterminate by CT. 2. Trace bilateral pleural effusions. 3. Mild, diffuse bilateral bronchial wall thickening, consistent with nonspecific infectious or inflammatory bronchitis. 4. Global cardiomegaly and coronary artery disease.     Assessment:  Principal Problem:   DVT (deep venous thrombosis) (HCC) Active Problems:   Pulmonary embolus, right ([place])   Plan: Will be admitted to medicine at [facility], awaiting transport ED consulted Vascular team  Up with therapy once transferred to [place] and Apply ice  Weightbearing: WBAT RLE and LLE Orthopedic device(s): None Showering: Keep dressing dry VTE prophylaxis: per primary and vascular, currently on Heparin drip, ambulation Pain control: per primary, try to limit heavy narcotics due to age and fall risk Follow - up plan: 2 weeks in the office Contact information:  Edmonia Lynch MD, Aggie Moats PA-C  Dispo:  TBD. No orthopedic need to keep him inpatient so ok to d/c from our standpoint when medically stable. Call if needed.    Britt Bottom, PA-C Office [phone] 05/14/2022, 10:10 AM

## 2022-05-21 DIAGNOSIS — M6281 Muscle weakness (generalized): Secondary | ICD-10-CM | POA: Diagnosis not present

## 2022-05-21 DIAGNOSIS — R262 Difficulty in walking, not elsewhere classified: Secondary | ICD-10-CM | POA: Diagnosis not present

## 2022-05-21 DIAGNOSIS — S82041D Displaced comminuted fracture of right patella, subsequent encounter for closed fracture with routine healing: Secondary | ICD-10-CM | POA: Diagnosis not present

## 2022-05-21 DIAGNOSIS — M25661 Stiffness of right knee, not elsewhere classified: Secondary | ICD-10-CM | POA: Diagnosis not present

## 2022-05-23 DIAGNOSIS — L570 Actinic keratosis: Secondary | ICD-10-CM | POA: Diagnosis not present

## 2022-05-23 DIAGNOSIS — L814 Other melanin hyperpigmentation: Secondary | ICD-10-CM | POA: Diagnosis not present

## 2022-05-23 DIAGNOSIS — D485 Neoplasm of uncertain behavior of skin: Secondary | ICD-10-CM | POA: Diagnosis not present

## 2022-05-23 DIAGNOSIS — D1801 Hemangioma of skin and subcutaneous tissue: Secondary | ICD-10-CM | POA: Diagnosis not present

## 2022-05-23 DIAGNOSIS — L821 Other seborrheic keratosis: Secondary | ICD-10-CM | POA: Diagnosis not present

## 2022-05-23 DIAGNOSIS — L820 Inflamed seborrheic keratosis: Secondary | ICD-10-CM | POA: Diagnosis not present

## 2022-05-23 DIAGNOSIS — Z85828 Personal history of other malignant neoplasm of skin: Secondary | ICD-10-CM | POA: Diagnosis not present

## 2022-05-24 DIAGNOSIS — M6281 Muscle weakness (generalized): Secondary | ICD-10-CM | POA: Diagnosis not present

## 2022-05-24 DIAGNOSIS — R262 Difficulty in walking, not elsewhere classified: Secondary | ICD-10-CM | POA: Diagnosis not present

## 2022-05-24 DIAGNOSIS — M25661 Stiffness of right knee, not elsewhere classified: Secondary | ICD-10-CM | POA: Diagnosis not present

## 2022-05-24 DIAGNOSIS — S82041D Displaced comminuted fracture of right patella, subsequent encounter for closed fracture with routine healing: Secondary | ICD-10-CM | POA: Diagnosis not present

## 2022-05-28 ENCOUNTER — Encounter: Payer: Self-pay | Admitting: Internal Medicine

## 2022-05-28 ENCOUNTER — Ambulatory Visit: Payer: Medicare Other | Attending: Internal Medicine | Admitting: Internal Medicine

## 2022-05-28 VITALS — BP 111/59 | HR 87 | Ht 72.0 in | Wt 255.6 lb

## 2022-05-28 DIAGNOSIS — I2699 Other pulmonary embolism without acute cor pulmonale: Secondary | ICD-10-CM | POA: Diagnosis not present

## 2022-05-28 DIAGNOSIS — R0602 Shortness of breath: Secondary | ICD-10-CM | POA: Insufficient documentation

## 2022-05-28 NOTE — Patient Instructions (Addendum)
Medication Instructions:   STOP: ASPIRIN AND PLAVIX (CLOPIDOGREL)- FOR NOW DO NOT RETAKE ASPIRIN UNTIL YOU ARE DONE WITH XARELTO  DO NOT TAKE LOSARTAN '50mg'$  DAILY OR AMLODIPINE '5mg'$  UNTIL YOU SEE YOUR PRIMARY CARE DR.   *If you need a refill on your cardiac medications before your next appointment, please call your pharmacy*  Lab Work: None Ordered At This Time.  If you have labs (blood work) drawn today and your tests are completely normal, you will receive your results only by: [place] (if you have MyChart) OR A paper copy in the mail If you have any lab test that is abnormal or we need to change your treatment, we will call you to review the results.  Testing/Procedures: Your physician has requested that you have an echocardiogram. Echocardiography is a painless test that uses sound waves to create images of your heart. It provides your doctor with information about the size and shape of your heart and how well your heart's chambers and valves are working. You may receive an ultrasound enhancing agent through an IV if needed to better visualize your heart during the echo.This procedure takes approximately one hour. There are no restrictions for this procedure. This will take place at the [address], [address].   Follow-Up: At [facility], you and your health needs are our priority.  As part of our continuing mission to provide you with exceptional heart care, we have created designated Provider Care Teams.  These Care Teams include your primary Cardiologist (physician) and Advanced Practice Providers (APPs -  Physician Assistants and Nurse Practitioners) who all work together to provide you with the care you need, when you need it.    Your next appointment:   AS NEEDED   The format for your next appointment:   In Person  Provider:   Janina Mayo, MD
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
## 2022-05-31 ENCOUNTER — Other Ambulatory Visit: Payer: Self-pay | Admitting: *Deleted

## 2022-05-31 ENCOUNTER — Ambulatory Visit (HOSPITAL_COMMUNITY)
Admission: RE | Admit: 2022-05-31 | Discharge: 2022-05-31 | Disposition: A | Discharge status: Home / Self Care | Payer: Medicare Other | Source: Ambulatory Visit | Attending: Vascular Surgery | Admitting: Vascular Surgery

## 2022-05-31 ENCOUNTER — Ambulatory Visit (INDEPENDENT_AMBULATORY_CARE_PROVIDER_SITE_OTHER): Payer: Medicare Other | Admitting: Physician Assistant

## 2022-05-31 VITALS — HR 61 | Temp 97.9°F | Resp 20 | Ht 72.0 in | Wt 254.9 lb

## 2022-05-31 DIAGNOSIS — M7989 Other specified soft tissue disorders: Secondary | ICD-10-CM

## 2022-05-31 DIAGNOSIS — I82401 Acute embolism and thrombosis of unspecified deep veins of right lower extremity: Secondary | ICD-10-CM

## 2022-05-31 MED ORDER — RIVAROXABAN 20 MG PO TABS: 20.0000 mg | ORAL_TABLET | Freq: Every day | ORAL | 0 refills | Status: DC
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
## 2022-06-03 DIAGNOSIS — S82041D Displaced comminuted fracture of right patella, subsequent encounter for closed fracture with routine healing: Secondary | ICD-10-CM | POA: Diagnosis not present

## 2022-06-03 DIAGNOSIS — M25661 Stiffness of right knee, not elsewhere classified: Secondary | ICD-10-CM | POA: Diagnosis not present

## 2022-06-03 DIAGNOSIS — R262 Difficulty in walking, not elsewhere classified: Secondary | ICD-10-CM | POA: Diagnosis not present

## 2022-06-03 DIAGNOSIS — M6281 Muscle weakness (generalized): Secondary | ICD-10-CM | POA: Diagnosis not present

## 2022-06-06 ENCOUNTER — Ambulatory Visit (INDEPENDENT_AMBULATORY_CARE_PROVIDER_SITE_OTHER): Payer: Medicare Other | Admitting: Internal Medicine

## 2022-06-06 VITALS — BP 132/68 | HR 102 | Temp 97.9°F | Ht 72.0 in | Wt 258.0 lb

## 2022-06-06 DIAGNOSIS — R7303 Prediabetes: Secondary | ICD-10-CM

## 2022-06-06 DIAGNOSIS — Z0001 Encounter for general adult medical examination with abnormal findings: Secondary | ICD-10-CM | POA: Diagnosis not present

## 2022-06-06 DIAGNOSIS — I10 Essential (primary) hypertension: Secondary | ICD-10-CM

## 2022-06-06 DIAGNOSIS — E559 Vitamin D deficiency, unspecified: Secondary | ICD-10-CM

## 2022-06-06 DIAGNOSIS — I498 Other specified cardiac arrhythmias: Secondary | ICD-10-CM

## 2022-06-06 DIAGNOSIS — E538 Deficiency of other specified B group vitamins: Secondary | ICD-10-CM | POA: Diagnosis not present

## 2022-06-06 DIAGNOSIS — I824Y1 Acute embolism and thrombosis of unspecified deep veins of right proximal lower extremity: Secondary | ICD-10-CM | POA: Diagnosis not present

## 2022-06-06 DIAGNOSIS — I2699 Other pulmonary embolism without acute cor pulmonale: Secondary | ICD-10-CM

## 2022-06-06 DIAGNOSIS — I739 Peripheral vascular disease, unspecified: Secondary | ICD-10-CM | POA: Diagnosis not present

## 2022-06-06 DIAGNOSIS — N183 Chronic kidney disease, stage 3 unspecified: Secondary | ICD-10-CM

## 2022-06-06 LAB — URINALYSIS, ROUTINE W REFLEX MICROSCOPIC
Bilirubin Urine: NEGATIVE
Hgb urine dipstick: NEGATIVE
Leukocytes,Ua: NEGATIVE
Nitrite: NEGATIVE
Specific Gravity, Urine: 1.02 (ref 1.000–1.030)
Total Protein, Urine: NEGATIVE
Urine Glucose: NEGATIVE
Urobilinogen, UA: 0.2 (ref 0.0–1.0)
pH: 6.5 (ref 5.0–8.0)

## 2022-06-06 LAB — CBC WITH DIFFERENTIAL/PLATELET
Basophils Absolute: 0 10*3/uL (ref 0.0–0.1)
Basophils Relative: 0.6 % (ref 0.0–3.0)
Eosinophils Absolute: 0.3 10*3/uL (ref 0.0–0.7)
Eosinophils Relative: 3.9 % (ref 0.0–5.0)
HCT: 35.9 % — ABNORMAL LOW (ref 39.0–52.0)
Hemoglobin: 12.2 g/dL — ABNORMAL LOW (ref 13.0–17.0)
Lymphocytes Relative: 33 % (ref 12.0–46.0)
Lymphs Abs: 2.2 10*3/uL (ref 0.7–4.0)
MCHC: 34 g/dL (ref 30.0–36.0)
MCV: 85.7 fl (ref 78.0–100.0)
Monocytes Absolute: 0.6 10*3/uL (ref 0.1–1.0)
Monocytes Relative: 8.9 % (ref 3.0–12.0)
Neutro Abs: 3.7 10*3/uL (ref 1.4–7.7)
Neutrophils Relative %: 53.6 % (ref 43.0–77.0)
Platelets: 208 10*3/uL (ref 150.0–400.0)
RBC: 4.18 Mil/uL — ABNORMAL LOW (ref 4.22–5.81)
RDW: 15.1 % (ref 11.5–15.5)
WBC: 6.8 10*3/uL (ref 4.0–10.5)

## 2022-06-06 LAB — VITAMIN B12: Vitamin B-12: 893 pg/mL (ref 211–911)

## 2022-06-06 LAB — VITAMIN D 25 HYDROXY (VIT D DEFICIENCY, FRACTURES): VITD: 30.58 ng/mL (ref 30.00–100.00)

## 2022-06-06 LAB — TSH: TSH: 2.44 u[IU]/mL (ref 0.35–5.50)

## 2022-06-06 LAB — HEMOGLOBIN A1C: Hgb A1c MFr Bld: 6.5 % (ref 4.6–6.5)

## 2022-06-06 NOTE — Patient Instructions (Signed)
Please continue all other medications as before, and refills have been done if requested.  Please have the pharmacy call with any other refills you may need.  Please continue your efforts at being more active, low cholesterol diet, and weight control.  You are otherwise up to date with prevention measures today.  Please keep your appointments with your specialists as you may have planned - vascular surgury soon  Please go to the LAB at the blood drawing area for the tests to be done  You will be contacted by phone if any changes need to be made immediately.  Otherwise, you will receive a letter about your results with an explanation, but please check with MyChart first.  Please remember to sign up for MyChart if you have not done so, as this will be important to you in the future with finding out test results, communicating by private email, and scheduling acute appointments online when needed.  Please make an Appointment to return in 3 months, or sooner if needed,
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
## 2022-06-07 ENCOUNTER — Encounter: Payer: Self-pay | Admitting: Internal Medicine

## 2022-06-07 LAB — HEPATIC FUNCTION PANEL
ALT: 17 U/L (ref 0–53)
AST: 16 U/L (ref 0–37)
Albumin: 3.9 g/dL (ref 3.5–5.2)
Alkaline Phosphatase: 97 U/L (ref 39–117)
Bilirubin, Direct: 0.1 mg/dL (ref 0.0–0.3)
Total Bilirubin: 0.4 mg/dL (ref 0.2–1.2)
Total Protein: 6.5 g/dL (ref 6.0–8.3)

## 2022-06-07 LAB — BASIC METABOLIC PANEL
BUN: 17 mg/dL (ref 6–23)
CO2: 24 mEq/L (ref 19–32)
Calcium: 8.9 mg/dL (ref 8.4–10.5)
Chloride: 107 mEq/L (ref 96–112)
Creatinine, Ser: 2.09 mg/dL — ABNORMAL HIGH (ref 0.40–1.50)
GFR: 29.33 mL/min — ABNORMAL LOW (ref >60.00)
Glucose, Bld: 111 mg/dL — ABNORMAL HIGH (ref 70–99)
Potassium: 4.6 mEq/L (ref 3.5–5.1)
Sodium: 142 mEq/L (ref 135–145)

## 2022-06-07 LAB — LIPID PANEL
Cholesterol: 128 mg/dL (ref 0–200)
HDL: 53.5 mg/dL (ref >39.00)
LDL Cholesterol: 52 mg/dL (ref 0–99)
NonHDL: 74.33
Total CHOL/HDL Ratio: 2
Triglycerides: 110 mg/dL (ref 0.0–149.0)
VLDL: 22 mg/dL (ref 0.0–40.0)

## 2022-06-07 NOTE — Assessment & Plan Note (Addendum)
BP Readings from Last 3 Encounters:  06/06/22 132/68  05/28/22 (!) 111/59  05/14/22 (!) 139/108   Stable, pt to continue medical treatment  - diet , wt control  - currently not taking recent amlodipine, and losartan

## 2022-06-07 NOTE — Assessment & Plan Note (Signed)
Lab Results  Component Value Date   HGBA1C 6.5 01/18/2022   Stable, pt to continue current medical treatment  - diet, wt control

## 2022-06-07 NOTE — Assessment & Plan Note (Signed)
Also for f/u vascular surgury appt soon

## 2022-06-07 NOTE — Assessment & Plan Note (Signed)
For f/u echo as planned likely needs long term xarleto 10 mg

## 2022-06-07 NOTE — Assessment & Plan Note (Signed)
Last vitamin D Lab Results  Component Value Date   VD25OH 36.18 07/19/2021   Low, reminded to start oral replacement

## 2022-06-07 NOTE — Assessment & Plan Note (Signed)
Lab Results  Component Value Date   CREATININE 1.55 (H) 05/13/2022   Stable overall, cont to avoid nephrotoxins

## 2022-06-07 NOTE — Assessment & Plan Note (Signed)

## 2022-06-07 NOTE — Assessment & Plan Note (Signed)
Stable to improved right leg swelling and pain, for contd xarelto 20 mg qd x 6 mo, then likely need 10 mg qd after

## 2022-06-10 DIAGNOSIS — M6281 Muscle weakness (generalized): Secondary | ICD-10-CM | POA: Diagnosis not present

## 2022-06-10 DIAGNOSIS — S82041D Displaced comminuted fracture of right patella, subsequent encounter for closed fracture with routine healing: Secondary | ICD-10-CM | POA: Diagnosis not present

## 2022-06-10 DIAGNOSIS — R262 Difficulty in walking, not elsewhere classified: Secondary | ICD-10-CM | POA: Diagnosis not present

## 2022-06-10 DIAGNOSIS — M25661 Stiffness of right knee, not elsewhere classified: Secondary | ICD-10-CM | POA: Diagnosis not present

## 2022-06-13 DIAGNOSIS — S82041D Displaced comminuted fracture of right patella, subsequent encounter for closed fracture with routine healing: Secondary | ICD-10-CM | POA: Diagnosis not present

## 2022-06-13 DIAGNOSIS — M25661 Stiffness of right knee, not elsewhere classified: Secondary | ICD-10-CM | POA: Diagnosis not present

## 2022-06-13 DIAGNOSIS — M6281 Muscle weakness (generalized): Secondary | ICD-10-CM | POA: Diagnosis not present

## 2022-06-13 DIAGNOSIS — R262 Difficulty in walking, not elsewhere classified: Secondary | ICD-10-CM | POA: Diagnosis not present

## 2022-06-17 DIAGNOSIS — S82041D Displaced comminuted fracture of right patella, subsequent encounter for closed fracture with routine healing: Secondary | ICD-10-CM | POA: Diagnosis not present

## 2022-06-20 ENCOUNTER — Ambulatory Visit (HOSPITAL_COMMUNITY)
Admission: RE | Admit: 2022-06-20 | Discharge: 2022-06-20 | Disposition: A | Discharge status: Home / Self Care | Payer: Medicare Other | Source: Ambulatory Visit | Attending: Vascular Surgery | Admitting: Vascular Surgery

## 2022-06-20 DIAGNOSIS — M79661 Pain in right lower leg: Secondary | ICD-10-CM | POA: Insufficient documentation

## 2022-06-20 DIAGNOSIS — I82491 Acute embolism and thrombosis of other specified deep vein of right lower extremity: Secondary | ICD-10-CM

## 2022-06-20 DIAGNOSIS — M79662 Pain in left lower leg: Secondary | ICD-10-CM | POA: Diagnosis not present

## 2022-06-20 DIAGNOSIS — M25572 Pain in left ankle and joints of left foot: Secondary | ICD-10-CM | POA: Diagnosis not present

## 2022-06-21 ENCOUNTER — Telehealth: Payer: Self-pay

## 2022-06-21 NOTE — Telephone Encounter (Signed)
Left message for patient to call back to schedule Medicare Annual Wellness Visit   Last AWV  06/12/21  Please schedule at anytime with [facility] if patient calls the office back.    30 Minutes appointment   Any questions, please call me at [phone]

## 2022-06-25 ENCOUNTER — Ambulatory Visit (HOSPITAL_COMMUNITY): Payer: Medicare Other | Attending: Internal Medicine

## 2022-06-25 DIAGNOSIS — I2699 Other pulmonary embolism without acute cor pulmonale: Secondary | ICD-10-CM | POA: Diagnosis not present

## 2022-06-25 DIAGNOSIS — R0602 Shortness of breath: Secondary | ICD-10-CM

## 2022-06-26 ENCOUNTER — Encounter (INDEPENDENT_AMBULATORY_CARE_PROVIDER_SITE_OTHER): Payer: Self-pay | Admitting: Vascular Surgery

## 2022-06-26 LAB — ECHOCARDIOGRAM COMPLETE
Area-P 1/2: 6.71 cm2
MV M vel: 4.09 m/s
MV Peak grad: 66.9 mmHg
S' Lateral: 4.2 cm

## 2022-06-26 NOTE — Telephone Encounter (Signed)
Can you see about adding a DVT study to his follow up

## 2022-07-02 ENCOUNTER — Other Ambulatory Visit (INDEPENDENT_AMBULATORY_CARE_PROVIDER_SITE_OTHER): Payer: Self-pay | Admitting: Nurse Practitioner

## 2022-07-02 DIAGNOSIS — M25661 Stiffness of right knee, not elsewhere classified: Secondary | ICD-10-CM | POA: Diagnosis not present

## 2022-07-02 DIAGNOSIS — I739 Peripheral vascular disease, unspecified: Secondary | ICD-10-CM

## 2022-07-02 DIAGNOSIS — I82401 Acute embolism and thrombosis of unspecified deep veins of right lower extremity: Secondary | ICD-10-CM

## 2022-07-02 DIAGNOSIS — R262 Difficulty in walking, not elsewhere classified: Secondary | ICD-10-CM | POA: Diagnosis not present

## 2022-07-02 DIAGNOSIS — M6281 Muscle weakness (generalized): Secondary | ICD-10-CM | POA: Diagnosis not present

## 2022-07-02 DIAGNOSIS — S82041D Displaced comminuted fracture of right patella, subsequent encounter for closed fracture with routine healing: Secondary | ICD-10-CM | POA: Diagnosis not present

## 2022-07-04 DIAGNOSIS — M6281 Muscle weakness (generalized): Secondary | ICD-10-CM | POA: Diagnosis not present

## 2022-07-04 DIAGNOSIS — S82041D Displaced comminuted fracture of right patella, subsequent encounter for closed fracture with routine healing: Secondary | ICD-10-CM | POA: Diagnosis not present

## 2022-07-04 DIAGNOSIS — R262 Difficulty in walking, not elsewhere classified: Secondary | ICD-10-CM | POA: Diagnosis not present

## 2022-07-04 DIAGNOSIS — M25661 Stiffness of right knee, not elsewhere classified: Secondary | ICD-10-CM | POA: Diagnosis not present
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
## 2022-07-08 ENCOUNTER — Ambulatory Visit (INDEPENDENT_AMBULATORY_CARE_PROVIDER_SITE_OTHER): Payer: Medicare Other

## 2022-07-08 ENCOUNTER — Ambulatory Visit (INDEPENDENT_AMBULATORY_CARE_PROVIDER_SITE_OTHER): Payer: Medicare Other | Admitting: Vascular Surgery

## 2022-07-08 ENCOUNTER — Encounter (INDEPENDENT_AMBULATORY_CARE_PROVIDER_SITE_OTHER): Payer: Self-pay | Admitting: Vascular Surgery

## 2022-07-08 VITALS — BP 126/68 | HR 81 | Ht 72.0 in | Wt 254.0 lb

## 2022-07-08 DIAGNOSIS — I739 Peripheral vascular disease, unspecified: Secondary | ICD-10-CM | POA: Diagnosis not present

## 2022-07-08 DIAGNOSIS — Z9889 Other specified postprocedural states: Secondary | ICD-10-CM

## 2022-07-08 DIAGNOSIS — I10 Essential (primary) hypertension: Secondary | ICD-10-CM | POA: Diagnosis not present

## 2022-07-08 DIAGNOSIS — I82401 Acute embolism and thrombosis of unspecified deep veins of right lower extremity: Secondary | ICD-10-CM | POA: Diagnosis not present

## 2022-07-08 DIAGNOSIS — I824Y1 Acute embolism and thrombosis of unspecified deep veins of right proximal lower extremity: Secondary | ICD-10-CM | POA: Diagnosis not present

## 2022-07-08 DIAGNOSIS — M519 Unspecified thoracic, thoracolumbar and lumbosacral intervertebral disc disorder: Secondary | ICD-10-CM

## 2022-07-08 DIAGNOSIS — I70213 Atherosclerosis of native arteries of extremities with intermittent claudication, bilateral legs: Secondary | ICD-10-CM

## 2022-07-08 DIAGNOSIS — E782 Mixed hyperlipidemia: Secondary | ICD-10-CM | POA: Diagnosis not present

## 2022-07-08 LAB — VAS US ABI WITH/WO TBI
Left ABI: 1.13
Right ABI: 1.1

## 2022-07-09 DIAGNOSIS — H43813 Vitreous degeneration, bilateral: Secondary | ICD-10-CM | POA: Diagnosis not present

## 2022-07-09 DIAGNOSIS — H2513 Age-related nuclear cataract, bilateral: Secondary | ICD-10-CM | POA: Diagnosis not present

## 2022-07-09 DIAGNOSIS — H25013 Cortical age-related cataract, bilateral: Secondary | ICD-10-CM | POA: Diagnosis not present

## 2022-07-09 DIAGNOSIS — H5213 Myopia, bilateral: Secondary | ICD-10-CM | POA: Diagnosis not present

## 2022-07-09 DIAGNOSIS — H524 Presbyopia: Secondary | ICD-10-CM | POA: Diagnosis not present

## 2022-07-10 DIAGNOSIS — M6281 Muscle weakness (generalized): Secondary | ICD-10-CM | POA: Diagnosis not present

## 2022-07-10 DIAGNOSIS — S82041D Displaced comminuted fracture of right patella, subsequent encounter for closed fracture with routine healing: Secondary | ICD-10-CM | POA: Diagnosis not present

## 2022-07-10 DIAGNOSIS — M25661 Stiffness of right knee, not elsewhere classified: Secondary | ICD-10-CM | POA: Diagnosis not present

## 2022-07-10 DIAGNOSIS — R262 Difficulty in walking, not elsewhere classified: Secondary | ICD-10-CM | POA: Diagnosis not present

## 2022-07-11 ENCOUNTER — Encounter (INDEPENDENT_AMBULATORY_CARE_PROVIDER_SITE_OTHER): Payer: Self-pay | Admitting: Vascular Surgery

## 2022-07-22 ENCOUNTER — Other Ambulatory Visit: Payer: Self-pay

## 2022-07-22 ENCOUNTER — Ambulatory Visit: Payer: Medicare Other | Admitting: Internal Medicine

## 2022-07-22 MED ORDER — ROSUVASTATIN CALCIUM 40 MG PO TABS: 40.0000 mg | ORAL_TABLET | Freq: Every day | ORAL | 3 refills | Status: DC

## 2022-07-23 ENCOUNTER — Encounter: Payer: Self-pay | Admitting: Internal Medicine

## 2022-07-23 DIAGNOSIS — M25661 Stiffness of right knee, not elsewhere classified: Secondary | ICD-10-CM | POA: Diagnosis not present

## 2022-07-23 DIAGNOSIS — M6281 Muscle weakness (generalized): Secondary | ICD-10-CM | POA: Diagnosis not present

## 2022-07-23 DIAGNOSIS — S82041D Displaced comminuted fracture of right patella, subsequent encounter for closed fracture with routine healing: Secondary | ICD-10-CM | POA: Diagnosis not present

## 2022-07-23 DIAGNOSIS — R262 Difficulty in walking, not elsewhere classified: Secondary | ICD-10-CM | POA: Diagnosis not present

## 2022-07-24 ENCOUNTER — Other Ambulatory Visit: Payer: Self-pay

## 2022-07-24 ENCOUNTER — Other Ambulatory Visit (HOSPITAL_COMMUNITY): Payer: Self-pay

## 2022-07-24 MED ORDER — ROPINIROLE HCL 1 MG PO TABS: 1.0000 mg | ORAL_TABLET | Freq: Every day | ORAL | 3 refills | Status: DC

## 2022-07-24 NOTE — Telephone Encounter (Signed)
Done erx 

## 2022-07-25 DIAGNOSIS — M6281 Muscle weakness (generalized): Secondary | ICD-10-CM | POA: Diagnosis not present

## 2022-07-25 DIAGNOSIS — R262 Difficulty in walking, not elsewhere classified: Secondary | ICD-10-CM | POA: Diagnosis not present

## 2022-07-25 DIAGNOSIS — S82041D Displaced comminuted fracture of right patella, subsequent encounter for closed fracture with routine healing: Secondary | ICD-10-CM | POA: Diagnosis not present

## 2022-07-25 DIAGNOSIS — M25661 Stiffness of right knee, not elsewhere classified: Secondary | ICD-10-CM | POA: Diagnosis not present

## 2022-07-28 DIAGNOSIS — Z23 Encounter for immunization: Secondary | ICD-10-CM | POA: Diagnosis not present

## 2022-07-30 DIAGNOSIS — E291 Testicular hypofunction: Secondary | ICD-10-CM | POA: Diagnosis not present

## 2022-07-30 DIAGNOSIS — N401 Enlarged prostate with lower urinary tract symptoms: Secondary | ICD-10-CM | POA: Diagnosis not present

## 2022-07-30 DIAGNOSIS — R350 Frequency of micturition: Secondary | ICD-10-CM | POA: Diagnosis not present

## 2022-07-31 ENCOUNTER — Ambulatory Visit (INDEPENDENT_AMBULATORY_CARE_PROVIDER_SITE_OTHER): Payer: Medicare Other

## 2022-07-31 VITALS — BP 110/65 | HR 83 | Temp 97.6°F | Ht 72.0 in | Wt 258.8 lb

## 2022-07-31 DIAGNOSIS — Z0000 Encounter for general adult medical examination without abnormal findings: Secondary | ICD-10-CM | POA: Diagnosis not present

## 2022-07-31 NOTE — Patient Instructions (Addendum)
Alex Kemp , Thank you for taking time to come for your Medicare Wellness Visit. I appreciate your ongoing commitment to your health goals. Please review the following plan we discussed and let me know if I can assist you in the future.   These are the goals we discussed:  Goals      Client understands the importance of follow-up with providers by attending scheduled visits        This is a list of the screening recommended for you and due dates:  Health Maintenance  Topic Date Due   COVID-19 Vaccine (10 - 2023-24 season) 09/23/2022   Medicare Annual Wellness Visit  07/31/2023   Colon Cancer Screening  05/16/2024   DTaP/Tdap/Td vaccine (4 - Td or Tdap) 01/07/2029   Pneumonia Vaccine  Completed   Flu Shot  Completed   Zoster (Shingles) Vaccine  Completed   HPV Vaccine  Aged Out   Immunization History  Administered Date(s) Administered   Covid-19, Mrna,Vaccine(Spikevax)17yr and older 02/18/2022   DTaP 01/03/2017   Fluad Quad(high Dose 65+) 02/18/2022   Hep A / Hep B 01/05/2018, 07/09/2018   Hepatitis B, ADULT 02/05/2018   Influenza Split 02/17/2012, 01/09/2016   Influenza Whole 03/17/2007, 05/31/2008, 03/30/2009, 01/26/2010   Influenza, High Dose Seasonal PF 02/25/2013, 03/14/2014, 03/05/2017, 02/19/2018, 01/08/2019, 01/08/2019, 02/01/2020, 01/15/2021   Influenza-Unspecified 02/14/2015, 01/11/2021   PFIZER Comirnaty(Gray Top)Covid-19 Tri-Sucrose Vaccine 10/09/2020, 07/29/2022   PFIZER(Purple Top)SARS-COV-2 Vaccination 03/04/2019, 04/08/2019, 01/25/2020, 05/25/2020   PNEUMOCOCCAL CONJUGATE-20 10/09/2020   Pneumococcal Conjugate-13 06/18/2013   Pneumococcal Polysaccharide-23 05/27/2006, 08/24/2012   Td 05/27/2006   Tdap 01/08/2019   Unspecified SARS-COV-2 Vaccination 02/18/2022   Zoster Recombinat (Shingrix) 01/08/2019, 01/08/2019, 06/04/2019    Advanced directives: Yes, documents are on file.  Conditions/risks identified: Yes  Next appointment: Follow up in one year for  your annual wellness visit.   Preventive Care 649Years and Older, Male  Preventive care refers to lifestyle choices and visits with your health care provider that can promote health and wellness. What does preventive care include? A yearly physical exam. This is also called an annual well check. Dental exams once or twice a year. Routine eye exams. Ask your health care provider how often you should have your eyes checked. Personal lifestyle choices, including: Daily care of your teeth and gums. Regular physical activity. Eating a healthy diet. Avoiding tobacco and drug use. Limiting alcohol use. Practicing safe sex. Taking low doses of aspirin every day. Taking vitamin and mineral supplements as recommended by your health care provider. What happens during an annual well check? The services and screenings done by your health care provider during your annual well check will depend on your age, overall health, lifestyle risk factors, and family history of disease. Counseling  Your health care provider may ask you questions about your: Alcohol use. Tobacco use. Drug use. Emotional well-being. Home and relationship well-being. Sexual activity. Eating habits. History of falls. Memory and ability to understand (cognition). Work and work eStatistician Screening  You may have the following tests or measurements: Height, weight, and BMI. Blood pressure. Lipid and cholesterol levels. These may be checked every 5 years, or more frequently if you are over 569years old. Skin check. Lung cancer screening. You may have this screening every year starting at age 1530if you have a 30-pack-year history of smoking and currently smoke or have quit within the past 15 years. Fecal occult blood test (FOBT) of the stool. You may have this test every year starting at  age 24. Flexible sigmoidoscopy or colonoscopy. You may have a sigmoidoscopy every 5 years or a colonoscopy every 10 years starting at age  73. Prostate cancer screening. Recommendations will vary depending on your family history and other risks. Hepatitis C blood test. Hepatitis B blood test. Sexually transmitted disease (STD) testing. Diabetes screening. This is done by checking your blood sugar (glucose) after you have not eaten for a while (fasting). You may have this done every 1-3 years. Abdominal aortic aneurysm (AAA) screening. You may need this if you are a current or former smoker. Osteoporosis. You may be screened starting at age 54 if you are at high risk. Talk with your health care provider about your test results, treatment options, and if necessary, the need for more tests. Vaccines  Your health care provider may recommend certain vaccines, such as: Influenza vaccine. This is recommended every year. Tetanus, diphtheria, and acellular pertussis (Tdap, Td) vaccine. You may need a Td booster every 10 years. Zoster vaccine. You may need this after age 59. Pneumococcal 13-valent conjugate (PCV13) vaccine. One dose is recommended after age 62. Pneumococcal polysaccharide (PPSV23) vaccine. One dose is recommended after age 24. Talk to your health care provider about which screenings and vaccines you need and how often you need them. This information is not intended to replace advice given to you by your health care provider. Make sure you discuss any questions you have with your health care provider. Document Released: 06/09/2015 Document Revised: 01/31/2016 Document Reviewed: 03/14/2015 Elsevier Interactive Patient Education  2017 [employer] Prevention in the Home Falls can cause injuries. They can happen to people of all ages. There are many things you can do to make your home safe and to help prevent falls. What can I do on the outside of my home? Regularly fix the edges of walkways and driveways and fix any cracks. Remove anything that might make you trip as you walk through a door, such as a raised step or  threshold. Trim any bushes or trees on the path to your home. Use bright outdoor lighting. Clear any walking paths of anything that might make someone trip, such as rocks or tools. Regularly check to see if handrails are loose or broken. Make sure that both sides of any steps have handrails. Any raised decks and porches should have guardrails on the edges. Have any leaves, snow, or ice cleared regularly. Use sand or salt on walking paths during winter. Clean up any spills in your garage right away. This includes oil or grease spills. What can I do in the bathroom? Use night lights. Install grab bars by the toilet and in the tub and shower. Do not use towel bars as grab bars. Use non-skid mats or decals in the tub or shower. If you need to sit down in the shower, use a plastic, non-slip stool. Keep the floor dry. Clean up any water that spills on the floor as soon as it happens. Remove soap buildup in the tub or shower regularly. Attach bath mats securely with double-sided non-slip rug tape. Do not have throw rugs and other things on the floor that can make you trip. What can I do in the bedroom? Use night lights. Make sure that you have a light by your bed that is easy to reach. Do not use any sheets or blankets that are too big for your bed. They should not hang down onto the floor. Have a firm chair that has side arms. You can  use this for support while you get dressed. Do not have throw rugs and other things on the floor that can make you trip. What can I do in the kitchen? Clean up any spills right away. Avoid walking on wet floors. Keep items that you use a lot in easy-to-reach places. If you need to reach something above you, use a strong step stool that has a grab bar. Keep electrical cords out of the way. Do not use floor polish or wax that makes floors slippery. If you must use wax, use non-skid floor wax. Do not have throw rugs and other things on the floor that can make you  trip. What can I do with my stairs? Do not leave any items on the stairs. Make sure that there are handrails on both sides of the stairs and use them. Fix handrails that are broken or loose. Make sure that handrails are as long as the stairways. Check any carpeting to make sure that it is firmly attached to the stairs. Fix any carpet that is loose or worn. Avoid having throw rugs at the top or bottom of the stairs. If you do have throw rugs, attach them to the floor with carpet tape. Make sure that you have a light switch at the top of the stairs and the bottom of the stairs. If you do not have them, ask someone to add them for you. What else can I do to help prevent falls? Wear shoes that: Do not have high heels. Have rubber bottoms. Are comfortable and fit you well. Are closed at the toe. Do not wear sandals. If you use a stepladder: Make sure that it is fully opened. Do not climb a closed stepladder. Make sure that both sides of the stepladder are locked into place. Ask someone to hold it for you, if possible. Clearly mark and make sure that you can see: Any grab bars or handrails. First and last steps. Where the edge of each step is. Use tools that help you move around (mobility aids) if they are needed. These include: Canes. Walkers. Scooters. Crutches. Turn on the lights when you go into a dark area. Replace any light bulbs as soon as they burn out. Set up your furniture so you have a clear path. Avoid moving your furniture around. If any of your floors are uneven, fix them. If there are any pets around you, be aware of where they are. Review your medicines with your doctor. Some medicines can make you feel dizzy. This can increase your chance of falling. Ask your doctor what other things that you can do to help prevent falls. This information is not intended to replace advice given to you by your health care provider. Make sure you discuss any questions you have with your  health care provider. Document Released: 03/09/2009 Document Revised: 10/19/2015 Document Reviewed: 06/17/2014 Elsevier Interactive Patient Education  2017 [employer].
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
## 2022-08-05 ENCOUNTER — Encounter: Payer: Self-pay | Admitting: Internal Medicine

## 2022-08-05 MED ORDER — TEMAZEPAM 30 MG PO CAPS: ORAL_CAPSULE | ORAL | 1 refills | Status: DC

## 2022-08-21 ENCOUNTER — Other Ambulatory Visit (INDEPENDENT_AMBULATORY_CARE_PROVIDER_SITE_OTHER): Payer: Self-pay | Admitting: Nurse Practitioner

## 2022-08-21 ENCOUNTER — Encounter (INDEPENDENT_AMBULATORY_CARE_PROVIDER_SITE_OTHER): Payer: Self-pay | Admitting: Vascular Surgery

## 2022-08-21 ENCOUNTER — Other Ambulatory Visit (INDEPENDENT_AMBULATORY_CARE_PROVIDER_SITE_OTHER): Payer: Self-pay

## 2022-08-21 DIAGNOSIS — S82041D Displaced comminuted fracture of right patella, subsequent encounter for closed fracture with routine healing: Secondary | ICD-10-CM | POA: Diagnosis not present

## 2022-08-21 MED ORDER — RIVAROXABAN 20 MG PO TABS: 20.0000 mg | ORAL_TABLET | Freq: Every day | ORAL | 3 refills | Status: DC

## 2022-08-22 ENCOUNTER — Other Ambulatory Visit: Payer: Self-pay | Admitting: Physician Assistant

## 2022-08-27 ENCOUNTER — Encounter: Payer: Self-pay | Admitting: Internal Medicine

## 2022-08-28 MED ORDER — EZETIMIBE 10 MG PO TABS: 10.0000 mg | ORAL_TABLET | Freq: Every day | ORAL | 3 refills | Status: DC

## 2022-09-12 ENCOUNTER — Encounter: Payer: Self-pay | Admitting: Internal Medicine

## 2022-09-12 ENCOUNTER — Ambulatory Visit (INDEPENDENT_AMBULATORY_CARE_PROVIDER_SITE_OTHER): Payer: Medicare Other | Admitting: Internal Medicine

## 2022-09-12 ENCOUNTER — Ambulatory Visit (INDEPENDENT_AMBULATORY_CARE_PROVIDER_SITE_OTHER): Payer: Medicare Other

## 2022-09-12 ENCOUNTER — Other Ambulatory Visit: Payer: Self-pay | Admitting: Internal Medicine

## 2022-09-12 VITALS — BP 140/120 | HR 88 | Temp 98.2°F | Ht 72.0 in | Wt 255.0 lb

## 2022-09-12 DIAGNOSIS — R0600 Dyspnea, unspecified: Secondary | ICD-10-CM

## 2022-09-12 DIAGNOSIS — M1711 Unilateral primary osteoarthritis, right knee: Secondary | ICD-10-CM

## 2022-09-12 DIAGNOSIS — E538 Deficiency of other specified B group vitamins: Secondary | ICD-10-CM

## 2022-09-12 DIAGNOSIS — M5442 Lumbago with sciatica, left side: Secondary | ICD-10-CM

## 2022-09-12 DIAGNOSIS — M5441 Lumbago with sciatica, right side: Secondary | ICD-10-CM

## 2022-09-12 DIAGNOSIS — R7303 Prediabetes: Secondary | ICD-10-CM

## 2022-09-12 DIAGNOSIS — R0602 Shortness of breath: Secondary | ICD-10-CM | POA: Diagnosis not present

## 2022-09-12 DIAGNOSIS — R5383 Other fatigue: Secondary | ICD-10-CM

## 2022-09-12 DIAGNOSIS — R972 Elevated prostate specific antigen [PSA]: Secondary | ICD-10-CM | POA: Diagnosis not present

## 2022-09-12 DIAGNOSIS — E559 Vitamin D deficiency, unspecified: Secondary | ICD-10-CM

## 2022-09-12 DIAGNOSIS — E782 Mixed hyperlipidemia: Secondary | ICD-10-CM | POA: Diagnosis not present

## 2022-09-12 DIAGNOSIS — R3129 Other microscopic hematuria: Secondary | ICD-10-CM

## 2022-09-12 LAB — CBC WITH DIFFERENTIAL/PLATELET
Basophils Absolute: 0 10*3/uL (ref 0.0–0.1)
Basophils Relative: 0.4 % (ref 0.0–3.0)
Eosinophils Absolute: 0.2 10*3/uL (ref 0.0–0.7)
Eosinophils Relative: 1.9 % (ref 0.0–5.0)
HCT: 36.9 % — ABNORMAL LOW (ref 39.0–52.0)
Hemoglobin: 12.5 g/dL — ABNORMAL LOW (ref 13.0–17.0)
Lymphocytes Relative: 24.8 % (ref 12.0–46.0)
Lymphs Abs: 2.4 10*3/uL (ref 0.7–4.0)
MCHC: 34 g/dL (ref 30.0–36.0)
MCV: 85.2 fl (ref 78.0–100.0)
Monocytes Absolute: 0.7 10*3/uL (ref 0.1–1.0)
Monocytes Relative: 7 % (ref 3.0–12.0)
Neutro Abs: 6.4 10*3/uL (ref 1.4–7.7)
Neutrophils Relative %: 65.9 % (ref 43.0–77.0)
Platelets: 219 10*3/uL (ref 150.0–400.0)
RBC: 4.33 Mil/uL (ref 4.22–5.81)
RDW: 15.1 % (ref 11.5–15.5)
WBC: 9.7 10*3/uL (ref 4.0–10.5)

## 2022-09-12 LAB — URINALYSIS, ROUTINE W REFLEX MICROSCOPIC
Bilirubin Urine: NEGATIVE
Ketones, ur: NEGATIVE
Leukocytes,Ua: NEGATIVE
Nitrite: NEGATIVE
Specific Gravity, Urine: 1.015 (ref 1.000–1.030)
Total Protein, Urine: NEGATIVE
Urine Glucose: NEGATIVE
Urobilinogen, UA: 1 (ref 0.0–1.0)
pH: 7 (ref 5.0–8.0)

## 2022-09-12 LAB — BASIC METABOLIC PANEL
BUN: 17 mg/dL (ref 6–23)
CO2: 27 mEq/L (ref 19–32)
Calcium: 9.2 mg/dL (ref 8.4–10.5)
Chloride: 107 mEq/L (ref 96–112)
Creatinine, Ser: 1.54 mg/dL — ABNORMAL HIGH (ref 0.40–1.50)
GFR: 42.23 mL/min — ABNORMAL LOW (ref >60.00)
Glucose, Bld: 115 mg/dL — ABNORMAL HIGH (ref 70–99)
Potassium: 4.2 mEq/L (ref 3.5–5.1)
Sodium: 142 mEq/L (ref 135–145)

## 2022-09-12 LAB — VITAMIN D 25 HYDROXY (VIT D DEFICIENCY, FRACTURES): VITD: 35.4 ng/mL (ref 30.00–100.00)

## 2022-09-12 LAB — LIPID PANEL
Cholesterol: 131 mg/dL (ref 0–200)
HDL: 56.5 mg/dL (ref >39.00)
LDL Cholesterol: 48 mg/dL (ref 0–99)
NonHDL: 74.61
Total CHOL/HDL Ratio: 2
Triglycerides: 134 mg/dL (ref 0.0–149.0)
VLDL: 26.8 mg/dL (ref 0.0–40.0)

## 2022-09-12 LAB — HEPATIC FUNCTION PANEL
ALT: 23 U/L (ref 0–53)
AST: 15 U/L (ref 0–37)
Albumin: 3.9 g/dL (ref 3.5–5.2)
Alkaline Phosphatase: 104 U/L (ref 39–117)
Bilirubin, Direct: 0.1 mg/dL (ref 0.0–0.3)
Total Bilirubin: 0.5 mg/dL (ref 0.2–1.2)
Total Protein: 6.3 g/dL (ref 6.0–8.3)

## 2022-09-12 LAB — BRAIN NATRIURETIC PEPTIDE: Pro B Natriuretic peptide (BNP): 210 pg/mL — ABNORMAL HIGH (ref 0.0–100.0)

## 2022-09-12 LAB — VITAMIN B12: Vitamin B-12: 840 pg/mL (ref 211–911)

## 2022-09-12 LAB — TSH: TSH: 2.02 u[IU]/mL (ref 0.35–5.50)

## 2022-09-12 LAB — HEMOGLOBIN A1C: Hgb A1c MFr Bld: 6.6 % — ABNORMAL HIGH (ref 4.6–6.5)

## 2022-09-12 LAB — PSA: PSA: 0.11 ng/mL (ref 0.10–4.00)

## 2022-09-12 MED ORDER — FUROSEMIDE 20 MG PO TABS: 20.0000 mg | ORAL_TABLET | Freq: Every day | ORAL | 3 refills | Status: DC

## 2022-09-12 MED ORDER — TRAMADOL HCL 50 MG PO TABS: 50.0000 mg | ORAL_TABLET | Freq: Four times a day (QID) | ORAL | 0 refills | Status: DC | PRN

## 2022-09-12 MED ORDER — ALBUTEROL SULFATE HFA 108 (90 BASE) MCG/ACT IN AERS: 2.0000 | INHALATION_SPRAY | Freq: Four times a day (QID) | RESPIRATORY_TRACT | 5 refills | Status: DC | PRN
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
## 2022-09-12 NOTE — Telephone Encounter (Signed)
Please advise if second echocardiogram is needed

## 2022-09-12 NOTE — Telephone Encounter (Signed)
Handicapped form done to cma  O/w see lab test results reply

## 2022-09-12 NOTE — Patient Instructions (Signed)
Please take all new medication as prescribed - the inhaler to try, and tramadol as needed for pain (and call for refills if needed)  Please continue all other medications as before, and refills have been done if requested.  Please have the pharmacy call with any other refills you may need.  Please continue your efforts at being more active, low cholesterol diet, and weight control.  You are otherwise up to date with prevention measures today.  Please keep your appointments with your specialists as you may have planned - cardiology  You will be contacted regarding the referral for: echocardiogram, Pulmonary Function tests, and MRI for the lower back  Please go to the XRAY Department in the first floor for the x-ray testing  You will be contacted by phone if any changes need to be made immediately.  Otherwise, you will receive a letter about your results with an explanation, but please check with MyChart first.  Please remember to sign up for MyChart if you have not done so, as this will be important to you in the future with finding out test results, communicating by private email, and scheduling acute appointments online when needed.  Please make an Appointment to return in 4 months, or sooner if needed

## 2022-09-14 ENCOUNTER — Encounter: Payer: Self-pay | Admitting: Internal Medicine

## 2022-09-14 DIAGNOSIS — M1711 Unilateral primary osteoarthritis, right knee: Secondary | ICD-10-CM | POA: Insufficient documentation

## 2022-09-14 NOTE — Assessment & Plan Note (Signed)
Last vitamin D Lab Results  Component Value Date   VD25OH 35.40 09/12/2022   Low, to start oral replacement

## 2022-09-14 NOTE — Assessment & Plan Note (Signed)
Lab Results  Component Value Date   HGBA1C 6.6 (H) 09/12/2022   Stable, pt to continue current medical treatment - diet, wt control

## 2022-09-14 NOTE — Assessment & Plan Note (Signed)
With worsening symptoms  - for MRI LS spine

## 2022-09-14 NOTE — Assessment & Plan Note (Addendum)
Etiology unclear, for empiric albuterol hfa prn, and CXR r/o increased volume but exam seems benign, also for lab today including cbc, and Echo, PFTs, consider cardiology referral

## 2022-09-14 NOTE — Assessment & Plan Note (Signed)
Lab Results  Component Value Date   PSA 0.11 09/12/2022   PSA 0.41 07/19/2021   PSA 1.38 01/10/2021   Stable,  to f/u any worsening symptoms or concerns

## 2022-09-14 NOTE — Assessment & Plan Note (Signed)
Lab Results  Component Value Date   LDLCALC 48 09/12/2022   Stable, pt to continue current statin crestor 40 mg qd, zetia 10 mg qd

## 2022-09-14 NOTE — Assessment & Plan Note (Signed)
With recent worsening, for tramadol prn, pt to f/u ortho as planned

## 2022-09-15 ENCOUNTER — Encounter: Payer: Self-pay | Admitting: Internal Medicine

## 2022-09-21 ENCOUNTER — Other Ambulatory Visit: Payer: Self-pay | Admitting: Internal Medicine

## 2022-09-21 ENCOUNTER — Encounter: Payer: Self-pay | Admitting: Internal Medicine

## 2022-09-23 ENCOUNTER — Encounter: Payer: Self-pay | Admitting: Internal Medicine

## 2022-09-23 ENCOUNTER — Ambulatory Visit: Payer: Medicare Other | Attending: Internal Medicine | Admitting: Internal Medicine

## 2022-09-23 ENCOUNTER — Other Ambulatory Visit: Payer: Self-pay

## 2022-09-23 VITALS — BP 126/58 | HR 83 | Ht 72.0 in | Wt 253.4 lb

## 2022-09-23 DIAGNOSIS — I25811 Atherosclerosis of native coronary artery of transplanted heart without angina pectoris: Secondary | ICD-10-CM | POA: Diagnosis not present

## 2022-09-23 DIAGNOSIS — Z01812 Encounter for preprocedural laboratory examination: Secondary | ICD-10-CM | POA: Insufficient documentation

## 2022-09-23 DIAGNOSIS — R0602 Shortness of breath: Secondary | ICD-10-CM | POA: Insufficient documentation

## 2022-09-23 DIAGNOSIS — R0609 Other forms of dyspnea: Secondary | ICD-10-CM | POA: Insufficient documentation

## 2022-09-23 MED ORDER — METOPROLOL TARTRATE 50 MG PO TABS: ORAL_TABLET | ORAL | 0 refills | Status: DC

## 2022-09-23 MED ORDER — ALFUZOSIN HCL ER 10 MG PO TB24
10.0000 mg | ORAL_TABLET | Freq: Every day | ORAL | 3 refills | Status: AC
  Filled 2022-09-23: qty 90, 90d supply, fill #0
  Filled 2023-01-19: qty 90, 90d supply, fill #1
  Filled 2023-05-30: qty 90, 90d supply, fill #2
  Filled 2023-08-26: qty 90, 90d supply, fill #3

## 2022-09-23 NOTE — Patient Instructions (Signed)
Medication Instructions:  Your physician recommends that you continue on your current medications as directed. Please refer to the Current Medication list given to you today.  *If you need a refill on your cardiac medications before your next appointment, please call your pharmacy*   Lab Work: Your physician recommends that you return for lab work if CT scan scheduled after May 18th:  BMET   If you have labs (blood work) drawn today and your tests are completely normal, you will receive your results only by: MyChart Message (if you have MyChart) OR A paper copy in the mail If you have any lab test that is abnormal or we need to change your treatment, we will call you to review the results.   Testing/Procedures:   Your cardiac CT will be scheduled at one of the below locations:   [facility] [address] [place], [place] [address] [phone]  If scheduled at [facility], please arrive at the [facility] Entrance (Entrance C2) of [facility] 30 minutes prior to test start time. You can use the FREE valet parking offered at entrance C (encouraged to control the heart rate for the test)  Proceed to the [facility] Radiology Department (first floor) to check-in and test prep.  All radiology patients and guests should use entrance C2 at [facility], accessed from [facility], even though the hospital's physical address listed is [address]       Please follow these instructions carefully (unless otherwise directed):  Hold all erectile dysfunction medications at least 3 days (72 hrs) prior to test. (Ie viagra, cialis, sildenafil, tadalafil, etc) We will administer nitroglycerin during this exam.   On the Night Before the Test: Be sure to Drink plenty of water. Do not consume any caffeinated/decaffeinated beverages or chocolate 12 hours prior to your test. Do not take any antihistamines 12 hours prior to  your test.  On the Day of the Test: Drink plenty of water until 1 hour prior to the test. Do not eat any food 1 hour prior to test. You may take your regular medications prior to the test.  Take metoprolol (Lopressor) two hours prior to test. If you take Furosemide/Hydrochlorothiazide/Spironolactone, please HOLD on the morning of the test.   After the Test: Drink plenty of water. After receiving IV contrast, you may experience a mild flushed feeling. This is normal. On occasion, you may experience a mild rash up to 24 hours after the test. This is not dangerous. If this occurs, you can take Benadryl 25 mg and increase your fluid intake. If you experience trouble breathing, this can be serious. If it is severe call 911 IMMEDIATELY. If it is mild, please call our office. If you take any of these medications: Glipizide/Metformin, Avandament, Glucavance, please do not take 48 hours after completing test unless otherwise instructed.  We will call to schedule your test 2-4 weeks out understanding that some insurance companies will need an authorization prior to the service being performed.   For non-scheduling related questions, please contact the cardiac imaging nurse navigator should you have any questions/concerns: Rockwell Alexandria, Cardiac Imaging Nurse Navigator Larey Brick, Cardiac Imaging Nurse Navigator [facility] Direct Office Dial: [phone]   For scheduling needs, including cancellations and rescheduling, please call [place], [phone].    Follow-Up: At [facility], you and your health needs are our priority.  As part of our continuing mission to provide you  with exceptional heart care, we have created designated Provider Care Teams.  These Care Teams include your primary Cardiologist (physician) and Advanced Practice Providers (APPs -  Physician Assistants and Nurse Practitioners) who all work together to provide you with the care you  need, when you need it.    Your next appointment:   6 month(s)  Provider:   Maisie Fus, MD
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
## 2022-09-24 ENCOUNTER — Other Ambulatory Visit: Payer: Self-pay

## 2022-09-24 ENCOUNTER — Other Ambulatory Visit (HOSPITAL_COMMUNITY): Payer: Self-pay

## 2022-09-24 ENCOUNTER — Encounter: Payer: Self-pay | Admitting: Pharmacist

## 2022-09-26 ENCOUNTER — Telehealth (HOSPITAL_COMMUNITY): Payer: Self-pay | Admitting: *Deleted

## 2022-09-26 NOTE — Telephone Encounter (Signed)
Attempted to call patient regarding upcoming cardiac CT appointment. °Left message on voicemail with name and callback number ° °Rubi Tooley RN Navigator Cardiac Imaging °[facility] °[phone] Office °[phone] Cell ° °

## 2022-09-26 NOTE — Telephone Encounter (Signed)
Patient returning call about his upcoming cardiac imaging study; pt verbalizes understanding of appt date/time, parking situation and where to check in, pre-test NPO status and medications ordered, and verified current allergies; name and call back number provided for further questions should they arise  Larey Brick RN Navigator Cardiac Imaging [facility] [phone] office [phone] cell  Patient to take 50mg  metoprolol tartrate two hours prior to his cardiac CT scan.  He is aware to arrive at 2:30pm.

## 2022-09-27 ENCOUNTER — Ambulatory Visit (HOSPITAL_COMMUNITY)
Admission: RE | Admit: 2022-09-27 | Discharge: 2022-09-27 | Disposition: A | Discharge status: Home / Self Care | Payer: Medicare Other | Source: Ambulatory Visit | Attending: Internal Medicine | Admitting: Internal Medicine

## 2022-09-27 DIAGNOSIS — I25811 Atherosclerosis of native coronary artery of transplanted heart without angina pectoris: Secondary | ICD-10-CM | POA: Insufficient documentation

## 2022-09-27 DIAGNOSIS — I2510 Atherosclerotic heart disease of native coronary artery without angina pectoris: Secondary | ICD-10-CM | POA: Diagnosis not present

## 2022-09-27 DIAGNOSIS — R0602 Shortness of breath: Secondary | ICD-10-CM | POA: Insufficient documentation

## 2022-09-27 DIAGNOSIS — R0609 Other forms of dyspnea: Secondary | ICD-10-CM | POA: Diagnosis not present

## 2022-09-27 MED ORDER — METOPROLOL TARTRATE 5 MG/5ML IV SOLN
5.0000 mg | Freq: Once | INTRAVENOUS | Status: AC
  Administered 2022-09-27: 5 mg via INTRAVENOUS

## 2022-09-27 MED ORDER — IOHEXOL 350 MG/ML SOLN
95.0000 mL | Freq: Once | INTRAVENOUS | Status: AC | PRN
  Administered 2022-09-27: 95 mL via INTRAVENOUS

## 2022-09-27 MED ORDER — NITROGLYCERIN 0.4 MG SL SUBL
SUBLINGUAL_TABLET | SUBLINGUAL | Status: AC
  Filled 2022-09-27: qty 2

## 2022-09-27 MED ORDER — NITROGLYCERIN 0.4 MG SL SUBL
0.8000 mg | SUBLINGUAL_TABLET | Freq: Once | SUBLINGUAL | Status: AC
  Administered 2022-09-27: 0.8 mg via SUBLINGUAL

## 2022-09-27 MED ORDER — METOPROLOL TARTRATE 5 MG/5ML IV SOLN
INTRAVENOUS | Status: AC
  Filled 2022-09-27: qty 5

## 2022-09-27 NOTE — Progress Notes (Signed)
Patient tolerated CT well.  Vital signs stable encourage to drink water throughout day.Reasons explained and verbalized understanding.   

## 2022-09-30 ENCOUNTER — Other Ambulatory Visit: Payer: Self-pay | Admitting: Internal Medicine

## 2022-09-30 DIAGNOSIS — I25811 Atherosclerosis of native coronary artery of transplanted heart without angina pectoris: Secondary | ICD-10-CM

## 2022-09-30 MED ORDER — NITROGLYCERIN 0.4 MG SL SUBL
0.4000 mg | SUBLINGUAL_TABLET | SUBLINGUAL | 3 refills | Status: AC | PRN
Start: 2022-09-30 — End: 2025-06-01

## 2022-09-30 MED ORDER — METOPROLOL SUCCINATE ER 50 MG PO TB24: 50.0000 mg | ORAL_TABLET | Freq: Every day | ORAL | 3 refills | Status: DC

## 2022-10-01 ENCOUNTER — Other Ambulatory Visit: Payer: Self-pay | Admitting: *Deleted

## 2022-10-01 ENCOUNTER — Telehealth: Payer: Self-pay | Admitting: *Deleted

## 2022-10-01 DIAGNOSIS — Z01812 Encounter for preprocedural laboratory examination: Secondary | ICD-10-CM

## 2022-10-01 DIAGNOSIS — I25811 Atherosclerosis of native coronary artery of transplanted heart without angina pectoris: Secondary | ICD-10-CM | POA: Diagnosis not present

## 2022-10-01 DIAGNOSIS — R0602 Shortness of breath: Secondary | ICD-10-CM | POA: Diagnosis not present

## 2022-10-01 NOTE — Telephone Encounter (Signed)
Left Heart Cath 10/04/22, patient with history of CKD, call placed to patient to make arrangements to update pre-cath BMP/CBC. Patient will plan to go to [facility] lab this afternoon before 4 PM and have BMP/CBC done.

## 2022-10-02 LAB — CBC
Hematocrit: 34.7 % — ABNORMAL LOW (ref 37.5–51.0)
Hemoglobin: 12.3 g/dL — ABNORMAL LOW (ref 13.0–17.7)
MCH: 31.1 pg (ref 26.6–33.0)
MCHC: 35.4 g/dL (ref 31.5–35.7)
MCV: 88 fL (ref 79–97)
Platelets: 211 10*3/uL (ref 150–450)
RBC: 3.95 x10E6/uL — ABNORMAL LOW (ref 4.14–5.80)
RDW: 14.2 % (ref 11.6–15.4)
WBC: 7.8 10*3/uL (ref 3.4–10.8)

## 2022-10-02 LAB — BASIC METABOLIC PANEL
BUN/Creatinine Ratio: 11 (ref 10–24)
BUN: 18 mg/dL (ref 8–27)
CO2: 21 mmol/L (ref 20–29)
Calcium: 9.3 mg/dL (ref 8.6–10.2)
Chloride: 104 mmol/L (ref 96–106)
Creatinine, Ser: 1.62 mg/dL — ABNORMAL HIGH (ref 0.76–1.27)
Glucose: 106 mg/dL — ABNORMAL HIGH (ref 70–99)
Potassium: 4.9 mmol/L (ref 3.5–5.2)
Sodium: 144 mmol/L (ref 134–144)
eGFR: 43 mL/min/{1.73_m2} — ABNORMAL LOW (ref >59)

## 2022-10-02 NOTE — Telephone Encounter (Signed)
Cardiac Catheterization scheduled at [facility] for: Friday Oct 04, 2022 10:30 AM Arrival time [facility] Main Entrance A at: 5:30 AM-pre-procedure hydration-per protocol GFR 43  Nothing to eat after midnight prior to procedure, clear liquids until 5 AM day of procedure.  Medication instructions: -Hold:  Lasix-day before and day of procedure-per protocol GFR 43 -Other usual morning medications can be taken with sips of water including aspirin 81 mg.  Confirmed patient has responsible adult to drive home post procedure and be with patient first 24 hours after arriving home.  Plan to go home the same day, you will only stay overnight if medically necessary.  Reviewed procedure instructions, pre-procedure hydration with patient.

## 2022-10-04 ENCOUNTER — Other Ambulatory Visit: Payer: Self-pay

## 2022-10-04 ENCOUNTER — Encounter (HOSPITAL_COMMUNITY): Payer: Self-pay | Admitting: Cardiology

## 2022-10-04 ENCOUNTER — Encounter (HOSPITAL_COMMUNITY)
Admission: RE | Disposition: A | Discharge status: Home / Self Care | Payer: Self-pay | Source: Home / Self Care | Attending: Cardiology

## 2022-10-04 ENCOUNTER — Ambulatory Visit (HOSPITAL_COMMUNITY)
Admission: RE | Admit: 2022-10-04 | Discharge: 2022-10-04 | Disposition: A | Discharge status: Home / Self Care | Payer: Medicare Other | Attending: Cardiology | Admitting: Cardiology

## 2022-10-04 DIAGNOSIS — R0600 Dyspnea, unspecified: Secondary | ICD-10-CM | POA: Diagnosis not present

## 2022-10-04 DIAGNOSIS — E785 Hyperlipidemia, unspecified: Secondary | ICD-10-CM | POA: Diagnosis not present

## 2022-10-04 DIAGNOSIS — I2510 Atherosclerotic heart disease of native coronary artery without angina pectoris: Secondary | ICD-10-CM | POA: Diagnosis not present

## 2022-10-04 DIAGNOSIS — Z7982 Long term (current) use of aspirin: Secondary | ICD-10-CM | POA: Diagnosis not present

## 2022-10-04 DIAGNOSIS — I739 Peripheral vascular disease, unspecified: Secondary | ICD-10-CM | POA: Insufficient documentation

## 2022-10-04 DIAGNOSIS — Z7901 Long term (current) use of anticoagulants: Secondary | ICD-10-CM | POA: Diagnosis not present

## 2022-10-04 DIAGNOSIS — Z7902 Long term (current) use of antithrombotics/antiplatelets: Secondary | ICD-10-CM | POA: Diagnosis not present

## 2022-10-04 DIAGNOSIS — I10 Essential (primary) hypertension: Secondary | ICD-10-CM | POA: Diagnosis not present

## 2022-10-04 DIAGNOSIS — Z79899 Other long term (current) drug therapy: Secondary | ICD-10-CM | POA: Insufficient documentation

## 2022-10-04 DIAGNOSIS — Z86711 Personal history of pulmonary embolism: Secondary | ICD-10-CM | POA: Insufficient documentation

## 2022-10-04 DIAGNOSIS — Z87891 Personal history of nicotine dependence: Secondary | ICD-10-CM | POA: Diagnosis not present

## 2022-10-04 HISTORY — PX: CORONARY PRESSURE/FFR STUDY: CATH118243

## 2022-10-04 HISTORY — PX: LEFT HEART CATH AND CORONARY ANGIOGRAPHY: CATH118249

## 2022-10-04 LAB — POCT ACTIVATED CLOTTING TIME: Activated Clotting Time: 304 seconds

## 2022-10-04 SURGERY — LEFT HEART CATH AND CORONARY ANGIOGRAPHY
Anesthesia: LOCAL

## 2022-10-04 MED ORDER — SODIUM CHLORIDE 0.9 % WEIGHT BASED INFUSION
1.0000 mL/kg/h | INTRAVENOUS | Status: DC
  Administered 2022-10-04: 1 mL/kg/h via INTRAVENOUS

## 2022-10-04 MED ORDER — SODIUM CHLORIDE 0.9% FLUSH: 3.0000 mL | Freq: Two times a day (BID) | INTRAVENOUS | Status: DC

## 2022-10-04 MED ORDER — IOHEXOL 350 MG/ML SOLN
INTRAVENOUS | Status: DC | PRN
  Administered 2022-10-04: 85 mL

## 2022-10-04 MED ORDER — MIDAZOLAM HCL 2 MG/2ML IJ SOLN
INTRAMUSCULAR | Status: AC
  Filled 2022-10-04: qty 2

## 2022-10-04 MED ORDER — SODIUM CHLORIDE 0.9 % IV SOLN: 250.0000 mL | INTRAVENOUS | Status: DC | PRN

## 2022-10-04 MED ORDER — LABETALOL HCL 5 MG/ML IV SOLN: 10.0000 mg | INTRAVENOUS | Status: DC | PRN

## 2022-10-04 MED ORDER — LABETALOL HCL 5 MG/ML IV SOLN
INTRAVENOUS | Status: DC | PRN
  Administered 2022-10-04: 10 mg via INTRAVENOUS

## 2022-10-04 MED ORDER — HEPARIN (PORCINE) IN NACL 1000-0.9 UT/500ML-% IV SOLN
INTRAVENOUS | Status: DC | PRN
  Administered 2022-10-04 (×2): 500 mL

## 2022-10-04 MED ORDER — SODIUM CHLORIDE 0.9 % WEIGHT BASED INFUSION
3.0000 mL/kg/h | INTRAVENOUS | Status: AC
  Administered 2022-10-04: 3 mL/kg/h via INTRAVENOUS

## 2022-10-04 MED ORDER — SODIUM CHLORIDE 0.9% FLUSH: 3.0000 mL | INTRAVENOUS | Status: DC | PRN

## 2022-10-04 MED ORDER — VERAPAMIL HCL 2.5 MG/ML IV SOLN
INTRAVENOUS | Status: AC
  Filled 2022-10-04: qty 2

## 2022-10-04 MED ORDER — HEPARIN SODIUM (PORCINE) 1000 UNIT/ML IJ SOLN
INTRAMUSCULAR | Status: AC
  Filled 2022-10-04: qty 10

## 2022-10-04 MED ORDER — ACETAMINOPHEN 325 MG PO TABS: 650.0000 mg | ORAL_TABLET | ORAL | Status: DC | PRN

## 2022-10-04 MED ORDER — VERAPAMIL HCL 2.5 MG/ML IV SOLN
INTRAVENOUS | Status: DC | PRN
  Administered 2022-10-04: 10 mL via INTRA_ARTERIAL

## 2022-10-04 MED ORDER — SODIUM CHLORIDE 0.9 % IV SOLN: INTRAVENOUS | Status: DC

## 2022-10-04 MED ORDER — LIDOCAINE HCL (PF) 1 % IJ SOLN
INTRAMUSCULAR | Status: DC | PRN
  Administered 2022-10-04: 2 mL

## 2022-10-04 MED ORDER — HYDRALAZINE HCL 20 MG/ML IJ SOLN: 10.0000 mg | INTRAMUSCULAR | Status: DC | PRN

## 2022-10-04 MED ORDER — HEPARIN SODIUM (PORCINE) 1000 UNIT/ML IJ SOLN
INTRAMUSCULAR | Status: DC | PRN
  Administered 2022-10-04: 6500 [IU] via INTRAVENOUS
  Administered 2022-10-04: 6000 [IU] via INTRAVENOUS

## 2022-10-04 MED ORDER — MIDAZOLAM HCL 2 MG/2ML IJ SOLN
INTRAMUSCULAR | Status: DC | PRN
  Administered 2022-10-04: 1 mg via INTRAVENOUS

## 2022-10-04 MED ORDER — VALSARTAN 40 MG PO TABS: 40.0000 mg | ORAL_TABLET | Freq: Every day | ORAL | 11 refills | Status: DC

## 2022-10-04 MED ORDER — METOPROLOL TARTRATE 50 MG PO TABS
50.0000 mg | ORAL_TABLET | Freq: Once | ORAL | Status: AC
  Administered 2022-10-04: 50 mg via ORAL
  Filled 2022-10-04: qty 1

## 2022-10-04 MED ORDER — ASPIRIN 81 MG PO CHEW: 81.0000 mg | CHEWABLE_TABLET | ORAL | Status: AC

## 2022-10-04 MED ORDER — LABETALOL HCL 5 MG/ML IV SOLN
INTRAVENOUS | Status: AC
  Filled 2022-10-04: qty 4

## 2022-10-04 MED ORDER — LIDOCAINE HCL (PF) 1 % IJ SOLN
INTRAMUSCULAR | Status: AC
  Filled 2022-10-04: qty 30

## 2022-10-04 MED ORDER — FENTANYL CITRATE (PF) 100 MCG/2ML IJ SOLN
INTRAMUSCULAR | Status: DC | PRN
  Administered 2022-10-04: 25 ug via INTRAVENOUS

## 2022-10-04 MED ORDER — FENTANYL CITRATE (PF) 100 MCG/2ML IJ SOLN
INTRAMUSCULAR | Status: AC
  Filled 2022-10-04: qty 2

## 2022-10-04 MED ORDER — ONDANSETRON HCL 4 MG/2ML IJ SOLN: 4.0000 mg | Freq: Four times a day (QID) | INTRAMUSCULAR | Status: DC | PRN

## 2022-10-04 SURGICAL SUPPLY — 13 items
CATH OPTITORQUE TIG 4.0 5F (CATHETERS) IMPLANT
CATH VISTA GUIDE 6FR XBLAD3.5 (CATHETERS) IMPLANT
DEVICE RAD COMP TR BAND LRG (VASCULAR PRODUCTS) IMPLANT
GLIDESHEATH SLEND SS 6F .021 (SHEATH) IMPLANT
GUIDEWIRE INQWIRE 1.5J.035X260 (WIRE) IMPLANT
GUIDEWIRE PRESSURE X 175 (WIRE) IMPLANT
INQWIRE 1.5J .035X260CM (WIRE) ×1
KIT ESSENTIALS PG (KITS) IMPLANT
KIT HEART LEFT (KITS) ×1 IMPLANT
PACK CARDIAC CATHETERIZATION (CUSTOM PROCEDURE TRAY) ×1 IMPLANT
SHEATH PROBE COVER 6X72 (BAG) IMPLANT
TRANSDUCER W/STOPCOCK (MISCELLANEOUS) ×1 IMPLANT
TUBING CIL FLEX 10 FLL-RA (TUBING) ×1 IMPLANT

## 2022-10-04 NOTE — Interval H&P Note (Signed)
History and Physical Interval Note:  10/04/2022 10:47 AM  Alex Kemp  has presented today for surgery, with the diagnosis of abnormal cta - dyspnea.  The various methods of treatment have been discussed with the patient and family. After consideration of risks, benefits and other options for treatment, the patient has consented to  Procedure(s): LEFT HEART CATH AND CORONARY ANGIOGRAPHY (N/A)  PERCUTANEOUS CORONARY INTERVENTION  as a surgical intervention.  The patient's history has been reviewed, patient examined, no change in status, stable for surgery.  I have reviewed the patient's chart and labs.  Questions were answered to the patient's satisfaction.    Cath Lab Visit (complete for each Cath Lab visit)  Clinical Evaluation Leading to the Procedure:   ACS: No.  Non-ACS:    Anginal Classification: CCS III -> with major symptom being exertional dyspnea  Anti-ischemic medical therapy: Minimal Therapy (1 class of medications)  Non-Invasive Test Results: High-risk stress test findings: cardiac mortality >3%/year  Prior CABG: No previous CABG    Bryan Lemma

## 2022-10-04 NOTE — Progress Notes (Signed)
TR BAND REMOVAL  LOCATION:    right radial  DEFLATED PER PROTOCOL:    Yes.    TIME BAND OFF / DRESSING APPLIED: 10/04/22 at 1410   SITE UPON ARRIVAL:    Level 0  SITE AFTER BAND REMOVAL:    Level 0  CIRCULATION SENSATION AND MOVEMENT:    Within Normal Limits   Yes.    COMMENTS:

## 2022-10-06 ENCOUNTER — Ambulatory Visit
Admission: RE | Admit: 2022-10-06 | Discharge: 2022-10-06 | Disposition: A | Discharge status: Home / Self Care | Payer: Medicare Other | Source: Ambulatory Visit | Attending: Internal Medicine | Admitting: Internal Medicine

## 2022-10-06 DIAGNOSIS — M5430 Sciatica, unspecified side: Secondary | ICD-10-CM | POA: Diagnosis not present

## 2022-10-06 DIAGNOSIS — M5441 Lumbago with sciatica, right side: Secondary | ICD-10-CM

## 2022-10-09 ENCOUNTER — Other Ambulatory Visit (HOSPITAL_COMMUNITY): Payer: Medicare Other

## 2022-10-17 ENCOUNTER — Other Ambulatory Visit: Payer: Self-pay | Admitting: Internal Medicine

## 2022-10-22 ENCOUNTER — Encounter: Payer: Self-pay | Admitting: Internal Medicine

## 2022-10-22 DIAGNOSIS — M5441 Lumbago with sciatica, right side: Secondary | ICD-10-CM

## 2022-10-23 DIAGNOSIS — N1832 Chronic kidney disease, stage 3b: Secondary | ICD-10-CM | POA: Diagnosis not present

## 2022-10-23 DIAGNOSIS — D631 Anemia in chronic kidney disease: Secondary | ICD-10-CM | POA: Diagnosis not present

## 2022-10-23 DIAGNOSIS — Z86711 Personal history of pulmonary embolism: Secondary | ICD-10-CM | POA: Diagnosis not present

## 2022-10-23 DIAGNOSIS — I129 Hypertensive chronic kidney disease with stage 1 through stage 4 chronic kidney disease, or unspecified chronic kidney disease: Secondary | ICD-10-CM | POA: Diagnosis not present

## 2022-10-23 DIAGNOSIS — I2510 Atherosclerotic heart disease of native coronary artery without angina pectoris: Secondary | ICD-10-CM | POA: Diagnosis not present

## 2022-10-24 LAB — LAB REPORT - SCANNED: EGFR: 46

## 2022-10-31 DIAGNOSIS — R3121 Asymptomatic microscopic hematuria: Secondary | ICD-10-CM | POA: Diagnosis not present

## 2022-11-13 ENCOUNTER — Encounter: Payer: Self-pay | Admitting: Internal Medicine

## 2022-11-13 DIAGNOSIS — M5441 Lumbago with sciatica, right side: Secondary | ICD-10-CM

## 2022-11-20 ENCOUNTER — Other Ambulatory Visit: Payer: Self-pay | Admitting: Internal Medicine

## 2022-11-20 DIAGNOSIS — N200 Calculus of kidney: Secondary | ICD-10-CM | POA: Diagnosis not present

## 2022-11-20 DIAGNOSIS — N289 Disorder of kidney and ureter, unspecified: Secondary | ICD-10-CM | POA: Diagnosis not present

## 2022-11-20 DIAGNOSIS — N400 Enlarged prostate without lower urinary tract symptoms: Secondary | ICD-10-CM | POA: Diagnosis not present

## 2022-11-20 DIAGNOSIS — R3129 Other microscopic hematuria: Secondary | ICD-10-CM | POA: Diagnosis not present

## 2022-11-20 DIAGNOSIS — R3121 Asymptomatic microscopic hematuria: Secondary | ICD-10-CM | POA: Diagnosis not present

## 2022-12-16 ENCOUNTER — Other Ambulatory Visit: Payer: Self-pay | Admitting: Internal Medicine

## 2022-12-22 ENCOUNTER — Encounter: Payer: Self-pay | Admitting: Internal Medicine

## 2022-12-23 ENCOUNTER — Other Ambulatory Visit (HOSPITAL_COMMUNITY): Payer: Self-pay

## 2022-12-23 ENCOUNTER — Other Ambulatory Visit: Payer: Self-pay | Admitting: *Deleted

## 2022-12-23 MED ORDER — VALSARTAN 40 MG PO TABS: 40.0000 mg | ORAL_TABLET | Freq: Every day | ORAL | 0 refills | Status: DC

## 2022-12-25 ENCOUNTER — Encounter (INDEPENDENT_AMBULATORY_CARE_PROVIDER_SITE_OTHER): Payer: Self-pay

## 2022-12-26 ENCOUNTER — Other Ambulatory Visit: Payer: Self-pay | Admitting: Oncology

## 2022-12-26 DIAGNOSIS — Z006 Encounter for examination for normal comparison and control in clinical research program: Secondary | ICD-10-CM

## 2022-12-30 DIAGNOSIS — N281 Cyst of kidney, acquired: Secondary | ICD-10-CM | POA: Diagnosis not present

## 2022-12-30 DIAGNOSIS — R3121 Asymptomatic microscopic hematuria: Secondary | ICD-10-CM | POA: Diagnosis not present

## 2022-12-30 DIAGNOSIS — N400 Enlarged prostate without lower urinary tract symptoms: Secondary | ICD-10-CM | POA: Diagnosis not present

## 2023-01-08 DIAGNOSIS — Z6833 Body mass index (BMI) 33.0-33.9, adult: Secondary | ICD-10-CM | POA: Diagnosis not present

## 2023-01-08 DIAGNOSIS — M48062 Spinal stenosis, lumbar region with neurogenic claudication: Secondary | ICD-10-CM | POA: Diagnosis not present

## 2023-01-13 DIAGNOSIS — H2513 Age-related nuclear cataract, bilateral: Secondary | ICD-10-CM | POA: Diagnosis not present

## 2023-01-13 DIAGNOSIS — H25013 Cortical age-related cataract, bilateral: Secondary | ICD-10-CM | POA: Diagnosis not present

## 2023-01-14 ENCOUNTER — Ambulatory Visit: Payer: Medicare Other | Admitting: Internal Medicine

## 2023-01-14 ENCOUNTER — Encounter: Payer: Self-pay | Admitting: Internal Medicine

## 2023-01-14 VITALS — BP 126/80 | HR 108 | Temp 98.2°F | Ht 72.0 in | Wt 250.0 lb

## 2023-01-14 DIAGNOSIS — I10 Essential (primary) hypertension: Secondary | ICD-10-CM | POA: Diagnosis not present

## 2023-01-14 DIAGNOSIS — E782 Mixed hyperlipidemia: Secondary | ICD-10-CM | POA: Diagnosis not present

## 2023-01-14 DIAGNOSIS — R7303 Prediabetes: Secondary | ICD-10-CM | POA: Diagnosis not present

## 2023-01-14 DIAGNOSIS — E559 Vitamin D deficiency, unspecified: Secondary | ICD-10-CM | POA: Diagnosis not present

## 2023-01-14 DIAGNOSIS — N1831 Chronic kidney disease, stage 3a: Secondary | ICD-10-CM

## 2023-01-14 DIAGNOSIS — R0600 Dyspnea, unspecified: Secondary | ICD-10-CM | POA: Diagnosis not present

## 2023-01-14 LAB — POCT GLYCOSYLATED HEMOGLOBIN (HGB A1C): Hemoglobin A1C: 6.6 % — AB (ref 4.0–5.6)

## 2023-01-14 MED ORDER — TRELEGY ELLIPTA 100-62.5-25 MCG/ACT IN AEPB: 1.0000 | INHALATION_SPRAY | Freq: Every day | RESPIRATORY_TRACT | 11 refills | Status: DC

## 2023-01-14 NOTE — Patient Instructions (Signed)
Please take all new medication as prescribed- the trelegy trial for the breathing  Your A1c was done today  Please continue all other medications as before, and refills have been done if requested.  Please have the pharmacy call with any other refills you may need.  Please continue your efforts at being more active, low cholesterol diet, and weight control.  Please keep your appointments with your specialists as you may have planned - Cardiology, Dr Danielle Dess, Renal and Dr Molli Barrows  Please make an Appointment to return in 6 months, or sooner if needed, also with Lab Appointment for testing done 3-5 days before at the FIRST FLOOR Lab (so this is for TWO appointments - please see the scheduling desk as you leave)
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
## 2023-01-17 ENCOUNTER — Encounter: Payer: Self-pay | Admitting: Internal Medicine

## 2023-01-17 NOTE — Assessment & Plan Note (Signed)
Last vitamin D Lab Results  Component Value Date   VD25OH 35.40 09/12/2022   Low, to start oral replacement

## 2023-01-17 NOTE — Assessment & Plan Note (Signed)
Likely underlying copd - for trial trelegy, f/u PFTs

## 2023-01-17 NOTE — Assessment & Plan Note (Signed)
Lab Results  Component Value Date   CREATININE 1.62 (H) 10/01/2022   Stable overall, cont to avoid nephrotoxins

## 2023-01-17 NOTE — Assessment & Plan Note (Signed)
BP Readings from Last 3 Encounters:  01/14/23 126/80  10/04/22 (!) 160/65  09/27/22 (!) 141/64   Stable, pt to continue medical treatment diovan 40 every day, toprol xl 50 qd

## 2023-01-17 NOTE — Assessment & Plan Note (Signed)
Lab Results  Component Value Date   HGBA1C 6.6 (A) 01/14/2023   Stable, pt to continue current medical treatment  - diet, wt control

## 2023-01-17 NOTE — Assessment & Plan Note (Signed)
Lab Results  Component Value Date   LDLCALC 48 09/12/2022   Stable, pt to continue current statin crestor 40 every day, zetia 10 qd

## 2023-01-19 ENCOUNTER — Other Ambulatory Visit: Payer: Self-pay | Admitting: Cardiology

## 2023-01-20 ENCOUNTER — Other Ambulatory Visit (HOSPITAL_COMMUNITY): Payer: Self-pay

## 2023-01-20 MED ORDER — VALSARTAN 40 MG PO TABS
40.0000 mg | ORAL_TABLET | Freq: Every day | ORAL | 3 refills | Status: DC
  Filled 2023-01-20 – 2023-05-30 (×2): qty 90, 90d supply, fill #0

## 2023-01-21 ENCOUNTER — Encounter: Payer: Self-pay | Admitting: Internal Medicine

## 2023-01-22 ENCOUNTER — Other Ambulatory Visit: Payer: Self-pay | Admitting: Internal Medicine

## 2023-01-22 ENCOUNTER — Other Ambulatory Visit: Payer: Self-pay

## 2023-01-22 DIAGNOSIS — Z23 Encounter for immunization: Secondary | ICD-10-CM | POA: Diagnosis not present

## 2023-01-24 ENCOUNTER — Encounter: Payer: Self-pay | Admitting: Internal Medicine

## 2023-01-28 MED ORDER — PREDNISONE 10 MG PO TABS: ORAL_TABLET | ORAL | 0 refills | Status: DC

## 2023-02-06 ENCOUNTER — Encounter: Payer: Self-pay | Admitting: Internal Medicine

## 2023-02-07 ENCOUNTER — Other Ambulatory Visit: Payer: Self-pay

## 2023-02-07 MED ORDER — TRELEGY ELLIPTA 100-62.5-25 MCG/ACT IN AEPB: 1.0000 | INHALATION_SPRAY | Freq: Every day | RESPIRATORY_TRACT | 11 refills | Status: DC

## 2023-02-20 ENCOUNTER — Encounter: Payer: Self-pay | Admitting: Internal Medicine

## 2023-02-20 DIAGNOSIS — I129 Hypertensive chronic kidney disease with stage 1 through stage 4 chronic kidney disease, or unspecified chronic kidney disease: Secondary | ICD-10-CM | POA: Diagnosis not present

## 2023-02-20 DIAGNOSIS — N1832 Chronic kidney disease, stage 3b: Secondary | ICD-10-CM | POA: Diagnosis not present

## 2023-02-20 DIAGNOSIS — Z86711 Personal history of pulmonary embolism: Secondary | ICD-10-CM | POA: Diagnosis not present

## 2023-02-20 DIAGNOSIS — D631 Anemia in chronic kidney disease: Secondary | ICD-10-CM | POA: Diagnosis not present

## 2023-02-20 DIAGNOSIS — I2510 Atherosclerotic heart disease of native coronary artery without angina pectoris: Secondary | ICD-10-CM | POA: Diagnosis not present

## 2023-02-20 DIAGNOSIS — N2581 Secondary hyperparathyroidism of renal origin: Secondary | ICD-10-CM | POA: Diagnosis not present

## 2023-02-20 DIAGNOSIS — N189 Chronic kidney disease, unspecified: Secondary | ICD-10-CM | POA: Diagnosis not present

## 2023-02-21 LAB — LAB REPORT - SCANNED
Creatinine, POC: 77 mg/dL
EGFR: 40

## 2023-02-26 ENCOUNTER — Other Ambulatory Visit: Payer: Self-pay | Admitting: Internal Medicine

## 2023-03-03 DIAGNOSIS — R3912 Poor urinary stream: Secondary | ICD-10-CM | POA: Diagnosis not present

## 2023-03-03 DIAGNOSIS — N401 Enlarged prostate with lower urinary tract symptoms: Secondary | ICD-10-CM | POA: Diagnosis not present

## 2023-03-03 DIAGNOSIS — N200 Calculus of kidney: Secondary | ICD-10-CM | POA: Diagnosis not present

## 2023-03-06 DIAGNOSIS — M5416 Radiculopathy, lumbar region: Secondary | ICD-10-CM | POA: Diagnosis not present

## 2023-03-06 DIAGNOSIS — M5116 Intervertebral disc disorders with radiculopathy, lumbar region: Secondary | ICD-10-CM | POA: Diagnosis not present

## 2023-03-07 ENCOUNTER — Other Ambulatory Visit: Payer: Self-pay | Admitting: Internal Medicine

## 2023-03-07 ENCOUNTER — Encounter: Payer: Self-pay | Admitting: Internal Medicine

## 2023-03-07 DIAGNOSIS — R0600 Dyspnea, unspecified: Secondary | ICD-10-CM

## 2023-03-18 ENCOUNTER — Other Ambulatory Visit (HOSPITAL_COMMUNITY): Payer: Medicare Other

## 2023-03-25 ENCOUNTER — Ambulatory Visit: Payer: Medicare Other | Attending: Internal Medicine | Admitting: Internal Medicine

## 2023-03-25 VITALS — BP 80/54 | HR 88 | Ht 72.0 in | Wt 250.4 lb

## 2023-03-25 DIAGNOSIS — R0600 Dyspnea, unspecified: Secondary | ICD-10-CM | POA: Insufficient documentation

## 2023-03-25 NOTE — Patient Instructions (Signed)
Medication Instructions:  Your physician has recommended you make the following change in your medication:  STOP: Metoprolol succinate (Toprol-XL)  *If you need a refill on your cardiac medications before your next appointment, please call your pharmacy*   Lab Work: None   Testing/Procedures: None   Follow-Up: At [facility], you and your health needs are our priority.  As part of our continuing mission to provide you with exceptional heart care, we have created designated Provider Care Teams.  These Care Teams include your primary Cardiologist (physician) and Advanced Practice Providers (APPs -  Physician Assistants and Nurse Practitioners) who all work together to provide you with the care you need, when you need it.    Your next appointment:   3 month(s)  Provider:   Maisie Fus, MD

## 2023-03-25 NOTE — Progress Notes (Signed)
Cardiology Office Note:    Date:  03/25/2023   ID:  Alex Kemp, DOB [birth year], MRN [id number]  PCP:  Corwin Levins, MD   [facility] Providers Cardiologist:  Maisie Fus, MD     Referring MD: Corwin Levins, MD   No chief complaint on file. PE  History of Present Illness:    Alex Kemp is a 82 y.o. male with a hx of below, he was an [employer] and he fell. He then fell in 02/22/2022.  He came to the ED with SOB. had DVT/PE found 05/14/2022, echo 07/2021-showed normal LV fxn, RV is only mildly enlarged, no structural heart dx, which can be seen with aging. He went to the ED found to have DVT 12/19 and PE. No evidence of cardiac strain. EKG shows NSR, 1st degree AV block, frequent PVCs.  RVOT PVCs.He was started on [facility]. Noted was planned to get admitted. He was seen by vascular surgery for consideration of thrombectomy, determined not a good candidate. He stated that he was told he did not not require admission by vascular sx. He was recommended to take xarelto. Cardiology referral was made for? Notes " on patient's behalf".   His CT scan showed 3 vessel dx.  He has hx of PAD s/p PCI left superficial femoral artery and PCI L posterior tibial artery 08/15/2020. He's on plavix as well as aspirin for PAD.   No recent echo  Notes Bp fluctutate high and low. He can't walk far.   CT 05/13/2022 1. Focus of nonocclusive embolus within the distal right pulmonary artery as well as bandlike embolus in the right lower lobe pulmonary artery. This may be nonacute given appearance, although pulmonary embolus is strictly age indeterminate by CT. 2. Trace bilateral pleural effusions. 3. Mild, diffuse bilateral bronchial wall thickening, consistent with nonspecific infectious or inflammatory bronchitis. 4. Global cardiomegaly and coronary artery disease.  Interim 09/23/2022 Recommended to return by his PCP Dr. Jonny Ruiz for SOB.  Received albuterol. Unclear if asthma. He notes this helps.  He was  started on lasix. Stopped 2/2 frequent urination. Then restarted. Weights stable 255 in Jan., today 253. He notes getting hot flashes with sitting.  He denies angina, lower extremity edema, PND or orthopnea.   Interim hx 03/25/2923 His BP was low, so lasix was stopped. He transitioned to assisted living; he lives down the road from his prior house. BP is low today. He is asymptomatic. He takes cortisone shots; he's only had one  Current Medications: Current Outpatient Medications on File Prior to Visit  Medication Sig Dispense Refill   albuterol (VENTOLIN HFA) 108 (90 Base) MCG/ACT inhaler TAKE 2 PUFFS BY MOUTH EVERY 6 HOURS AS NEEDED FOR WHEEZE OR SHORTNESS OF BREATH 8.5 each 5   alfuzosin (UROXATRAL) 10 MG 24 hr tablet Take 1 tablet (10 mg total) by mouth daily. 90 tablet 3   allopurinol (ZYLOPRIM) 100 MG tablet TAKE 1 TABLET DAILY. 90 tablet 1   Ascorbic Acid (VITAMIN C PO) Take 1,000 mg by mouth daily at 6 (six) AM.     b complex vitamins tablet Take 1 tablet by mouth daily with lunch.     cetirizine (ZYRTEC) 10 MG tablet Take 10 mg by mouth daily.     Cholecalciferol (VITAMIN D) 50 MCG (2000 UT) tablet Take 2,000 Units by mouth daily.     cyanocobalamin 1000 MCG tablet Take 1,000 mcg by mouth daily.     diphenoxylate-atropine (LOMOTIL) 2.5-0.025 MG tablet 1 tab by  mouth every 6 hrs as needed (Patient taking differently: Take 1 tablet by mouth every 6 (six) hours as needed for diarrhea or loose stools.) 35 tablet 1   ezetimibe (ZETIA) 10 MG tablet Take 1 tablet (10 mg total) by mouth daily. 90 tablet 3   finasteride (PROSCAR) 5 MG tablet Take 5 mg by mouth daily.     Fluticasone-Umeclidin-Vilant (TRELEGY ELLIPTA) 100-62.5-25 MCG/ACT AEPB Inhale 1 puff into the lungs daily. 1 each 11   Glucosamine HCl (GLUCOSAMINE PO) Take 1 tablet by mouth daily with lunch.     metoprolol succinate (TOPROL-XL) 50 MG 24 hr tablet Take 1 tablet (50 mg total) by mouth daily. Take with or immediately following  a meal. 90 tablet 3   Multiple Vitamin (MULTIVITAMIN WITH MINERALS) TABS tablet Take 1 tablet by mouth daily with lunch.     nitroGLYCERIN (NITROSTAT) 0.4 MG SL tablet Place 1 tablet (0.4 mg total) under the tongue every 5 (five) minutes as needed for chest pain. 90 tablet 3   pantoprazole (PROTONIX) 40 MG tablet TAKE 1 TABLET TWICE A DAY (Patient taking differently: Take 40 mg by mouth daily.) 180 tablet 2   Polyvinyl Alcohol-Povidone (REFRESH OP) Place 1 drop into both eyes 2 (two) times daily.     rivaroxaban (XARELTO) 20 MG TABS tablet Take 1 tablet (20 mg total) by mouth daily with supper. 90 tablet 3   rOPINIRole (REQUIP) 1 MG tablet Take 1 tablet (1 mg total) by mouth at bedtime. 90 tablet 3   rosuvastatin (CRESTOR) 40 MG tablet Take 1 tablet (40 mg total) by mouth daily. 90 tablet 3   temazepam (RESTORIL) 30 MG capsule TAKE 1 CAPSULE AT BEDTIME  AS NEEDED FOR SLEEP 90 capsule 1   triamcinolone (NASACORT) 55 MCG/ACT AERO nasal inhaler Place 2 sprays into the nose daily. (Patient taking differently: Place 2 sprays into the nose in the morning.) 1 each 12   valsartan (DIOVAN) 40 MG tablet Take 1 tablet (40 mg total) by mouth daily. 90 tablet 3   No current facility-administered medications on file prior to visit.     Allergies:   Penicillins   Social History   Socioeconomic History   Marital status: Significant Other    Spouse name: Not on file   Number of children: 1   Years of education: Not on file   Highest education level: Professional school degree (e.g., MD, DDS, DVM, JD)  Occupational History   Occupation: [employer] being flexible  Tobacco Use   Smoking status: Former    Current packs/day: 0.00    Average packs/day: 2.0 packs/day for 20.0 years (40.0 ttl pk-yrs)    Types: Cigarettes    Start date: 12/26/1958    Quit date: 12/26/1978    Years since quitting: 44.2    Passive exposure: Never   Smokeless tobacco: Never  Vaping Use   Vaping status: Never Used  Substance and  Sexual Activity   Alcohol use: Yes    Comment: 04/23/2017 "couple glasses of wine/month"   Drug use: Never   Sexual activity: Yes    Birth control/protection: None  Other Topics Concern   Not on file  Social History Narrative   Not on file   Social Determinants of Health   Financial Resource Strain: Low Risk  (09/08/2022)   Overall Financial Resource Strain (CARDIA)    Difficulty of Paying Living Expenses: Not very hard  Food Insecurity: No Food Insecurity (09/08/2022)   Hunger Vital Sign    Worried About  Running Out of Food in the Last Year: Never true    Ran Out of Food in the Last Year: Never true  Transportation Needs: No Transportation Needs (09/08/2022)   PRAPARE - Administrator, Civil Service (Medical): No    Lack of Transportation (Non-Medical): No  Recent Concern: Transportation Needs - Unmet Transportation Needs (07/31/2022)   PRAPARE - Transportation    Lack of Transportation (Medical): Yes    Lack of Transportation (Non-Medical): Yes  Physical Activity: Inactive (09/08/2022)   Exercise Vital Sign    Days of Exercise per Week: 0 days    Minutes of Exercise per Session: 20 min  Stress: No Stress Concern Present (09/08/2022)   [employer] of Occupational Health - Occupational Stress Questionnaire    Feeling of Stress : Not at all  Social Connections: Moderately Integrated (09/08/2022)   Social Connection and Isolation Panel [NHANES]    Frequency of Communication with Friends and Family: Twice a week    Frequency of Social Gatherings with Friends and Family: Twice a week    Attends Religious Services: Never    Database administrator or Organizations: Yes    Attends Engineer, structural: 1 to 4 times per year    Marital Status: Living with partner     Family History: The patient's family history includes Arthritis in an other family member.  ROS:   Please see the history of present illness.     All other systems reviewed and are  negative.  EKGs/Labs/Other Studies Reviewed:    The following studies were reviewed today:   EKG:  EKG is  ordered today.  The ekg ordered today demonstrates   05/28/2022- NSR with 1st degree AV block with PACs, Qtc 554 ms  03/25/2023- sinus rhythm HR 61 bpm, 1st degree AV block  TTE  07/26/2021- EF 55-60%, normal RV fxn, RV mildly enlarged, mild MR  TTE 06/25/2022-  EF 50-55%, RV fxn noted mild reduction, mild MR    Coronary CTA 09/27/2022 IMPRESSION: 1. Coronary calcium score of 1796. This was 82nd percentile for age-, sex, and race-matched controls.   2. Normal coronary origin with right dominance.   3. Mild ostial left main stenosis (25-49%).   4. Severe proximal to mid LAD stenosis (70-99%).   5. Severe proximal LCX stenosis (70-99%).   6. Severe distal LCX/OM2 lesion (70-99%).   7. Moderate plaque in the proximal and mid RCA (50-69%).   8. Dilated pulmonary artery suggestive of pulmonary hypertension.   RECOMMENDATIONS: 1. Would recommend cardiac catheterization given concerns for 3-vessel CAD. Consider symptom-guided anti-ischemic pharmacotherapy as well as risk factor modification per guideline directed care. Invasive coronary angiography recommended with revascularization per published guideline statements.    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 Borderline positive, albeit not favorable for PCI as it will likely require crossing to diagonal branches. Recommend medical therapy. Proximal LCx eccentric 55 to 60% stenosis with RFR of 0.97, and mild to moderate diffuse to disease in the mid LCx-OM 2 Mild to moderate diffuse disease ranging from 30 to 50% throughout the proximal to mid RCA-not flow-limiting.   Mild to moderately elevated LVEDP of 19 mmHg in the  setting of systemic hypertension Preserved LVEF by LV gram although difficult to assess wall motion.  RECOMMENDATIONS Based on the location of the borderline positive RFR lesion in the LAD, not very favorable for PCI due to necessity to cross 2 diagonal branches.  Recommend medical management for now. Would be reluctant to place stent in the LAD if there is no guarantee of improved symptoms, especially with the patient already being on DOAC for PEs. Would recommend titrating medical management in order to treat symptoms while on DOAC.  Can reassess symptomatology once off DOAC and if still having concerning symptoms, we could consider PCI at that time which would then avoid antiplatelet plus antithrombotic agent. I have added valsartan 40 mg daily for additional blood pressure control /afterload reduction given hypertension and elevated LVEDP. Will defer to primary cardiologist for initiation of antianginal therapy.  Recent Labs: 09/12/2022: ALT 23; Pro B Natriuretic peptide (BNP) 210.0; TSH 2.02 10/01/2022: BUN 18; Creatinine, Ser 1.62; Hemoglobin 12.3; Platelets 211; Potassium 4.9; Sodium 144   Recent Lipid Panel    Component Value Date/Time   CHOL 131 09/12/2022 1422   TRIG 134.0 09/12/2022 1422   HDL 56.50 09/12/2022 1422   CHOLHDL 2 09/12/2022 1422   VLDL 26.8 09/12/2022 1422   LDLCALC 48 09/12/2022 1422   LDLDIRECT 75.0 01/18/2022 1506     Risk Assessment/Calculations:     Physical Exam:    VS:   Vitals:   03/25/23 1445  BP: (!) 80/54  Pulse: 88  SpO2: 95%      Wt Readings from Last 3 Encounters:  01/14/23 250 lb (113.4 kg)  10/04/22 255 lb (115.7 kg)  09/23/22 253 lb 6.4 oz (114.9 kg)     GEN:  Well nourished, well developed in no acute distress HEENT: Normal NECK: No JVD; No carotid bruits LYMPHATICS: No lymphadenopathy CARDIAC: RRR, no murmurs, rubs, gallops RESPIRATORY:  Clear to auscultation without rales, wheezing or rhonchi  ABDOMEN: Soft, non-tender,  non-distended MUSCULOSKELETAL:  No edema; No deformity  SKIN: Warm and dry NEUROLOGIC:  Alert and oriented x 3 PSYCHIATRIC:  Normal affect   ASSESSMENT:    Chronic SOB: BNP 210. Mild. No orthopnea or PND or LE edema. RV fxn was not significant reduced in the past.  He has normal LV function.  It is possible his shortness of breath was related to his prior PE.  He has no pulmonary hypertension. -He had mildly positive RFR lesion unclear if this is contributing to his shortness of breath. He has no signs of CHF. -If his next echocardiogram is unchanged he does not need surveillance echo - agree with PFTs; pending scheduling   Hypotension asymptomatic Unclear etiology. Not related to cardiogenic shock Off lasix Will stop BB CT showed normal adrenal glands  Provoked PE:  On xarelto. He can hold this for cataract surgery for any duration needed per his ophthalmologist.   CAD/PAD: coronary CTA showed 3V disease on CT 04/2022.  He was on plavix/asa.  On AC now. Stopping BB.   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  PLAN:    In order of problems listed above:   Follow up 3 months           Medication Adjustments/Labs and Tests Ordered: Current medicines are reviewed at length with the patient today.  Concerns regarding medicines are outlined above.  No orders of the defined types were placed in this encounter.  No orders of the defined types were placed in this encounter.   There are no Patient Instructions on file for this visit.   Signed, Brock Mokry,  Alben Spittle, MD  03/25/2023 2:48 PM    [facility]

## 2023-03-26 ENCOUNTER — Encounter: Payer: Self-pay | Admitting: Internal Medicine

## 2023-03-26 DIAGNOSIS — R0600 Dyspnea, unspecified: Secondary | ICD-10-CM

## 2023-03-28 ENCOUNTER — Ambulatory Visit (HOSPITAL_COMMUNITY): Payer: Medicare Other | Attending: Internal Medicine

## 2023-03-28 DIAGNOSIS — R0600 Dyspnea, unspecified: Secondary | ICD-10-CM | POA: Insufficient documentation

## 2023-03-28 LAB — ECHOCARDIOGRAM COMPLETE
Calc EF: 61.1 %
S' Lateral: 3.9 cm
Single Plane A2C EF: 60.3 %
Single Plane A4C EF: 63.3 %

## 2023-04-16 ENCOUNTER — Other Ambulatory Visit (HOSPITAL_COMMUNITY)
Admission: RE | Admit: 2023-04-16 | Discharge: 2023-04-16 | Disposition: A | Discharge status: Home / Self Care | Payer: Medicare Other | Source: Ambulatory Visit | Attending: Oncology | Admitting: Oncology

## 2023-04-16 DIAGNOSIS — Z006 Encounter for examination for normal comparison and control in clinical research program: Secondary | ICD-10-CM | POA: Insufficient documentation

## 2023-04-16 HISTORY — PX: TRANSTHORACIC ECHOCARDIOGRAM: SHX275

## 2023-04-29 LAB — GENECONNECT MOLECULAR SCREEN: Genetic Analysis Overall Interpretation: NEGATIVE

## 2023-05-02 ENCOUNTER — Other Ambulatory Visit: Payer: Self-pay | Admitting: Internal Medicine

## 2023-05-13 ENCOUNTER — Encounter: Payer: Self-pay | Admitting: Internal Medicine

## 2023-05-14 ENCOUNTER — Telehealth: Payer: Self-pay

## 2023-05-14 ENCOUNTER — Other Ambulatory Visit: Payer: Self-pay

## 2023-05-14 ENCOUNTER — Other Ambulatory Visit: Payer: Self-pay | Admitting: Internal Medicine

## 2023-05-14 ENCOUNTER — Other Ambulatory Visit (HOSPITAL_COMMUNITY): Payer: Self-pay

## 2023-05-14 NOTE — Telephone Encounter (Signed)
Pharmacy Patient Advocate Encounter   Received notification from Pt Calls Messages that prior authorization for Temazepam 30mg  caps is required/requested.   Insurance verification completed.   The patient is insured through [facility] Medicare.    Per test claim: PA required; PA submitted to above mentioned insurance via Phone Key/confirmation #/EOC [id number] Status is pending   Phone# [phone]

## 2023-05-15 NOTE — Telephone Encounter (Signed)
Pharmacy Patient Advocate Encounter  Received notification from [facility] that Prior Authorization forTemazepam 30mg  caps has been DENIED.  Full denial letter will be uploaded to the media tab. See denial reason below.

## 2023-05-19 DIAGNOSIS — H52203 Unspecified astigmatism, bilateral: Secondary | ICD-10-CM | POA: Diagnosis not present

## 2023-05-19 DIAGNOSIS — H25013 Cortical age-related cataract, bilateral: Secondary | ICD-10-CM | POA: Diagnosis not present

## 2023-05-19 DIAGNOSIS — H2513 Age-related nuclear cataract, bilateral: Secondary | ICD-10-CM | POA: Diagnosis not present

## 2023-05-19 DIAGNOSIS — H43813 Vitreous degeneration, bilateral: Secondary | ICD-10-CM | POA: Diagnosis not present

## 2023-05-19 DIAGNOSIS — H5213 Myopia, bilateral: Secondary | ICD-10-CM | POA: Diagnosis not present

## 2023-05-25 ENCOUNTER — Encounter: Payer: Self-pay | Admitting: Internal Medicine

## 2023-05-26 DIAGNOSIS — L814 Other melanin hyperpigmentation: Secondary | ICD-10-CM | POA: Diagnosis not present

## 2023-05-26 DIAGNOSIS — D225 Melanocytic nevi of trunk: Secondary | ICD-10-CM | POA: Diagnosis not present

## 2023-05-26 DIAGNOSIS — L570 Actinic keratosis: Secondary | ICD-10-CM | POA: Diagnosis not present

## 2023-05-26 DIAGNOSIS — L821 Other seborrheic keratosis: Secondary | ICD-10-CM | POA: Diagnosis not present

## 2023-05-26 DIAGNOSIS — Z85828 Personal history of other malignant neoplasm of skin: Secondary | ICD-10-CM | POA: Diagnosis not present

## 2023-05-26 MED ORDER — ALPRAZOLAM 0.5 MG PO TABS: ORAL_TABLET | ORAL | 1 refills | Status: DC

## 2023-05-26 MED ORDER — TRAZODONE HCL 50 MG PO TABS: 25.0000 mg | ORAL_TABLET | Freq: Every evening | ORAL | 1 refills | Status: DC | PRN

## 2023-05-26 NOTE — Addendum Note (Signed)
Addended by: Corwin Levins on: 05/26/2023 03:23 PM   Modules accepted: Orders

## 2023-05-30 ENCOUNTER — Other Ambulatory Visit (HOSPITAL_COMMUNITY): Payer: Self-pay

## 2023-05-31 ENCOUNTER — Other Ambulatory Visit (HOSPITAL_COMMUNITY): Payer: Self-pay
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
## 2023-06-02 ENCOUNTER — Ambulatory Visit (INDEPENDENT_AMBULATORY_CARE_PROVIDER_SITE_OTHER): Payer: Medicare Other

## 2023-06-02 ENCOUNTER — Ambulatory Visit (INDEPENDENT_AMBULATORY_CARE_PROVIDER_SITE_OTHER): Payer: Medicare Other | Admitting: Vascular Surgery

## 2023-06-02 ENCOUNTER — Encounter (INDEPENDENT_AMBULATORY_CARE_PROVIDER_SITE_OTHER): Payer: Self-pay | Admitting: Vascular Surgery

## 2023-06-02 ENCOUNTER — Ambulatory Visit: Payer: Medicare Other | Admitting: Internal Medicine

## 2023-06-02 VITALS — BP 144/73 | HR 76 | Ht 72.0 in | Wt 249.0 lb

## 2023-06-02 DIAGNOSIS — E782 Mixed hyperlipidemia: Secondary | ICD-10-CM

## 2023-06-02 DIAGNOSIS — I10 Essential (primary) hypertension: Secondary | ICD-10-CM | POA: Diagnosis not present

## 2023-06-02 DIAGNOSIS — I70213 Atherosclerosis of native arteries of extremities with intermittent claudication, bilateral legs: Secondary | ICD-10-CM | POA: Diagnosis not present

## 2023-06-02 DIAGNOSIS — K219 Gastro-esophageal reflux disease without esophagitis: Secondary | ICD-10-CM

## 2023-06-02 DIAGNOSIS — I824Y1 Acute embolism and thrombosis of unspecified deep veins of right proximal lower extremity: Secondary | ICD-10-CM

## 2023-06-03 DIAGNOSIS — E291 Testicular hypofunction: Secondary | ICD-10-CM | POA: Diagnosis not present

## 2023-06-03 DIAGNOSIS — N183 Chronic kidney disease, stage 3 unspecified: Secondary | ICD-10-CM | POA: Diagnosis not present

## 2023-06-03 DIAGNOSIS — N5201 Erectile dysfunction due to arterial insufficiency: Secondary | ICD-10-CM | POA: Diagnosis not present

## 2023-06-03 DIAGNOSIS — N401 Enlarged prostate with lower urinary tract symptoms: Secondary | ICD-10-CM | POA: Diagnosis not present

## 2023-06-04 ENCOUNTER — Encounter (INDEPENDENT_AMBULATORY_CARE_PROVIDER_SITE_OTHER): Payer: Self-pay | Admitting: Vascular Surgery

## 2023-06-05 ENCOUNTER — Encounter: Payer: Self-pay | Admitting: Internal Medicine

## 2023-06-06 ENCOUNTER — Ambulatory Visit: Payer: Federal, State, Local not specified - PPO | Admitting: Internal Medicine

## 2023-06-06 ENCOUNTER — Telehealth: Payer: Self-pay | Admitting: Internal Medicine

## 2023-06-06 LAB — VAS US ABI WITH/WO TBI
Left ABI: 1
Right ABI: 1.02

## 2023-06-06 NOTE — Telephone Encounter (Signed)
 Appt has been scheduled.

## 2023-06-06 NOTE — Telephone Encounter (Signed)
 Pt mentioned he is having low BP in the 60s based on recent exam, and unable to recheck BP quickly at home today  Please ask pt to make ROV any provider after stopping diovan  40 mg asap for recheck  Pt should also go to ED for any chest pain, sob, dizziness syncope, fever, chills, bleeding, falls or other unusual symtpoms  thanks

## 2023-06-09 DIAGNOSIS — H25811 Combined forms of age-related cataract, right eye: Secondary | ICD-10-CM | POA: Diagnosis not present

## 2023-06-09 DIAGNOSIS — H25011 Cortical age-related cataract, right eye: Secondary | ICD-10-CM | POA: Diagnosis not present

## 2023-06-09 DIAGNOSIS — H52221 Regular astigmatism, right eye: Secondary | ICD-10-CM | POA: Diagnosis not present

## 2023-06-09 DIAGNOSIS — H2511 Age-related nuclear cataract, right eye: Secondary | ICD-10-CM | POA: Diagnosis not present

## 2023-06-16 ENCOUNTER — Ambulatory Visit: Payer: Medicare Other | Admitting: Internal Medicine

## 2023-06-16 ENCOUNTER — Encounter: Payer: Self-pay | Admitting: Internal Medicine

## 2023-06-16 VITALS — BP 80/50 | HR 110 | Temp 97.8°F | Ht 72.0 in | Wt 245.8 lb

## 2023-06-16 DIAGNOSIS — I2699 Other pulmonary embolism without acute cor pulmonale: Secondary | ICD-10-CM | POA: Diagnosis not present

## 2023-06-16 DIAGNOSIS — R0602 Shortness of breath: Secondary | ICD-10-CM

## 2023-06-16 DIAGNOSIS — R000 Tachycardia, unspecified: Secondary | ICD-10-CM

## 2023-06-16 NOTE — Patient Instructions (Addendum)
It was a pleasure to see you today!  Please schedule follow up scheduled with myself in 2 months.  If my schedule is not open yet, we will contact you with a reminder closer to that time. Please call [phone] if you haven't heard from [place] a month before, and always call us sooner if issues or concerns arise. You can also send [place] a message through MyChart, but but aware that this is not to be used for urgent issues and it may take up to 5-7 days to receive a reply. Please be aware that you will likely be able to view your results before I have a chance to respond to them. Please give [place] 5 business days to respond to any non-urgent results.    Before your next visit I would like you to have: Full set of PFTs  Continue trelegy inhaler 1 puff once daily. I can guide you on additional therapies based on the breathing testing   Take the albuterol rescue inhaler every 4 to 6 hours as needed for wheezing or shortness of breath. You can also take it 15 minutes before exercise or exertional activity. Side effects include heart racing or pounding, jitters or anxiety. If you have a history of an irregular heart rhythm, it can make this worse. Can also give some patients a hard time sleeping.  Please follow up with your heart doctor later this week.
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
## 2023-06-19 ENCOUNTER — Ambulatory Visit: Payer: Federal, State, Local not specified - PPO | Attending: Internal Medicine | Admitting: Internal Medicine

## 2023-06-19 ENCOUNTER — Other Ambulatory Visit: Payer: Self-pay | Admitting: Internal Medicine

## 2023-06-19 ENCOUNTER — Ambulatory Visit (INDEPENDENT_AMBULATORY_CARE_PROVIDER_SITE_OTHER): Payer: Federal, State, Local not specified - PPO

## 2023-06-19 ENCOUNTER — Encounter: Payer: Self-pay | Admitting: Internal Medicine

## 2023-06-19 VITALS — BP 84/70 | HR 122 | Ht 72.0 in | Wt 244.0 lb

## 2023-06-19 DIAGNOSIS — I9589 Other hypotension: Secondary | ICD-10-CM

## 2023-06-19 DIAGNOSIS — I10 Essential (primary) hypertension: Secondary | ICD-10-CM

## 2023-06-19 DIAGNOSIS — R0602 Shortness of breath: Secondary | ICD-10-CM

## 2023-06-19 DIAGNOSIS — I739 Peripheral vascular disease, unspecified: Secondary | ICD-10-CM | POA: Diagnosis not present

## 2023-06-19 DIAGNOSIS — I493 Ventricular premature depolarization: Secondary | ICD-10-CM

## 2023-06-19 DIAGNOSIS — I491 Atrial premature depolarization: Secondary | ICD-10-CM

## 2023-06-19 DIAGNOSIS — I440 Atrioventricular block, first degree: Secondary | ICD-10-CM

## 2023-06-19 MED ORDER — MIDODRINE HCL 5 MG PO TABS: 5.0000 mg | ORAL_TABLET | Freq: Three times a day (TID) | ORAL | 3 refills | Status: DC

## 2023-06-19 NOTE — Progress Notes (Signed)
Cardiology Office Note:    Date:  06/19/2023   ID:  Alex Kemp, DOB 1941-03-15, MRN [id number]  PCP:  Alex Levins, MD   [facility] Providers Cardiologist:  Alex Fus, MD     Referring MD: Alex Levins, MD   No chief complaint on file. PE  History of Present Illness:    Alex Kemp is a 83 y.o. male with a hx of below, he was an [employer] and he fell. He then fell in 02/22/2022.  He came to the ED with SOB. had DVT/PE found 05/14/2022, echo 07/2021-showed normal LV fxn, RV is only mildly enlarged, no structural heart dx, which can be seen with aging. He went to the ED found to have DVT 12/19 and PE. No evidence of cardiac strain. EKG shows NSR, 1st degree AV block, frequent PVCs.  RVOT PVCs.He was started on [facility]. Noted was planned to get admitted. He was seen by vascular surgery for consideration of thrombectomy, determined not a good candidate. He stated that he was told he did not not require admission by vascular sx. He was recommended to take xarelto. Cardiology referral was made for? Notes " on patient's behalf".   His CT scan showed 3 vessel dx.  He has hx of PAD s/p PCI left superficial femoral artery and PCI L posterior tibial artery 08/15/2020. He's on plavix as well as aspirin for PAD.   No recent echo  Notes Bp fluctutate high and low. He can't walk far.   CT 05/13/2022 1. Focus of nonocclusive embolus within the distal right pulmonary artery as well as bandlike embolus in the right lower lobe pulmonary artery. This may be nonacute given appearance, although pulmonary embolus is strictly age indeterminate by CT. 2. Trace bilateral pleural effusions. 3. Mild, diffuse bilateral bronchial wall thickening, consistent with nonspecific infectious or inflammatory bronchitis. 4. Global cardiomegaly and coronary artery disease.  Interim 09/23/2022 Recommended to return by his PCP Dr. Jonny Ruiz for SOB.  Received albuterol. Unclear if asthma. He notes this helps.  He was  started on lasix. Stopped 2/2 frequent urination. Then restarted. Weights stable 255 in Jan., today 253. He notes getting hot flashes with sitting.  He denies angina, lower extremity edema, PND or orthopnea.   Interim hx 03/25/2923 His BP was low, so lasix was stopped. He transitioned to assisted living; he lives down the road from his prior house. BP is low today. He is asymptomatic. He takes cortisone shots; he's only had one  Interim hx 06/19/2023 He lives in assisted living with his wife.  They are very active and participate in different activities.  His BP is still low. Notes that he is feeling some LH. His weight is getting lower. He notes not eating as much. He continues to have dyspnea. He is planned for PFTs.   Current Medications: Current Outpatient Medications on File Prior to Visit  Medication Sig Dispense Refill   albuterol (VENTOLIN HFA) 108 (90 Base) MCG/ACT inhaler TAKE 2 PUFFS BY MOUTH EVERY 6 HOURS AS NEEDED FOR WHEEZE OR SHORTNESS OF BREATH (Patient not taking: Reported on 06/16/2023) 8.5 each 5   alfuzosin (UROXATRAL) 10 MG 24 hr tablet Take 1 tablet (10 mg total) by mouth daily. 90 tablet 3   allopurinol (ZYLOPRIM) 100 MG tablet TAKE 1 TABLET DAILY. 90 tablet 1   Ascorbic Acid (VITAMIN C PO) Take 1,000 mg by mouth daily at 6 (six) AM.     b complex vitamins tablet Take 1 tablet by  mouth daily with lunch.     cetirizine (ZYRTEC) 10 MG tablet Take 10 mg by mouth daily.     Cholecalciferol (VITAMIN D) 50 MCG (2000 UT) tablet Take 2,000 Units by mouth daily.     cyanocobalamin 1000 MCG tablet Take 1,000 mcg by mouth daily.     diphenoxylate-atropine (LOMOTIL) 2.5-0.025 MG tablet 1 tab by mouth every 6 hrs as needed (Patient not taking: Reported on 06/16/2023) 35 tablet 1   ezetimibe (ZETIA) 10 MG tablet Take 1 tablet (10 mg total) by mouth daily. 90 tablet 3   finasteride (PROSCAR) 5 MG tablet Take 5 mg by mouth daily.     Fluticasone-Umeclidin-Vilant (TRELEGY ELLIPTA)  100-62.5-25 MCG/ACT AEPB Inhale 1 puff into the lungs daily. 1 each 11   Glucosamine HCl (GLUCOSAMINE PO) Take 1 tablet by mouth daily with lunch.     Multiple Vitamin (MULTIVITAMIN WITH MINERALS) TABS tablet Take 1 tablet by mouth daily with lunch.     nitroGLYCERIN (NITROSTAT) 0.4 MG SL tablet Place 1 tablet (0.4 mg total) under the tongue every 5 (five) minutes as needed for chest pain. 90 tablet 3   pantoprazole (PROTONIX) 40 MG tablet TAKE 1 TABLET TWICE A DAY. (Patient taking differently: Take 40 mg by mouth daily.) 180 tablet 2   Polyvinyl Alcohol-Povidone (REFRESH OP) Place 1 drop into both eyes 2 (two) times daily.     rivaroxaban (XARELTO) 20 MG TABS tablet Take 1 tablet (20 mg total) by mouth daily with supper. 90 tablet 3   rOPINIRole (REQUIP) 1 MG tablet Take 1 tablet (1 mg total) by mouth at bedtime. 90 tablet 3   rosuvastatin (CRESTOR) 40 MG tablet Take 1 tablet (40 mg total) by mouth daily. 90 tablet 3   triamcinolone (NASACORT) 55 MCG/ACT AERO nasal inhaler Place 2 sprays into the nose daily. (Patient taking differently: Place 2 sprays into the nose in the morning.) 1 each 12   No current facility-administered medications on file prior to visit.     Allergies:   Penicillins   Social History   Socioeconomic History   Marital status: Significant Other    Spouse name: Not on file   Number of children: 1   Years of education: Not on file   Highest education level: Professional school degree (e.g., MD, DDS, DVM, JD)  Occupational History   Occupation: [employer] being flexible  Tobacco Use   Smoking status: Former    Current packs/day: 0.00    Average packs/day: 2.0 packs/day for 20.0 years (40.0 ttl pk-yrs)    Types: Cigarettes    Start date: 12/26/1958    Quit date: 12/26/1978    Years since quitting: 44.5    Passive exposure: Never   Smokeless tobacco: Never  Vaping Use   Vaping status: Never Used  Substance and Sexual Activity   Alcohol use: Yes    Comment:  04/23/2017 "couple glasses of wine/month"   Drug use: Never   Sexual activity: Yes    Birth control/protection: None  Other Topics Concern   Not on file  Social History Narrative   Not on file   Social Drivers of Health   Financial Resource Strain: Low Risk  (09/08/2022)   Overall Financial Resource Strain (CARDIA)    Difficulty of Paying Living Expenses: Not very hard  Food Insecurity: No Food Insecurity (09/08/2022)   Hunger Vital Sign    Worried About Running Out of Food in the Last Year: Never true    Ran Out of Food in  the Last Year: Never true  Transportation Needs: No Transportation Needs (09/08/2022)   PRAPARE - Administrator, Civil Service (Medical): No    Lack of Transportation (Non-Medical): No  Recent Concern: Transportation Needs - Unmet Transportation Needs (07/31/2022)   PRAPARE - Transportation    Lack of Transportation (Medical): Yes    Lack of Transportation (Non-Medical): Yes  Physical Activity: Inactive (09/08/2022)   Exercise Vital Sign    Days of Exercise per Week: 0 days    Minutes of Exercise per Session: 20 min  Stress: No Stress Concern Present (09/08/2022)   [employer] of Occupational Health - Occupational Stress Questionnaire    Feeling of Stress : Not at all  Social Connections: Moderately Integrated (09/08/2022)   Social Connection and Isolation Panel [NHANES]    Frequency of Communication with Friends and Family: Twice a week    Frequency of Social Gatherings with Friends and Family: Twice a week    Attends Religious Services: Never    Database administrator or Organizations: Yes    Attends Engineer, structural: 1 to 4 times per year    Marital Status: Living with partner     Family History: The patient's family history includes Arthritis in an other family member. There is no history of Lung disease.  ROS:   Please see the history of present illness.     All other systems reviewed and are negative.  EKGs/Labs/Other  Studies Reviewed:    The following studies were reviewed today:   EKG:  EKG is  ordered today.  The ekg ordered today demonstrates   05/28/2022- NSR with 1st degree AV block with PACs, Qtc 554 ms  03/25/2023- sinus rhythm HR 61 bpm, 1st degree AV block  TTE  07/26/2021- EF 55-60%, normal RV fxn, RV mildly enlarged, mild MR  TTE 06/25/2022-  EF 50-55%, RV fxn noted mild reduction, mild MR    Coronary CTA 09/27/2022 IMPRESSION: 1. Coronary calcium score of 1796. This was 82nd percentile for age-, sex, and race-matched controls.   2. Normal coronary origin with right dominance.   3. Mild ostial left main stenosis (25-49%).   4. Severe proximal to mid LAD stenosis (70-99%).   5. Severe proximal LCX stenosis (70-99%).   6. Severe distal LCX/OM2 lesion (70-99%).   7. Moderate plaque in the proximal and mid RCA (50-69%).   8. Dilated pulmonary artery suggestive of pulmonary hypertension.   RECOMMENDATIONS: 1. Would recommend cardiac catheterization given concerns for 3-vessel CAD. Consider symptom-guided anti-ischemic pharmacotherapy as well as risk factor modification per guideline directed care. Invasive coronary angiography recommended with revascularization per published guideline statements.    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 Borderline positive, albeit not favorable for PCI as it will likely require crossing to diagonal branches. Recommend medical therapy. Proximal LCx eccentric 55 to 60% stenosis with RFR of 0.97, and mild to moderate diffuse to disease in the mid LCx-OM 2 Mild to moderate diffuse disease ranging from 30 to 50% throughout the proximal to mid RCA-not flow-limiting.   Mild to moderately elevated LVEDP of 19 mmHg in the setting of systemic  hypertension Preserved LVEF by LV gram although difficult to assess wall motion.    RECOMMENDATIONS Based on the location of the borderline positive RFR  lesion in the LAD, not very favorable for PCI due to necessity to cross 2 diagonal branches.  Recommend medical management for now. Would be reluctant to place stent in the LAD if there is no guarantee of improved symptoms, especially with the patient already being on DOAC for PEs. Would recommend titrating medical management in order to treat symptoms while on DOAC.  Can reassess symptomatology once off DOAC and if still having concerning symptoms, we could consider PCI at that time which would then avoid antiplatelet plus antithrombotic agent. I have added valsartan 40 mg daily for additional blood pressure control /afterload reduction given hypertension and elevated LVEDP. Will defer to primary cardiologist for initiation of antianginal therapy.  Recent Labs: 09/12/2022: ALT 23; Pro B Natriuretic peptide (BNP) 210.0; TSH 2.02 10/01/2022: BUN 18; Creatinine, Ser 1.62; Hemoglobin 12.3; Platelets 211; Potassium 4.9; Sodium 144   Recent Lipid Panel    Component Value Date/Time   CHOL 131 09/12/2022 1422   TRIG 134.0 09/12/2022 1422   HDL 56.50 09/12/2022 1422   CHOLHDL 2 09/12/2022 1422   VLDL 26.8 09/12/2022 1422   LDLCALC 48 09/12/2022 1422   LDLDIRECT 75.0 01/18/2022 1506     Risk Assessment/Calculations:     Physical Exam:    VS:   Vitals:   06/19/23 1341  BP: (!) 84/70  Pulse: (!) 122     Wt Readings from Last 3 Encounters:  06/16/23 245 lb 12.8 oz (111.5 kg)  06/02/23 249 lb (112.9 kg)  03/25/23 250 lb 6.4 oz (113.6 kg)     GEN:  Well nourished, well developed in no acute distress HEENT: Normal NECK: No JVD; No carotid bruits LYMPHATICS: No lymphadenopathy CARDIAC: RRR, no murmurs, rubs, gallops RESPIRATORY:  Clear to auscultation without rales, wheezing or rhonchi  ABDOMEN: Soft, non-tender,  non-distended MUSCULOSKELETAL:  No edema; No deformity  SKIN: Warm and dry NEUROLOGIC:  Alert and oriented x 3 PSYCHIATRIC:  Normal affect   ASSESSMENT:    Chronic SOB: BNP 210. Mild. No orthopnea or PND or LE edema. RV fxn was not significant reduced in the past.  He has normal LV function.  No pulmonary hypertension. It is possible his shortness of breath was related to his prior PE.  He has no pulmonary hypertension. -He had mildly positive RFR lesion unclear if this is contributing to his shortness of breath. He has no signs of CHF. Risky to correct , could jail diag. -Pending PFTs with pulm -Has not had afib but not unreasoable to screen -will order a 2 week ziopatch   PVCs PVCs are minimal.  Do not think they are contributing to his clinical presentation. Will assess with ziopatch  Hypotension/sinus tachycardia/ Autonomic dysfunction? asymptomatic -Stopped his beta-blocker and diuretic prior -BP still low despite stopping beta-blocker and Lasix; and prior anti-hypertensive -Recommend starting midodrine 5 mg 3 times daily - AM cortisol  Provoked PE:  On xarelto. He can hold this for cataract surgery for any duration needed per his ophthalmologist.   CAD/PAD: coronary CTA showed 3V disease on CT 04/2022.  Had LAD disease mild  He was on plavix/asa.  On AC now only. Stopping BB.   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  PLAN:    In order of problems listed above:   Follow up 3 months with me   Medication Adjustments/Labs and Tests Ordered: Current medicines are reviewed at length with the patient today.  Concerns regarding medicines are outlined above.  No orders of the defined  types were placed in this encounter.  No orders of the defined types were placed in this encounter.   There are no Patient Instructions on file for this visit.   Signed, Alex Fus, MD  06/19/2023 1:37 PM    [facility]

## 2023-06-19 NOTE — Patient Instructions (Signed)
Medication Instructions:  Midodrine 5 mg 3 times daily  *If you need a refill on your cardiac medications before your next appointment, please call your pharmacy*   Lab Work: AM Cortisol  If you have labs (blood work) drawn today and your tests are completely normal, you will receive your results only by: MyChart Message (if you have MyChart) OR A paper copy in the mail If you have any lab test that is abnormal or we need to change your treatment, we will call you to review the results.   Testing/Procedures: Your physician has recommended that you wear a 14 DAY ZIO-PATCH monitor. The Zio patch cardiac monitor continuously records heart rhythm data for up to 14 days, this is for patients being evaluated for multiple types heart rhythms. For the first 24 hours post application, please avoid getting the Zio monitor wet in the shower or by excessive sweating during exercise. After that, feel free to carry on with regular activities. Keep soaps and lotions away from the ZIO XT Patch.  This will be mailed to you, please expect 7-10 days to receive.    Applying the monitor   Shave hair from upper left chest.   Hold abrader disc by orange tab.  Rub abrader in 40 strokes over left upper chest as indicated in your monitor instructions.   Clean area with 4 enclosed alcohol pads .  Use all pads to assure are is cleaned thoroughly.  Let dry.   Apply patch as indicated in monitor instructions.  Patch will be place under collarbone on left side of chest with arrow pointing upward.   Rub patch adhesive wings for 2 minutes.Remove white label marked "1".  Remove white label marked "2".  Rub patch adhesive wings for 2 additional minutes.   While looking in a mirror, press and release button in center of patch.  A small green light will flash 3-4 times .  This will be your only indicator the monitor has been turned on.     Do not shower for the first 24 hours.  You may shower after the first 24 hours.    Press button if you feel a symptom. You will hear a small click.  Record Date, Time and Symptom in the Patient Log Book.   When you are ready to remove patch, follow instructions on last 2 pages of Patient Log Book.  Stick patch monitor onto last page of Patient Log Book.   Place Patient Log Book in Empire box.  Use locking tab on box and tape box closed securely.  The [employer] and [employer] has [employer] on it.  Please place in mailbox as soon as possible.  Your physician should have your test results approximately 7 days after the monitor has been mailed back to [facility].   Call [facility] Customer Care at [phone] if you have questions regarding your ZIO XT patch monitor.  Call them immediately if you see an orange light blinking on your monitor.   If your monitor falls off in less than 4 days contact our Monitor department at [phone].  If your monitor becomes loose or falls off after 4 days call [employer] at [phone] for suggestions on securing your monitor    Follow-Up: At [facility], you and your health needs are our priority.  As part of our continuing mission to provide you with exceptional heart care, we have created designated Provider Care Teams.  These Care Teams include your primary Cardiologist (physician) and Advanced  Practice Providers (APPs -  Physician Assistants and Nurse Practitioners) who all work together to provide you with the care you need, when you need it.  We recommend signing up for the patient portal called "MyChart".  Sign up information is provided on this After Visit Summary.  MyChart is used to connect with patients for Virtual Visits (Telemedicine).  Patients are able to view lab/test results, encounter notes, upcoming appointments, etc.  Non-urgent messages can be sent to your provider as well.   To learn more about what you can do with MyChart, go to [email or web address].    Your next appointment:   3  month(s)  Provider:   Maisie Fus, MD     Other Instructions

## 2023-06-19 NOTE — Progress Notes (Unsigned)
Enrolled for [employer] to mail a ZIO XT long term holter monitor to the patients address on file.  

## 2023-06-23 DIAGNOSIS — H52222 Regular astigmatism, left eye: Secondary | ICD-10-CM | POA: Diagnosis not present

## 2023-06-23 DIAGNOSIS — H25812 Combined forms of age-related cataract, left eye: Secondary | ICD-10-CM | POA: Diagnosis not present

## 2023-06-24 DIAGNOSIS — I493 Ventricular premature depolarization: Secondary | ICD-10-CM | POA: Diagnosis not present

## 2023-06-24 DIAGNOSIS — R0602 Shortness of breath: Secondary | ICD-10-CM | POA: Diagnosis not present

## 2023-06-24 DIAGNOSIS — N2581 Secondary hyperparathyroidism of renal origin: Secondary | ICD-10-CM | POA: Diagnosis not present

## 2023-06-24 DIAGNOSIS — N1832 Chronic kidney disease, stage 3b: Secondary | ICD-10-CM | POA: Diagnosis not present

## 2023-06-24 DIAGNOSIS — I440 Atrioventricular block, first degree: Secondary | ICD-10-CM | POA: Diagnosis not present

## 2023-06-24 DIAGNOSIS — I491 Atrial premature depolarization: Secondary | ICD-10-CM

## 2023-06-24 DIAGNOSIS — D631 Anemia in chronic kidney disease: Secondary | ICD-10-CM | POA: Diagnosis not present

## 2023-06-24 DIAGNOSIS — I129 Hypertensive chronic kidney disease with stage 1 through stage 4 chronic kidney disease, or unspecified chronic kidney disease: Secondary | ICD-10-CM | POA: Diagnosis not present

## 2023-06-25 ENCOUNTER — Ambulatory Visit: Payer: Federal, State, Local not specified - PPO | Admitting: Internal Medicine

## 2023-06-25 NOTE — Telephone Encounter (Signed)
Copied from CRM [id number]. Topic: General - Other >> Jun 25, 2023 10:33 AM Irine Seal wrote: Reason for CRM: Patient wanted to make Dr. Oliver Barre aware that at his cardiology visit last week he was given a portable  heart monitor, and will have the results of those at his next visit.

## 2023-06-28 ENCOUNTER — Encounter: Payer: Self-pay | Admitting: Internal Medicine

## 2023-06-28 DIAGNOSIS — E538 Deficiency of other specified B group vitamins: Secondary | ICD-10-CM

## 2023-06-28 DIAGNOSIS — N1831 Chronic kidney disease, stage 3a: Secondary | ICD-10-CM

## 2023-06-28 DIAGNOSIS — R7303 Prediabetes: Secondary | ICD-10-CM

## 2023-06-28 DIAGNOSIS — E782 Mixed hyperlipidemia: Secondary | ICD-10-CM

## 2023-06-28 DIAGNOSIS — R5383 Other fatigue: Secondary | ICD-10-CM

## 2023-06-28 DIAGNOSIS — E559 Vitamin D deficiency, unspecified: Secondary | ICD-10-CM

## 2023-07-10 ENCOUNTER — Ambulatory Visit: Payer: Medicare Other

## 2023-07-10 ENCOUNTER — Other Ambulatory Visit (INDEPENDENT_AMBULATORY_CARE_PROVIDER_SITE_OTHER): Payer: Medicare Other

## 2023-07-10 DIAGNOSIS — N1831 Chronic kidney disease, stage 3a: Secondary | ICD-10-CM | POA: Diagnosis not present

## 2023-07-10 DIAGNOSIS — E782 Mixed hyperlipidemia: Secondary | ICD-10-CM | POA: Diagnosis not present

## 2023-07-10 DIAGNOSIS — R5383 Other fatigue: Secondary | ICD-10-CM | POA: Diagnosis not present

## 2023-07-10 DIAGNOSIS — E538 Deficiency of other specified B group vitamins: Secondary | ICD-10-CM

## 2023-07-10 DIAGNOSIS — E559 Vitamin D deficiency, unspecified: Secondary | ICD-10-CM | POA: Diagnosis not present

## 2023-07-10 DIAGNOSIS — R7303 Prediabetes: Secondary | ICD-10-CM | POA: Diagnosis not present

## 2023-07-10 LAB — CBC WITH DIFFERENTIAL/PLATELET
Basophils Absolute: 0 10*3/uL (ref 0.0–0.1)
Basophils Relative: 0.7 % (ref 0.0–3.0)
Eosinophils Absolute: 0.2 10*3/uL (ref 0.0–0.7)
Eosinophils Relative: 2.7 % (ref 0.0–5.0)
HCT: 39.2 % (ref 39.0–52.0)
Hemoglobin: 13.2 g/dL (ref 13.0–17.0)
Lymphocytes Relative: 26.5 % (ref 12.0–46.0)
Lymphs Abs: 1.8 10*3/uL (ref 0.7–4.0)
MCHC: 33.7 g/dL (ref 30.0–36.0)
MCV: 89.2 fL (ref 78.0–100.0)
Monocytes Absolute: 0.5 10*3/uL (ref 0.1–1.0)
Monocytes Relative: 6.7 % (ref 3.0–12.0)
Neutro Abs: 4.4 10*3/uL (ref 1.4–7.7)
Neutrophils Relative %: 63.4 % (ref 43.0–77.0)
Platelets: 221 10*3/uL (ref 150.0–400.0)
RBC: 4.39 Mil/uL (ref 4.22–5.81)
RDW: 14.5 % (ref 11.5–15.5)
WBC: 6.9 10*3/uL (ref 4.0–10.5)

## 2023-07-10 LAB — LIPID PANEL
Cholesterol: 133 mg/dL (ref 0–200)
HDL: 64.2 mg/dL (ref >39.00)
LDL Cholesterol: 52 mg/dL (ref 0–99)
NonHDL: 68.64
Total CHOL/HDL Ratio: 2
Triglycerides: 83 mg/dL (ref 0.0–149.0)
VLDL: 16.6 mg/dL (ref 0.0–40.0)

## 2023-07-10 LAB — URINALYSIS, ROUTINE W REFLEX MICROSCOPIC
Bilirubin Urine: NEGATIVE
Hgb urine dipstick: NEGATIVE
Leukocytes,Ua: NEGATIVE
Nitrite: NEGATIVE
RBC / HPF: NONE SEEN (ref 0–?)
Specific Gravity, Urine: 1.025 (ref 1.000–1.030)
Total Protein, Urine: NEGATIVE
Urine Glucose: NEGATIVE
Urobilinogen, UA: 0.2 (ref 0.0–1.0)
pH: 5.5 (ref 5.0–8.0)

## 2023-07-10 LAB — BASIC METABOLIC PANEL
BUN: 18 mg/dL (ref 6–23)
CO2: 25 meq/L (ref 19–32)
Calcium: 9.1 mg/dL (ref 8.4–10.5)
Chloride: 107 meq/L (ref 96–112)
Creatinine, Ser: 1.39 mg/dL (ref 0.40–1.50)
GFR: 47.48 mL/min — ABNORMAL LOW (ref >60.00)
Glucose, Bld: 158 mg/dL — ABNORMAL HIGH (ref 70–99)
Potassium: 4.4 meq/L (ref 3.5–5.1)
Sodium: 141 meq/L (ref 135–145)

## 2023-07-10 LAB — TSH: TSH: 2.92 u[IU]/mL (ref 0.35–5.50)

## 2023-07-10 LAB — HEPATIC FUNCTION PANEL
ALT: 16 U/L (ref 0–53)
AST: 16 U/L (ref 0–37)
Albumin: 3.9 g/dL (ref 3.5–5.2)
Alkaline Phosphatase: 95 U/L (ref 39–117)
Bilirubin, Direct: 0.2 mg/dL (ref 0.0–0.3)
Total Bilirubin: 0.5 mg/dL (ref 0.2–1.2)
Total Protein: 6.7 g/dL (ref 6.0–8.3)

## 2023-07-10 LAB — VITAMIN D 25 HYDROXY (VIT D DEFICIENCY, FRACTURES): VITD: 36.03 ng/mL (ref 30.00–100.00)

## 2023-07-10 LAB — MICROALBUMIN / CREATININE URINE RATIO
Creatinine,U: 134.6 mg/dL
Microalb Creat Ratio: 8.8 mg/g (ref 0.0–30.0)
Microalb, Ur: 1.2 mg/dL (ref 0.0–1.9)

## 2023-07-10 LAB — VITAMIN B12: Vitamin B-12: >1537 pg/mL — ABNORMAL HIGH (ref 211–911)

## 2023-07-10 LAB — CORTISOL: Cortisol, Plasma: 10.3 ug/dL

## 2023-07-10 LAB — HEMOGLOBIN A1C: Hgb A1c MFr Bld: 6.6 % — ABNORMAL HIGH (ref 4.6–6.5)

## 2023-07-12 ENCOUNTER — Encounter: Payer: Self-pay | Admitting: Internal Medicine

## 2023-07-12 LAB — PTH, INTACT AND CALCIUM
Calcium: 9.2 mg/dL (ref 8.6–10.3)
PTH: 99 pg/mL — ABNORMAL HIGH (ref 16–77)

## 2023-07-17 ENCOUNTER — Encounter: Payer: Self-pay | Admitting: *Deleted

## 2023-07-17 ENCOUNTER — Ambulatory Visit: Payer: Medicare Other | Admitting: Internal Medicine

## 2023-07-17 DIAGNOSIS — I493 Ventricular premature depolarization: Secondary | ICD-10-CM | POA: Diagnosis not present

## 2023-07-17 DIAGNOSIS — R0602 Shortness of breath: Secondary | ICD-10-CM | POA: Diagnosis not present

## 2023-07-18 ENCOUNTER — Encounter: Payer: Self-pay | Admitting: Internal Medicine

## 2023-07-18 NOTE — Addendum Note (Signed)
Addended by: Corwin Levins on: 07/18/2023 08:02 AM   Modules accepted: Orders

## 2023-07-18 NOTE — Progress Notes (Signed)
Ok for endo referral to consider stim testing   I will place the ordre

## 2023-07-18 NOTE — Progress Notes (Signed)
Ok for referral to endo - done

## 2023-07-21 ENCOUNTER — Ambulatory Visit (HOSPITAL_BASED_OUTPATIENT_CLINIC_OR_DEPARTMENT_OTHER): Payer: Medicare Other | Admitting: Internal Medicine

## 2023-07-21 DIAGNOSIS — R0602 Shortness of breath: Secondary | ICD-10-CM

## 2023-07-21 LAB — PULMONARY FUNCTION TEST
DL/VA % pred: 115 %
DL/VA: 4.42 ml/min/mmHg/L
DLCO cor % pred: 101 %
DLCO cor: 26.04 ml/min/mmHg
DLCO unc % pred: 96 %
DLCO unc: 24.95 ml/min/mmHg
FEF 25-75 Post: 4.25 L/s
FEF 25-75 Pre: 3.69 L/s
FEF2575-%Change-Post: 15 %
FEF2575-%Pred-Post: 200 %
FEF2575-%Pred-Pre: 174 %
FEV1-%Change-Post: 5 %
FEV1-%Pred-Post: 87 %
FEV1-%Pred-Pre: 82 %
FEV1-Post: 2.69 L
FEV1-Pre: 2.54 L
FEV1FVC-%Change-Post: -5 %
FEV1FVC-%Pred-Pre: 123 %
FEV6-%Change-Post: 12 %
FEV6-%Pred-Post: 79 %
FEV6-%Pred-Pre: 70 %
FEV6-Post: 3.21 L
FEV6-Pre: 2.85 L
FEV6FVC-%Pred-Post: 106 %
FEV6FVC-%Pred-Pre: 106 %
FVC-%Change-Post: 11 %
FVC-%Pred-Post: 74 %
FVC-%Pred-Pre: 66 %
FVC-Post: 3.21 L
FVC-Pre: 2.87 L
Post FEV1/FVC ratio: 84 %
Post FEV6/FVC ratio: 100 %
Pre FEV1/FVC ratio: 88 %
Pre FEV6/FVC Ratio: 100 %
RV % pred: 118 %
RV: 3.3 L
TLC % pred: 84 %
TLC: 6.31 L

## 2023-07-21 NOTE — Patient Instructions (Signed)
 Full PFT Performed Today

## 2023-07-21 NOTE — Progress Notes (Signed)
 Full PFT Performed Today

## 2023-07-24 ENCOUNTER — Ambulatory Visit (INDEPENDENT_AMBULATORY_CARE_PROVIDER_SITE_OTHER): Payer: Medicare Other | Admitting: Internal Medicine

## 2023-07-24 ENCOUNTER — Encounter: Payer: Self-pay | Admitting: Internal Medicine

## 2023-07-24 VITALS — BP 110/68 | HR 85 | Temp 98.5°F | Ht 72.0 in | Wt 249.0 lb

## 2023-07-24 DIAGNOSIS — Z0001 Encounter for general adult medical examination with abnormal findings: Secondary | ICD-10-CM

## 2023-07-24 DIAGNOSIS — E559 Vitamin D deficiency, unspecified: Secondary | ICD-10-CM

## 2023-07-24 DIAGNOSIS — E538 Deficiency of other specified B group vitamins: Secondary | ICD-10-CM

## 2023-07-24 DIAGNOSIS — I959 Hypotension, unspecified: Secondary | ICD-10-CM

## 2023-07-24 DIAGNOSIS — R7303 Prediabetes: Secondary | ICD-10-CM | POA: Diagnosis not present

## 2023-07-24 DIAGNOSIS — N1831 Chronic kidney disease, stage 3a: Secondary | ICD-10-CM

## 2023-07-24 DIAGNOSIS — Z0000 Encounter for general adult medical examination without abnormal findings: Secondary | ICD-10-CM | POA: Diagnosis not present
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
## 2023-07-24 NOTE — Patient Instructions (Signed)
 Please continue all other medications as before, and refills have been done if requested.  Please have the pharmacy call with any other refills you may need.  Please continue your efforts at being more active, low cholesterol diet, and weight control.  You are otherwise up to date with prevention measures today.  Please keep your appointments with your specialists as you may have planned - Endocrinology  Your lab work was Good today  Please make an Appointment to return in 6 months, or sooner if needed

## 2023-07-27 ENCOUNTER — Encounter: Payer: Self-pay | Admitting: Internal Medicine

## 2023-07-27 DIAGNOSIS — I951 Orthostatic hypotension: Secondary | ICD-10-CM | POA: Insufficient documentation

## 2023-07-27 DIAGNOSIS — E538 Deficiency of other specified B group vitamins: Secondary | ICD-10-CM | POA: Insufficient documentation

## 2023-07-27 DIAGNOSIS — I959 Hypotension, unspecified: Secondary | ICD-10-CM | POA: Insufficient documentation

## 2023-07-27 NOTE — Assessment & Plan Note (Signed)
 Recent onset, etiology unclear, pt to f/u endo r/o adrenal insufficiency,continue midodrine

## 2023-07-27 NOTE — Assessment & Plan Note (Signed)
 Lab Results  Component Value Date   CREATININE 1.39 07/10/2023   Stable overall, cont to avoid nephrotoxins

## 2023-07-27 NOTE — Assessment & Plan Note (Signed)
 Overcontrolled, ok to reduce b12 to 3 times weekly

## 2023-07-27 NOTE — Assessment & Plan Note (Signed)
 Age and sex appropriate education and counseling updated with regular exercise and diet Referrals for preventative services - for colonoscopy after dec 2025 Immunizations addressed - none needed Smoking counseling  - none needed Evidence for depression or other mood disorder - none significant Most recent labs reviewed. I have personally reviewed and have noted: 1) the patient's medical and social history 2) The patient's current medications and supplements 3) The patient's height, weight, and BMI have been recorded in the chart

## 2023-07-27 NOTE — Assessment & Plan Note (Signed)
 Lab Results  Component Value Date   HGBA1C 6.6 (H) 07/10/2023   Stable, pt to continue current medical treatment  - diet, wt control

## 2023-07-27 NOTE — Assessment & Plan Note (Signed)
 Last vitamin D Lab Results  Component Value Date   VD25OH 36.03 07/10/2023   Low, to start oral replacement

## 2023-07-30 ENCOUNTER — Other Ambulatory Visit: Payer: Self-pay | Admitting: Internal Medicine

## 2023-07-30 ENCOUNTER — Ambulatory Visit: Payer: Federal, State, Local not specified - PPO | Admitting: Internal Medicine

## 2023-07-30 ENCOUNTER — Ambulatory Visit: Payer: Self-pay | Admitting: Internal Medicine

## 2023-07-30 MED ORDER — DIPHENOXYLATE-ATROPINE 2.5-0.025 MG PO TABS: 1.0000 | ORAL_TABLET | Freq: Four times a day (QID) | ORAL | 0 refills | Status: DC | PRN

## 2023-07-30 MED ORDER — DIPHENOXYLATE-ATROPINE 2.5-0.025 MG PO TABS: ORAL_TABLET | ORAL | 1 refills | Status: DC

## 2023-07-30 MED ORDER — ONDANSETRON 4 MG PO TBDP: 4.0000 mg | ORAL_TABLET | Freq: Three times a day (TID) | ORAL | 0 refills | Status: DC | PRN

## 2023-07-30 MED ORDER — DIPHENOXYLATE-ATROPINE 2.5-0.025 MG PO TABS: 1.0000 | ORAL_TABLET | Freq: Four times a day (QID) | ORAL | 0 refills | Status: AC | PRN

## 2023-07-30 NOTE — Telephone Encounter (Signed)
 Ok for lomotil prn, and zofran odt - done erx

## 2023-07-30 NOTE — Telephone Encounter (Addendum)
 Reason for Disposition  [1] SEVERE diarrhea (e.g., 7 or more times / day more than normal) AND [2] age > 60 years  Answer Assessment - Initial Assessment Questions 1. DIARRHEA SEVERITY: "How bad is the diarrhea?" "How many more stools have you had in the past 24 hours than normal?"    - NO DIARRHEA (SCALE 0)   - MILD (SCALE 1-3): Few loose or mushy BMs; increase of 1-3 stools over normal daily number of stools; mild increase in ostomy output.   -  MODERATE (SCALE 4-7): Increase of 4-6 stools daily over normal; moderate increase in ostomy output.   -  SEVERE (SCALE 8-10; OR "WORST POSSIBLE"): Increase of 7 or more stools daily over normal; moderate increase in ostomy output; inconLive in [facility] assisted living.   My husband has terrible vomiting and diarrhea.   His started last night.     Vomiting and diarrhea real bad.     I was sick too last week. 2. ONSET: "When did the diarrhea begin?"      Lsast night 3. BM CONSISTENCY: "How loose or watery is the diarrhea?"      watery 4. VOMITING: "Are you also vomiting?" If Yes, ask: "How many times in the past 24 hours?"      Yes There's no way to get in.   I'm so weak I can't drive to bring him in.    5. ABDOMEN PAIN: "Are you having any abdomen pain?" If Yes, ask: "What does it feel like?" (e.g., crampy, dull, intermittent, constant)      I can hear hi vomiting in the background. No abd pain 6. ABDOMEN PAIN SEVERITY: If present, ask: "How bad is the pain?"  (e.g., Scale 1-10; mild, moderate, or severe)   - MILD (1-3): doesn't interfere with normal activities, abdomen soft and not tender to touch    - MODERATE (4-7): interferes with normal activities or awakens from sleep, abdomen tender to touch    - SEVERE (8-10): excruciating pain, doubled over, unable to do any normal activities       Severe 7. ORAL INTAKE: If vomiting, "Have you been able to drink liquids?" "How much liquids have you had in the past 24 hours?"     Can't get anything  down.  8. HYDRATION: "Any signs of dehydration?" (e.g., dry mouth [not just dry lips], too weak to stand, dizziness, new weight loss) "When did you last urinate?"     Not asked 9. EXPOSURE: "Have you traveled to a foreign country recently?" "Have you been exposed to anyone with diarrhea?" "Could you have eaten any food that was spoiled?"     Yes it's going around the facility where we live. 10. ANTIBIOTIC USE: "Are you taking antibiotics now or have you taken antibiotics in the past 2 months?"       Not asked 11. OTHER SYMPTOMS: "Do you have any other symptoms?" (e.g., fever, blood in stool)       Vomiting and diarrhea. 12. PREGNANCY: "Is there any chance you are pregnant?" "When was your last menstrual period?"       N/A  Protocols used: Diarrhea-A-AH  Chief Complaint: Vomiting and diarrhea  (wife called in)  They live in a retirement community and vomiting and diarrhea are going around real bad.   Wife was sick too but now taking clear liquids.   He is vomiting non stop in the background.   Requesting something be called in for the vomiting and diarrhea.  She is too weak to drive in since she has been sick with it too and there's no way he can leave the bathroom.   They don't know how to do video calls.   Symptoms: vomiting and diarrhea. Frequency: since last night Pertinent Negatives: Patient denies being able to keep anything down Disposition: [] ED /[] Urgent Care (no appt availability in office) / [x] Appointment(In office/virtual)/ []  [facility]/ [] Home Care/ [] Refused Recommended Disposition /[] [facility]/ [x]  Follow-up with PCP Additional Notes: Wife, Alvera Singh can be reached at [phone].  Since there is no way they can get into the office and do not know how to do video calls would Dr. Jonny Ruiz be willing to call something into the [facility]. For the vomiting and diarrhea.   Especially the vomiting.  (He is vomiting non stop in the  background).      I called into [facility] and spoke with Eran.   She has sent the information to the clinical team for Dr. Jonny Ruiz.

## 2023-07-30 NOTE — Telephone Encounter (Signed)
 This RN second attempt to contact patient for triage. No answer, voicemail left requesting return call to clinic.

## 2023-07-30 NOTE — Addendum Note (Signed)
 Addended by: Corwin Levins on: 07/30/2023 04:57 PM   Modules accepted: Orders

## 2023-07-30 NOTE — Telephone Encounter (Signed)
 Message from [place] R sent at 07/30/2023 10:43 AM EST  Copied From CRM [id number]. Reason for Triage: Diarrhea and vomiting - Stomach bug going around independent living facility.  Please call patients spouse Alvera Singh at [phone]  Called wife and LM on her VM to call back.

## 2023-07-31 ENCOUNTER — Encounter: Payer: Self-pay | Admitting: Internal Medicine

## 2023-07-31 ENCOUNTER — Other Ambulatory Visit: Payer: Self-pay

## 2023-07-31 MED ORDER — ROSUVASTATIN CALCIUM 40 MG PO TABS: 40.0000 mg | ORAL_TABLET | Freq: Every day | ORAL | 3 refills | Status: AC

## 2023-08-01 NOTE — Telephone Encounter (Signed)
 Called and let Pt wife know.

## 2023-08-03 ENCOUNTER — Encounter: Payer: Self-pay | Admitting: Internal Medicine

## 2023-08-05 ENCOUNTER — Ambulatory Visit (INDEPENDENT_AMBULATORY_CARE_PROVIDER_SITE_OTHER): Payer: Medicare Other

## 2023-08-05 VITALS — BP 120/80 | HR 94 | Ht 70.0 in | Wt 247.8 lb

## 2023-08-05 DIAGNOSIS — Z0000 Encounter for general adult medical examination without abnormal findings: Secondary | ICD-10-CM

## 2023-08-05 NOTE — Patient Instructions (Addendum)
 Alex Kemp , Thank you for taking time to come for your Medicare Wellness Visit. I appreciate your ongoing commitment to your health goals. Please review the following plan we discussed and let me know if I can assist you in the future.   Referrals/Orders/Follow-Ups/Clinician Recommendations: Aim for 30 minutes of exercise or brisk walking, 6-8 glasses of water, and 5 servings of fruits and vegetables each day.   This is a list of the screening recommended for you and due dates:  Health Maintenance  Topic Date Due   Colon Cancer Screening  05/16/2024   Medicare Annual Wellness Visit  08/04/2024   DTaP/Tdap/Td vaccine (4 - Td or Tdap) 01/07/2029   Pneumonia Vaccine  Completed   Flu Shot  Completed   COVID-19 Vaccine  Completed   Zoster (Shingles) Vaccine  Completed   HPV Vaccine  Aged Out   Hepatitis C Screening  Discontinued    Advanced directives: (In Chart) A copy of your advanced directives are scanned into your chart should your provider ever need it.  Next Medicare Annual Wellness Visit scheduled for next year: Yes
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
## 2023-08-08 ENCOUNTER — Ambulatory Visit (INDEPENDENT_AMBULATORY_CARE_PROVIDER_SITE_OTHER): Payer: Medicare Other | Admitting: Endocrinology

## 2023-08-08 ENCOUNTER — Encounter: Payer: Self-pay | Admitting: Endocrinology

## 2023-08-08 ENCOUNTER — Other Ambulatory Visit: Payer: Self-pay

## 2023-08-08 VITALS — BP 70/52 | HR 60 | Resp 20 | Ht 70.0 in | Wt 248.4 lb

## 2023-08-08 DIAGNOSIS — I951 Orthostatic hypotension: Secondary | ICD-10-CM

## 2023-08-08 DIAGNOSIS — I959 Hypotension, unspecified: Secondary | ICD-10-CM | POA: Diagnosis not present

## 2023-08-08 DIAGNOSIS — M6281 Muscle weakness (generalized): Secondary | ICD-10-CM | POA: Diagnosis not present

## 2023-08-08 DIAGNOSIS — R2689 Other abnormalities of gait and mobility: Secondary | ICD-10-CM | POA: Diagnosis not present

## 2023-08-08 NOTE — Progress Notes (Signed)
 Patient has extremely low B/P readings in b/l arms. Per patient he states that's an issue he has been having for the last year, but usually after sitting it would come up some. Patient is asymptomatic, no complaints of dizziness, blurred vision etc. RN will recheck after the visit is complete to see if it goes back up. MD made aware.    Rechecked after visit not much of a change. Patient admit to not taking his Midodrine this morning and commits to taking it as soon as gets home. Still asymptomatic.
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
## 2023-08-11 ENCOUNTER — Other Ambulatory Visit (INDEPENDENT_AMBULATORY_CARE_PROVIDER_SITE_OTHER): Payer: Self-pay

## 2023-08-11 ENCOUNTER — Telehealth: Payer: Self-pay | Admitting: Dietician

## 2023-08-11 DIAGNOSIS — M6281 Muscle weakness (generalized): Secondary | ICD-10-CM | POA: Diagnosis not present

## 2023-08-11 DIAGNOSIS — R2689 Other abnormalities of gait and mobility: Secondary | ICD-10-CM | POA: Diagnosis not present

## 2023-08-11 MED ORDER — RIVAROXABAN 20 MG PO TABS: 20.0000 mg | ORAL_TABLET | Freq: Every day | ORAL | 1 refills | Status: DC

## 2023-08-11 NOTE — Telephone Encounter (Signed)
 Returned patient call. He is to come tomorrow for a Cosyntropin test and asks if he should come fasting and if he should take his medications.  Instructed patient (per appointment instructions) that he should come fasting.  He states that he will bring his medication with him and not take them prior to the test.  Oran Rein, RD, LDN, CDCES, DipACLM

## 2023-08-12 ENCOUNTER — Encounter: Payer: Self-pay | Admitting: Internal Medicine

## 2023-08-12 ENCOUNTER — Telehealth: Payer: Self-pay | Admitting: Cardiology

## 2023-08-12 ENCOUNTER — Ambulatory Visit (INDEPENDENT_AMBULATORY_CARE_PROVIDER_SITE_OTHER)

## 2023-08-12 ENCOUNTER — Other Ambulatory Visit

## 2023-08-12 DIAGNOSIS — I824Y1 Acute embolism and thrombosis of unspecified deep veins of right proximal lower extremity: Secondary | ICD-10-CM

## 2023-08-12 DIAGNOSIS — I959 Hypotension, unspecified: Secondary | ICD-10-CM

## 2023-08-12 DIAGNOSIS — I82491 Acute embolism and thrombosis of other specified deep vein of right lower extremity: Secondary | ICD-10-CM

## 2023-08-12 DIAGNOSIS — I951 Orthostatic hypotension: Secondary | ICD-10-CM

## 2023-08-12 MED ORDER — RIVAROXABAN 20 MG PO TABS: 20.0000 mg | ORAL_TABLET | Freq: Every day | ORAL | 1 refills | Status: DC

## 2023-08-12 MED ORDER — COSYNTROPIN 0.25 MG IJ SOLR
0.2500 mg | Freq: Once | INTRAMUSCULAR | Status: AC
Start: 2023-08-12 — End: 2023-08-12
  Administered 2023-08-12: 0.25 mg via INTRAVENOUS

## 2023-08-12 NOTE — Telephone Encounter (Signed)
 Prescription refill request for Xarelto received.  Indication: DVT

## 2023-08-12 NOTE — Progress Notes (Signed)
 After obtaining consent, and per orders of Dr. Erroll Luna, injection of Cortrosyn given by Tera Partridge. Patient instructed to remain in clinic for 20 minutes afterwards, and to report any adverse reaction to me immediately.

## 2023-08-12 NOTE — Telephone Encounter (Signed)
*  STAT* If patient is at the pharmacy, call can be transferred to refill team.   1. Which medications need to be refilled? (please list name of each medication and dose if known) rivaroxaban (XARELTO) 20 MG TABS tablet  2. Which pharmacy/location (including street and city if local pharmacy) is medication to be sent to? [facility], [place] to [facility]   3. Do they need a 30 day or 90 day supply? Alex Kemp

## 2023-08-13 LAB — ACTH STIMULATION, 3 SPECIMENS
Coritsol: 10.1 ug/dL
Cortisol: 21.7 ug/dL
Cortisol: 30.7 ug/dL
TIME 1: 813
TIME 3: 932
Time 2: 847

## 2023-08-14 ENCOUNTER — Encounter: Payer: Self-pay | Admitting: Endocrinology

## 2023-08-14 NOTE — Telephone Encounter (Signed)
 Midodrine is not for the cortisol problem.  This is mainly for the low blood pressure problem.  I would recommend to continue on midodrine, as per cardiology, this is something you can double check with her cardiology as well.

## 2023-08-14 NOTE — Progress Notes (Signed)
  ACTH stimulation test on March 18 , 2025: Baseline cortisol was 10.1 and cortisol response with cosyntropin in 30 minutes was 29.7 and 60 minutes was 30.7, appropriate and acceptable response.  Negative for adrenal insufficiency.  No glucocorticoid medication is required.  No routine endocrinology follow-up is required.  Encouraged to call our clinic with any questions.  Continue to follow-up with other medical providers.       Component Ref Range & Units (hover) 2 d ago  TIME 1 813  Coritsol 10.1  Time 2 847  Cortisol 21.7  TIME 3 932  Cortisol 30.7  MISCELLANEOUS

## 2023-08-15 DIAGNOSIS — M6281 Muscle weakness (generalized): Secondary | ICD-10-CM | POA: Diagnosis not present

## 2023-08-15 DIAGNOSIS — R2689 Other abnormalities of gait and mobility: Secondary | ICD-10-CM | POA: Diagnosis not present

## 2023-08-20 ENCOUNTER — Encounter: Payer: Self-pay | Admitting: Internal Medicine

## 2023-08-20 ENCOUNTER — Other Ambulatory Visit: Payer: Self-pay

## 2023-08-20 DIAGNOSIS — R2689 Other abnormalities of gait and mobility: Secondary | ICD-10-CM | POA: Diagnosis not present

## 2023-08-20 DIAGNOSIS — M6281 Muscle weakness (generalized): Secondary | ICD-10-CM | POA: Diagnosis not present

## 2023-08-20 MED ORDER — ALLOPURINOL 100 MG PO TABS: 100.0000 mg | ORAL_TABLET | Freq: Every day | ORAL | 1 refills | Status: DC

## 2023-08-22 DIAGNOSIS — R2689 Other abnormalities of gait and mobility: Secondary | ICD-10-CM | POA: Diagnosis not present

## 2023-08-22 DIAGNOSIS — M6281 Muscle weakness (generalized): Secondary | ICD-10-CM | POA: Diagnosis not present

## 2023-08-26 ENCOUNTER — Other Ambulatory Visit (HOSPITAL_COMMUNITY): Payer: Self-pay

## 2023-08-27 ENCOUNTER — Encounter: Payer: Self-pay | Admitting: Internal Medicine

## 2023-08-27 ENCOUNTER — Ambulatory Visit (INDEPENDENT_AMBULATORY_CARE_PROVIDER_SITE_OTHER): Payer: Federal, State, Local not specified - PPO | Admitting: Internal Medicine

## 2023-08-27 VITALS — BP 122/60 | HR 114 | Ht 72.0 in | Wt 249.8 lb

## 2023-08-27 DIAGNOSIS — R2689 Other abnormalities of gait and mobility: Secondary | ICD-10-CM | POA: Diagnosis not present

## 2023-08-27 DIAGNOSIS — R0602 Shortness of breath: Secondary | ICD-10-CM

## 2023-08-27 DIAGNOSIS — Z87891 Personal history of nicotine dependence: Secondary | ICD-10-CM | POA: Diagnosis not present

## 2023-08-27 DIAGNOSIS — M6281 Muscle weakness (generalized): Secondary | ICD-10-CM | POA: Diagnosis not present

## 2023-08-27 DIAGNOSIS — I493 Ventricular premature depolarization: Secondary | ICD-10-CM | POA: Diagnosis not present

## 2023-08-27 MED ORDER — LEVALBUTEROL TARTRATE 45 MCG/ACT IN AERO
2.0000 | INHALATION_SPRAY | Freq: Four times a day (QID) | RESPIRATORY_TRACT | 12 refills | Status: AC | PRN
Start: 2023-08-27 — End: ?
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
## 2023-08-27 NOTE — Patient Instructions (Addendum)
 It was a pleasure to see you today!  Please schedule follow up scheduled with myself in 6 months.  If my schedule is not open yet, we will contact you with a reminder closer to that time. Please call [phone] if you haven't heard from [place] a month before, and always call us sooner if issues or concerns arise. You can also send [place] a message through MyChart, but but aware that this is not to be used for urgent issues and it may take up to 5-7 days to receive a reply. Please be aware that you will likely be able to view your results before I have a chance to respond to them. Please give [place] 5 business days to respond to any non-urgent results.    Glad your breathing is doing a little better on the inhaler.  Continue Trelegy 1 puff once daily.  Fine to continue this as long as you perceive some benefit.  Your breathing testing was totally normal although it did show some benefit to bronchodilator medications like albuterol.  Since albuterol can make your heart rate fast I am recommending a medication called levalbuterol which will give you all the benefits without the side effect of fast heart rate.  I suspect most of your symptoms of shortness of breath, dizziness, weakness are more related to your heart and abnormal heart rate and blood pressure.  Make sure you stay hydrated and eat and drink at regular intervals.  Do not miss any doses of your midodrine as it appears your blood pressure is very sensitive to missing this.  Agree with compression stockings etc.  Your heart rate and blood pressure problems could be related to a condition called dysautonomia.  Here is a website below for more information.  [email or web address]

## 2023-08-29 DIAGNOSIS — M6281 Muscle weakness (generalized): Secondary | ICD-10-CM | POA: Diagnosis not present

## 2023-08-29 DIAGNOSIS — R2689 Other abnormalities of gait and mobility: Secondary | ICD-10-CM | POA: Diagnosis not present

## 2023-09-01 DIAGNOSIS — M6281 Muscle weakness (generalized): Secondary | ICD-10-CM | POA: Diagnosis not present

## 2023-09-01 DIAGNOSIS — R2689 Other abnormalities of gait and mobility: Secondary | ICD-10-CM | POA: Diagnosis not present

## 2023-09-03 ENCOUNTER — Other Ambulatory Visit: Payer: Self-pay | Admitting: Internal Medicine

## 2023-09-03 ENCOUNTER — Ambulatory Visit: Attending: Internal Medicine | Admitting: Internal Medicine

## 2023-09-03 VITALS — BP 120/80 | HR 95 | Ht 72.0 in | Wt 248.2 lb

## 2023-09-03 DIAGNOSIS — M6281 Muscle weakness (generalized): Secondary | ICD-10-CM | POA: Diagnosis not present

## 2023-09-03 DIAGNOSIS — R2689 Other abnormalities of gait and mobility: Secondary | ICD-10-CM | POA: Diagnosis not present

## 2023-09-03 DIAGNOSIS — G901 Familial dysautonomia [Riley-Day]: Secondary | ICD-10-CM | POA: Insufficient documentation

## 2023-09-03 MED ORDER — EZETIMIBE 10 MG PO TABS: 10.0000 mg | ORAL_TABLET | Freq: Every day | ORAL | 3 refills | Status: DC

## 2023-09-03 NOTE — Patient Instructions (Signed)
 Medication Instructions:  Your physician recommends that you continue on your current medications as directed. Please refer to the Current Medication list given to you today.  *If you need a refill on your cardiac medications before your next appointment, please call your pharmacy*  Follow-Up: At [facility], you and your health needs are our priority.  As part of our continuing mission to provide you with exceptional heart care, our providers are all part of one team.  This team includes your primary Cardiologist (physician) and Advanced Practice Providers or APPs (Physician Assistants and Nurse Practitioners) who all work together to provide you with the care you need, when you need it.  Your next appointment:   6 month(s)  Provider:   Dr. Herbie Baltimore  Other Instructions    1st Floor: - Lobby - Registration  - Pharmacy  - Lab - Cafe  2nd Floor: - PV Lab - Diagnostic Testing (echo, CT, nuclear med)  3rd Floor: - Vacant  4th Floor: - TCTS (cardiothoracic surgery) - AFib Clinic - Structural Heart Clinic - Vascular Surgery  - Vascular Ultrasound  5th Floor: - [facility] Cardiology (general and EP) - Clinical Pharmacy for coumadin, hypertension, lipid, weight-loss medications, and med management appointments    Valet parking services will be available as well.

## 2023-09-03 NOTE — Progress Notes (Signed)
 Cardiology Office Note:    Date:  09/03/2023   ID:  Alex Kemp, DOB 1940/05/29, MRN [id number]  PCP:  Corwin Levins, MD   [facility] Providers Cardiologist:  Maisie Fus, MD     Referring MD: Corwin Levins, MD   No chief complaint on file. PE  History of Present Illness:    Alex Kemp is a 83 y.o. male with a hx of below, he was an [employer] and he fell. He then fell in 02/22/2022.  He came to the ED with SOB. had DVT/PE found 05/14/2022, echo 07/2021-showed normal LV fxn, RV is only mildly enlarged, no structural heart dx, which can be seen with aging. He went to the ED found to have DVT 12/19 and PE. No evidence of cardiac strain. EKG shows NSR, 1st degree AV block, frequent PVCs.  RVOT PVCs.He was started on [facility]. Noted was planned to get admitted. He was seen by vascular surgery for consideration of thrombectomy, determined not a good candidate. He stated that he was told he did not not require admission by vascular sx. He was recommended to take xarelto. Cardiology referral was made for? Notes " on patient's behalf".   His CT scan showed 3 vessel dx.  He has hx of PAD s/p PCI left superficial femoral artery and PCI L posterior tibial artery 08/15/2020. He's on plavix as well as aspirin for PAD.   No recent echo  Notes Bp fluctutate high and low. He can't walk far.   CT 05/13/2022 1. Focus of nonocclusive embolus within the distal right pulmonary artery as well as bandlike embolus in the right lower lobe pulmonary artery. This may be nonacute given appearance, although pulmonary embolus is strictly age indeterminate by CT. 2. Trace bilateral pleural effusions. 3. Mild, diffuse bilateral bronchial wall thickening, consistent with nonspecific infectious or inflammatory bronchitis. 4. Global cardiomegaly and coronary artery disease.  Interim 09/23/2022 Recommended to return by his PCP Dr. Jonny Ruiz for SOB.  Received albuterol. Unclear if asthma. He notes this helps.  He was  started on lasix. Stopped 2/2 frequent urination. Then restarted. Weights stable 255 in Jan., today 253. He notes getting hot flashes with sitting.  He denies angina, lower extremity edema, PND or orthopnea.   Interim hx 03/25/2923 His BP was low, so lasix was stopped. He transitioned to assisted living; he lives down the road from his prior house. BP is low today. He is asymptomatic. He takes cortisone shots; he's only had one  Interim hx 06/19/2023 He lives in assisted living with his wife.  They are very active and participate in different activities.  His BP is still low. Notes that he is feeling some LH. His weight is getting lower. He notes not eating as much. He continues to have dyspnea. He is planned for PFTs.    Interim hx 09/03/2023 [name] is doing well.  He continues to enjoy community living.  He recently had cataract surgery and this went well.  His hypotension has improved on midodrine.  He saw pulmonology and was started on Trelegy.  He was also recommended to take levalbuterol to keep his heart rates down.  He denies any chest pain.  No worsening shortness of breath.   Current Medications: Current Outpatient Medications on File Prior to Visit  Medication Sig Dispense Refill   alfuzosin (UROXATRAL) 10 MG 24 hr tablet Take 1 tablet (10 mg total) by mouth daily. 90 tablet 3   allopurinol (ZYLOPRIM) 100 MG tablet Take  1 tablet (100 mg total) by mouth daily. 90 tablet 1   Ascorbic Acid (VITAMIN C PO) Take 1,000 mg by mouth daily at 6 (six) AM.     b complex vitamins tablet Take 1 tablet by mouth daily with lunch.     cetirizine (ZYRTEC) 10 MG tablet Take 10 mg by mouth daily.     Cholecalciferol (VITAMIN D) 50 MCG (2000 UT) tablet Take 2,000 Units by mouth daily.     cyanocobalamin 1000 MCG tablet Take 1,000 mcg by mouth daily.     diphenoxylate-atropine (LOMOTIL) 2.5-0.025 MG tablet Take 1 tablet by mouth 4 (four) times daily as needed for diarrhea or loose stools. 30 tablet 0    ezetimibe (ZETIA) 10 MG tablet Take 1 tablet (10 mg total) by mouth daily. 90 tablet 3   finasteride (PROSCAR) 5 MG tablet Take 5 mg by mouth daily.     Fluticasone-Umeclidin-Vilant (TRELEGY ELLIPTA) 100-62.5-25 MCG/ACT AEPB Inhale 1 puff into the lungs daily. 1 each 11   levalbuterol (XOPENEX HFA) 45 MCG/ACT inhaler Inhale 2 puffs into the lungs every 6 (six) hours as needed for wheezing. 15 g 12   midodrine (PROAMATINE) 5 MG tablet Take 1 tablet (5 mg total) by mouth 3 (three) times daily with meals. 90 tablet 3   Multiple Vitamin (MULTIVITAMIN WITH MINERALS) TABS tablet Take 1 tablet by mouth daily with lunch.     nitroGLYCERIN (NITROSTAT) 0.4 MG SL tablet Place 1 tablet (0.4 mg total) under the tongue every 5 (five) minutes as needed for chest pain. 90 tablet 3   pantoprazole (PROTONIX) 40 MG tablet TAKE 1 TABLET TWICE A DAY. (Patient taking differently: Take 40 mg by mouth daily.) 180 tablet 2   Polyvinyl Alcohol-Povidone (REFRESH OP) Place 1 drop into both eyes 2 (two) times daily.     rivaroxaban (XARELTO) 20 MG TABS tablet Take 1 tablet (20 mg total) by mouth daily with supper. 90 tablet 1   rOPINIRole (REQUIP) 1 MG tablet Take 1 tablet (1 mg total) by mouth at bedtime. 90 tablet 3   rosuvastatin (CRESTOR) 40 MG tablet Take 1 tablet (40 mg total) by mouth daily. 90 tablet 3   triamcinolone (NASACORT) 55 MCG/ACT AERO nasal inhaler Place 2 sprays into the nose daily. (Patient taking differently: Place 2 sprays into the nose in the morning.) 1 each 12   No current facility-administered medications on file prior to visit.     Allergies:   Penicillins   Social History   Socioeconomic History   Marital status: Significant Other    Spouse name: Not on file   Number of children: 1   Years of education: Not on file   Highest education level: Professional school degree (e.g., MD, DDS, DVM, JD)  Occupational History   Occupation: [employer] being flexible  Tobacco Use   Smoking status:  Former    Current packs/day: 0.00    Average packs/day: 2.0 packs/day for 20.0 years (40.0 ttl pk-yrs)    Types: Cigarettes    Start date: 12/26/1958    Quit date: 12/26/1978    Years since quitting: 44.7    Passive exposure: Never   Smokeless tobacco: Never  Vaping Use   Vaping status: Never Used  Substance and Sexual Activity   Alcohol use: Yes    Comment: 04/23/2017 "couple glasses of wine/month"   Drug use: Never   Sexual activity: Yes    Birth control/protection: None  Other Topics Concern   Not on file  Social History  Narrative   Not married - has a Chief of Staff Strain: Low Risk  (08/05/2023)   Overall Financial Resource Strain (CARDIA)    Difficulty of Paying Living Expenses: Not hard at all  Food Insecurity: No Food Insecurity (08/05/2023)   Hunger Vital Sign    Worried About Running Out of Food in the Last Year: Never true    Ran Out of Food in the Last Year: Never true  Transportation Needs: No Transportation Needs (08/05/2023)   PRAPARE - Administrator, Civil Service (Medical): No    Lack of Transportation (Non-Medical): No  Physical Activity: Inactive (08/05/2023)   Exercise Vital Sign    Days of Exercise per Week: 0 days    Minutes of Exercise per Session: 0 min  Stress: No Stress Concern Present (08/05/2023)   [employer] of Occupational Health - Occupational Stress Questionnaire    Feeling of Stress : Not at all  Social Connections: Moderately Isolated (08/05/2023)   Social Connection and Isolation Panel [NHANES]    Frequency of Communication with Friends and Family: More than three times a week    Frequency of Social Gatherings with Friends and Family: Once a week    Attends Religious Services: Never    Database administrator or Organizations: No    Attends Engineer, structural: Never    Marital Status: Living with partner     Family History: The patient's family history includes Arthritis  in an other family member. There is no history of Lung disease.  ROS:   Please see the history of present illness.     All other systems reviewed and are negative.  EKGs/Labs/Other Studies Reviewed:    The following studies were reviewed today:   EKG:  EKG is  ordered today.  The ekg ordered today demonstrates   05/28/2022- NSR with 1st degree AV block with PACs, Qtc 554 ms  03/25/2023- sinus rhythm HR 61 bpm, 1st degree AV block  TTE  07/26/2021- EF 55-60%, normal RV fxn, RV mildly enlarged, mild MR  TTE 06/25/2022-  EF 50-55%, RV fxn noted mild reduction, mild MR    Coronary CTA 09/27/2022 IMPRESSION: 1. Coronary calcium score of 1796. This was 82nd percentile for age-, sex, and race-matched controls.   2. Normal coronary origin with right dominance.   3. Mild ostial left main stenosis (25-49%).   4. Severe proximal to mid LAD stenosis (70-99%).   5. Severe proximal LCX stenosis (70-99%).   6. Severe distal LCX/OM2 lesion (70-99%).   7. Moderate plaque in the proximal and mid RCA (50-69%).   8. Dilated pulmonary artery suggestive of pulmonary hypertension.   RECOMMENDATIONS: 1. Would recommend cardiac catheterization given concerns for 3-vessel CAD. Consider symptom-guided anti-ischemic pharmacotherapy as well as risk factor modification per guideline directed care. Invasive coronary angiography recommended with revascularization per published guideline statements.    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 Borderline positive, albeit not favorable for PCI as it will likely require crossing to diagonal branches. Recommend medical therapy. Proximal LCx eccentric 55 to 60% stenosis with RFR of 0.97, and mild to moderate diffuse to disease in the mid LCx-OM 2 Mild to  moderate diffuse disease ranging from 30 to 50% throughout the  proximal to mid RCA-not flow-limiting.   Mild to moderately elevated LVEDP of 19 mmHg in the setting of systemic hypertension Preserved LVEF by LV gram although difficult to assess wall motion.    RECOMMENDATIONS Based on the location of the borderline positive RFR lesion in the LAD, not very favorable for PCI due to necessity to cross 2 diagonal branches.  Recommend medical management for now. Would be reluctant to place stent in the LAD if there is no guarantee of improved symptoms, especially with the patient already being on DOAC for PEs. Would recommend titrating medical management in order to treat symptoms while on DOAC.  Can reassess symptomatology once off DOAC and if still having concerning symptoms, we could consider PCI at that time which would then avoid antiplatelet plus antithrombotic agent. I have added valsartan 40 mg daily for additional blood pressure control /afterload reduction given hypertension and elevated LVEDP. Will defer to primary cardiologist for initiation of antianginal therapy.  Recent Labs: 09/12/2022: Pro B Natriuretic peptide (BNP) 210.0 07/10/2023: ALT 16; BUN 18; Creatinine, Ser 1.39; Hemoglobin 13.2; Platelets 221.0; Potassium 4.4; Sodium 141; TSH 2.92   Recent Lipid Panel    Component Value Date/Time   CHOL 133 07/10/2023 0950   TRIG 83.0 07/10/2023 0950   HDL 64.20 07/10/2023 0950   CHOLHDL 2 07/10/2023 0950   VLDL 16.6 07/10/2023 0950   LDLCALC 52 07/10/2023 0950   LDLDIRECT 75.0 01/18/2022 1506     Risk Assessment/Calculations:     Physical Exam:    VS:   Vitals:   09/03/23 1501  BP: 120/80  Pulse: 95     Wt Readings from Last 3 Encounters:  08/27/23 249 lb 12.8 oz (113.3 kg)  08/08/23 248 lb 6.4 oz (112.7 kg)  08/05/23 247 lb 12.8 oz (112.4 kg)     GEN:  Well nourished, well developed in no acute distress HEENT: Normal NECK: No JVD; No carotid bruits LYMPHATICS:  No lymphadenopathy CARDIAC: RRR, no murmurs, rubs, gallops RESPIRATORY:  Clear to auscultation without rales, wheezing or rhonchi  ABDOMEN: Soft, non-tender, non-distended MUSCULOSKELETAL:  No edema; No deformity  SKIN: Warm and dry NEUROLOGIC:  Alert and oriented x 3 PSYCHIATRIC:  Normal affect   ASSESSMENT:    Chronic SOB: Stable.  Multifactorial.  He has had history of PE, but no persistent pulmonary hypertension.  He has coronary artery disease which is stable and no lesions were amenable for PCI.  He has mild to moderate PVC burden.  No heart failure symptoms. He had PFTs with borderline response to bronchodilator therapy.  He was prescribed Trelegy which helps mildly. Change levalbuterol to help with rates   3V CVD/CAC Stable. 1796; 82nd percentile. LHC 10/04/2022-showed borderline positive flow reduction mid to distal LAD.  PCI was deferred because this could jail his diagonal.  LDL 52 mg/dL.  Will medically manage.  On Xarelto  PVCs stable PVCs 6.4% burden.  BP too low for BB. Will continue to monitor. Can be ischemic  Dysautonomia improved He has had persistent hypotension with systolic blood pressures in the 80s.  He was asymptomatic from this.  He has had an extensive workup below.  -BP was still low despite stopping beta-blocker and Lasix; and prior anti-hypertensive.  Not in cardiogenic shock. -Negative workup for adrenal disease -continue midodrine 5 mg 3 times daily -compression stockings   Provoked PE:  On xarelto.     PLAN:    In order of problems listed above:   Follow up in 6 months  with Dr. Herbie Baltimore   Medication Adjustments/Labs and Tests Ordered: Current medicines are reviewed at length with the patient today.  Concerns regarding medicines are outlined above.  No orders of the defined types were placed in this encounter.  No orders of the defined types were placed in this encounter.   There are no Patient Instructions on file for this visit.    Signed, Maisie Fus, MD  09/03/2023 2:47 PM    [facility]

## 2023-09-05 DIAGNOSIS — R2689 Other abnormalities of gait and mobility: Secondary | ICD-10-CM | POA: Diagnosis not present

## 2023-09-05 DIAGNOSIS — M6281 Muscle weakness (generalized): Secondary | ICD-10-CM | POA: Diagnosis not present

## 2023-09-08 DIAGNOSIS — M6281 Muscle weakness (generalized): Secondary | ICD-10-CM | POA: Diagnosis not present

## 2023-09-08 DIAGNOSIS — R2689 Other abnormalities of gait and mobility: Secondary | ICD-10-CM | POA: Diagnosis not present

## 2023-09-10 DIAGNOSIS — R2689 Other abnormalities of gait and mobility: Secondary | ICD-10-CM | POA: Diagnosis not present

## 2023-09-10 DIAGNOSIS — M6281 Muscle weakness (generalized): Secondary | ICD-10-CM | POA: Diagnosis not present

## 2023-09-12 DIAGNOSIS — R2689 Other abnormalities of gait and mobility: Secondary | ICD-10-CM | POA: Diagnosis not present

## 2023-09-12 DIAGNOSIS — M6281 Muscle weakness (generalized): Secondary | ICD-10-CM | POA: Diagnosis not present

## 2023-09-13 ENCOUNTER — Other Ambulatory Visit: Payer: Self-pay | Admitting: Internal Medicine

## 2023-09-13 DIAGNOSIS — I10 Essential (primary) hypertension: Secondary | ICD-10-CM

## 2023-09-13 DIAGNOSIS — I739 Peripheral vascular disease, unspecified: Secondary | ICD-10-CM

## 2023-09-15 ENCOUNTER — Other Ambulatory Visit: Payer: Self-pay

## 2023-09-15 ENCOUNTER — Encounter: Payer: Self-pay | Admitting: Internal Medicine

## 2023-09-15 DIAGNOSIS — M6281 Muscle weakness (generalized): Secondary | ICD-10-CM | POA: Diagnosis not present

## 2023-09-15 DIAGNOSIS — I10 Essential (primary) hypertension: Secondary | ICD-10-CM

## 2023-09-15 DIAGNOSIS — I739 Peripheral vascular disease, unspecified: Secondary | ICD-10-CM

## 2023-09-15 DIAGNOSIS — R2689 Other abnormalities of gait and mobility: Secondary | ICD-10-CM | POA: Diagnosis not present

## 2023-09-16 MED ORDER — MIDODRINE HCL 5 MG PO TABS: 5.0000 mg | ORAL_TABLET | Freq: Three times a day (TID) | ORAL | 1 refills | Status: DC

## 2023-09-16 NOTE — Addendum Note (Signed)
 Addended by: Roslyn Coombe on: 09/16/2023 09:42 AM   Modules accepted: Orders

## 2023-09-17 DIAGNOSIS — R2689 Other abnormalities of gait and mobility: Secondary | ICD-10-CM | POA: Diagnosis not present

## 2023-09-17 DIAGNOSIS — M6281 Muscle weakness (generalized): Secondary | ICD-10-CM | POA: Diagnosis not present

## 2023-09-19 DIAGNOSIS — M6281 Muscle weakness (generalized): Secondary | ICD-10-CM | POA: Diagnosis not present

## 2023-09-19 DIAGNOSIS — R2689 Other abnormalities of gait and mobility: Secondary | ICD-10-CM | POA: Diagnosis not present

## 2023-09-22 DIAGNOSIS — M6281 Muscle weakness (generalized): Secondary | ICD-10-CM | POA: Diagnosis not present

## 2023-09-22 DIAGNOSIS — R2689 Other abnormalities of gait and mobility: Secondary | ICD-10-CM | POA: Diagnosis not present

## 2023-09-26 DIAGNOSIS — R2689 Other abnormalities of gait and mobility: Secondary | ICD-10-CM | POA: Diagnosis not present

## 2023-09-26 DIAGNOSIS — M6281 Muscle weakness (generalized): Secondary | ICD-10-CM | POA: Diagnosis not present

## 2023-09-29 DIAGNOSIS — M6281 Muscle weakness (generalized): Secondary | ICD-10-CM | POA: Diagnosis not present

## 2023-09-29 DIAGNOSIS — R2689 Other abnormalities of gait and mobility: Secondary | ICD-10-CM | POA: Diagnosis not present

## 2023-10-13 DIAGNOSIS — M6281 Muscle weakness (generalized): Secondary | ICD-10-CM | POA: Diagnosis not present

## 2023-10-13 DIAGNOSIS — R2689 Other abnormalities of gait and mobility: Secondary | ICD-10-CM | POA: Diagnosis not present

## 2023-10-14 ENCOUNTER — Encounter (INDEPENDENT_AMBULATORY_CARE_PROVIDER_SITE_OTHER): Payer: Self-pay

## 2023-10-18 ENCOUNTER — Encounter: Payer: Self-pay | Admitting: Internal Medicine

## 2023-10-20 DIAGNOSIS — M6281 Muscle weakness (generalized): Secondary | ICD-10-CM | POA: Diagnosis not present

## 2023-10-20 DIAGNOSIS — R2689 Other abnormalities of gait and mobility: Secondary | ICD-10-CM | POA: Diagnosis not present

## 2023-10-21 ENCOUNTER — Other Ambulatory Visit: Payer: Self-pay

## 2023-10-21 MED ORDER — ROPINIROLE HCL 1 MG PO TABS: 1.0000 mg | ORAL_TABLET | Freq: Every day | ORAL | 3 refills | Status: DC

## 2023-10-27 ENCOUNTER — Telehealth: Payer: Self-pay

## 2023-10-27 ENCOUNTER — Other Ambulatory Visit (HOSPITAL_COMMUNITY): Payer: Self-pay

## 2023-10-27 NOTE — Telephone Encounter (Signed)
 Pharmacy Patient Advocate Encounter  Received notification from [facility] that Prior Authorization for Pantoprazole  Sodium 40MG  dr tablets  has been APPROVED from 09/27/23 to 10/26/24. Unable to obtain price due to refill too soon rejection, last fill date 10/21/23 next available fill date06/30/25   PA #/Case ID/Reference #:  [id number]

## 2023-11-12 DIAGNOSIS — R2689 Other abnormalities of gait and mobility: Secondary | ICD-10-CM | POA: Diagnosis not present

## 2023-11-12 DIAGNOSIS — M6281 Muscle weakness (generalized): Secondary | ICD-10-CM | POA: Diagnosis not present

## 2023-11-17 DIAGNOSIS — R2689 Other abnormalities of gait and mobility: Secondary | ICD-10-CM | POA: Diagnosis not present

## 2023-11-17 DIAGNOSIS — M6281 Muscle weakness (generalized): Secondary | ICD-10-CM | POA: Diagnosis not present

## 2023-11-21 DIAGNOSIS — M6281 Muscle weakness (generalized): Secondary | ICD-10-CM | POA: Diagnosis not present

## 2023-11-21 DIAGNOSIS — R2689 Other abnormalities of gait and mobility: Secondary | ICD-10-CM | POA: Diagnosis not present

## 2023-12-01 ENCOUNTER — Ambulatory Visit (INDEPENDENT_AMBULATORY_CARE_PROVIDER_SITE_OTHER): Payer: Federal, State, Local not specified - PPO | Admitting: Vascular Surgery

## 2023-12-01 ENCOUNTER — Encounter (INDEPENDENT_AMBULATORY_CARE_PROVIDER_SITE_OTHER): Payer: Federal, State, Local not specified - PPO

## 2023-12-02 ENCOUNTER — Ambulatory Visit (INDEPENDENT_AMBULATORY_CARE_PROVIDER_SITE_OTHER)

## 2023-12-02 ENCOUNTER — Ambulatory Visit (INDEPENDENT_AMBULATORY_CARE_PROVIDER_SITE_OTHER): Admitting: Nurse Practitioner

## 2023-12-02 ENCOUNTER — Encounter (INDEPENDENT_AMBULATORY_CARE_PROVIDER_SITE_OTHER): Payer: Self-pay | Admitting: Nurse Practitioner

## 2023-12-02 VITALS — BP 126/75 | HR 91 | Resp 18 | Wt 245.2 lb

## 2023-12-02 DIAGNOSIS — I70213 Atherosclerosis of native arteries of extremities with intermittent claudication, bilateral legs: Secondary | ICD-10-CM | POA: Diagnosis not present

## 2023-12-02 DIAGNOSIS — I10 Essential (primary) hypertension: Secondary | ICD-10-CM | POA: Diagnosis not present

## 2023-12-02 DIAGNOSIS — E782 Mixed hyperlipidemia: Secondary | ICD-10-CM

## 2023-12-02 DIAGNOSIS — I739 Peripheral vascular disease, unspecified: Secondary | ICD-10-CM | POA: Diagnosis not present

## 2023-12-02 DIAGNOSIS — Z9889 Other specified postprocedural states: Secondary | ICD-10-CM
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
## 2023-12-03 DIAGNOSIS — R2689 Other abnormalities of gait and mobility: Secondary | ICD-10-CM | POA: Diagnosis not present

## 2023-12-03 DIAGNOSIS — M6281 Muscle weakness (generalized): Secondary | ICD-10-CM | POA: Diagnosis not present

## 2023-12-04 DIAGNOSIS — I129 Hypertensive chronic kidney disease with stage 1 through stage 4 chronic kidney disease, or unspecified chronic kidney disease: Secondary | ICD-10-CM | POA: Diagnosis not present

## 2023-12-04 DIAGNOSIS — N2581 Secondary hyperparathyroidism of renal origin: Secondary | ICD-10-CM | POA: Diagnosis not present

## 2023-12-04 DIAGNOSIS — N1832 Chronic kidney disease, stage 3b: Secondary | ICD-10-CM | POA: Diagnosis not present

## 2023-12-04 DIAGNOSIS — D631 Anemia in chronic kidney disease: Secondary | ICD-10-CM | POA: Diagnosis not present

## 2023-12-04 LAB — VAS US ABI WITH/WO TBI
Left ABI: 1.06
Right ABI: 1.05

## 2023-12-10 DIAGNOSIS — R2689 Other abnormalities of gait and mobility: Secondary | ICD-10-CM | POA: Diagnosis not present

## 2023-12-10 DIAGNOSIS — M6281 Muscle weakness (generalized): Secondary | ICD-10-CM | POA: Diagnosis not present

## 2023-12-22 DIAGNOSIS — R2689 Other abnormalities of gait and mobility: Secondary | ICD-10-CM | POA: Diagnosis not present

## 2023-12-22 DIAGNOSIS — M6281 Muscle weakness (generalized): Secondary | ICD-10-CM | POA: Diagnosis not present

## 2023-12-24 DIAGNOSIS — M6281 Muscle weakness (generalized): Secondary | ICD-10-CM | POA: Diagnosis not present

## 2023-12-24 DIAGNOSIS — R2689 Other abnormalities of gait and mobility: Secondary | ICD-10-CM | POA: Diagnosis not present

## 2023-12-26 DIAGNOSIS — M6281 Muscle weakness (generalized): Secondary | ICD-10-CM | POA: Diagnosis not present

## 2023-12-26 DIAGNOSIS — R2689 Other abnormalities of gait and mobility: Secondary | ICD-10-CM | POA: Diagnosis not present

## 2023-12-31 DIAGNOSIS — R2689 Other abnormalities of gait and mobility: Secondary | ICD-10-CM | POA: Diagnosis not present

## 2023-12-31 DIAGNOSIS — M6281 Muscle weakness (generalized): Secondary | ICD-10-CM | POA: Diagnosis not present

## 2024-01-01 ENCOUNTER — Encounter: Payer: Self-pay | Admitting: Internal Medicine

## 2024-01-01 ENCOUNTER — Ambulatory Visit (INDEPENDENT_AMBULATORY_CARE_PROVIDER_SITE_OTHER): Admitting: Internal Medicine

## 2024-01-01 VITALS — BP 138/86 | HR 104 | Temp 98.3°F | Ht 72.0 in | Wt 249.0 lb

## 2024-01-01 DIAGNOSIS — R0609 Other forms of dyspnea: Secondary | ICD-10-CM | POA: Diagnosis not present

## 2024-01-01 DIAGNOSIS — Z87891 Personal history of nicotine dependence: Secondary | ICD-10-CM | POA: Diagnosis not present

## 2024-01-01 DIAGNOSIS — R053 Chronic cough: Secondary | ICD-10-CM

## 2024-01-01 NOTE — Patient Instructions (Signed)
 It was a pleasure to see you today!  Please schedule follow up with myself in 1 year.  If my schedule is not open yet, we will contact you with a reminder closer to that time. Please call [phone] if you haven't heard from us  a month before, and always call us  sooner if issues or concerns arise. You can also send us  a message through MyChart, but but aware that this is not to be used for urgent issues and it may take up to 5-7 days to receive a reply. Please be aware that you will likely be able to view your results before I have a chance to respond to them. Please give us  5 business days to respond to any non-urgent results.    Glad your blood pressure and heart rate are doing better.  It's possible your cough is related to the trelegy inhaler. Try stopping it for a week to see if it improves symptoms. You can continue levalbuterol  only if it helps.   Most of your shortness of breath is likely related to your heart disease. Your pulmonary function testing is normal.  Call me sooner if needed.
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
## 2024-01-02 DIAGNOSIS — M6281 Muscle weakness (generalized): Secondary | ICD-10-CM | POA: Diagnosis not present

## 2024-01-02 DIAGNOSIS — R2689 Other abnormalities of gait and mobility: Secondary | ICD-10-CM | POA: Diagnosis not present

## 2024-01-05 DIAGNOSIS — M6281 Muscle weakness (generalized): Secondary | ICD-10-CM | POA: Diagnosis not present

## 2024-01-05 DIAGNOSIS — R2689 Other abnormalities of gait and mobility: Secondary | ICD-10-CM | POA: Diagnosis not present

## 2024-01-07 DIAGNOSIS — R2689 Other abnormalities of gait and mobility: Secondary | ICD-10-CM | POA: Diagnosis not present

## 2024-01-07 DIAGNOSIS — M6281 Muscle weakness (generalized): Secondary | ICD-10-CM | POA: Diagnosis not present

## 2024-01-14 DIAGNOSIS — M6281 Muscle weakness (generalized): Secondary | ICD-10-CM | POA: Diagnosis not present

## 2024-01-14 DIAGNOSIS — R2689 Other abnormalities of gait and mobility: Secondary | ICD-10-CM | POA: Diagnosis not present

## 2024-01-16 DIAGNOSIS — M6281 Muscle weakness (generalized): Secondary | ICD-10-CM | POA: Diagnosis not present

## 2024-01-16 DIAGNOSIS — R2689 Other abnormalities of gait and mobility: Secondary | ICD-10-CM | POA: Diagnosis not present

## 2024-01-19 DIAGNOSIS — R2689 Other abnormalities of gait and mobility: Secondary | ICD-10-CM | POA: Diagnosis not present

## 2024-01-19 DIAGNOSIS — M6281 Muscle weakness (generalized): Secondary | ICD-10-CM | POA: Diagnosis not present

## 2024-01-20 ENCOUNTER — Other Ambulatory Visit: Payer: Self-pay | Admitting: Internal Medicine

## 2024-01-21 ENCOUNTER — Ambulatory Visit: Payer: Medicare Other | Admitting: Internal Medicine

## 2024-01-21 ENCOUNTER — Ambulatory Visit: Payer: Self-pay | Admitting: Internal Medicine

## 2024-01-21 ENCOUNTER — Other Ambulatory Visit: Payer: Self-pay | Admitting: Internal Medicine

## 2024-01-21 VITALS — BP 102/70 | HR 75 | Temp 98.1°F | Ht 72.0 in | Wt 250.8 lb

## 2024-01-21 DIAGNOSIS — E559 Vitamin D deficiency, unspecified: Secondary | ICD-10-CM

## 2024-01-21 DIAGNOSIS — R202 Paresthesia of skin: Secondary | ICD-10-CM | POA: Diagnosis not present

## 2024-01-21 DIAGNOSIS — R7303 Prediabetes: Secondary | ICD-10-CM

## 2024-01-21 DIAGNOSIS — N1831 Chronic kidney disease, stage 3a: Secondary | ICD-10-CM

## 2024-01-21 DIAGNOSIS — E611 Iron deficiency: Secondary | ICD-10-CM

## 2024-01-21 DIAGNOSIS — I10 Essential (primary) hypertension: Secondary | ICD-10-CM | POA: Diagnosis not present

## 2024-01-21 DIAGNOSIS — D509 Iron deficiency anemia, unspecified: Secondary | ICD-10-CM

## 2024-01-21 DIAGNOSIS — E1142 Type 2 diabetes mellitus with diabetic polyneuropathy: Secondary | ICD-10-CM | POA: Diagnosis not present

## 2024-01-21 LAB — HEPATIC FUNCTION PANEL
ALT: 12 U/L (ref 0–53)
AST: 12 U/L (ref 0–37)
Albumin: 3.8 g/dL (ref 3.5–5.2)
Alkaline Phosphatase: 91 U/L (ref 39–117)
Bilirubin, Direct: 0.1 mg/dL (ref 0.0–0.3)
Total Bilirubin: 0.6 mg/dL (ref 0.2–1.2)
Total Protein: 6.3 g/dL (ref 6.0–8.3)

## 2024-01-21 LAB — LIPID PANEL
Cholesterol: 120 mg/dL (ref 0–200)
HDL: 56.5 mg/dL (ref >39.00)
LDL Cholesterol: 43 mg/dL (ref 0–99)
NonHDL: 63.16
Total CHOL/HDL Ratio: 2
Triglycerides: 101 mg/dL (ref 0.0–149.0)
VLDL: 20.2 mg/dL (ref 0.0–40.0)

## 2024-01-21 LAB — BASIC METABOLIC PANEL WITH GFR
BUN: 19 mg/dL (ref 6–23)
CO2: 25 meq/L (ref 19–32)
Calcium: 8.6 mg/dL (ref 8.4–10.5)
Chloride: 108 meq/L (ref 96–112)
Creatinine, Ser: 1.51 mg/dL — ABNORMAL HIGH (ref 0.40–1.50)
GFR: 42.83 mL/min — ABNORMAL LOW (ref >60.00)
Glucose, Bld: 132 mg/dL — ABNORMAL HIGH (ref 70–99)
Potassium: 4.6 meq/L (ref 3.5–5.1)
Sodium: 143 meq/L (ref 135–145)

## 2024-01-21 LAB — CBC WITH DIFFERENTIAL/PLATELET
Basophils Absolute: 0 K/uL (ref 0.0–0.1)
Basophils Relative: 0.5 % (ref 0.0–3.0)
Eosinophils Absolute: 0.2 K/uL (ref 0.0–0.7)
Eosinophils Relative: 3.4 % (ref 0.0–5.0)
HCT: 36.3 % — ABNORMAL LOW (ref 39.0–52.0)
Hemoglobin: 11.8 g/dL — ABNORMAL LOW (ref 13.0–17.0)
Lymphocytes Relative: 26.8 % (ref 12.0–46.0)
Lymphs Abs: 1.7 K/uL (ref 0.7–4.0)
MCHC: 32.4 g/dL (ref 30.0–36.0)
MCV: 83.5 fl (ref 78.0–100.0)
Monocytes Absolute: 0.6 K/uL (ref 0.1–1.0)
Monocytes Relative: 8.6 % (ref 3.0–12.0)
Neutro Abs: 4 K/uL (ref 1.4–7.7)
Neutrophils Relative %: 60.7 % (ref 43.0–77.0)
Platelets: 183 K/uL (ref 150.0–400.0)
RBC: 4.35 Mil/uL (ref 4.22–5.81)
RDW: 16.8 % — ABNORMAL HIGH (ref 11.5–15.5)
WBC: 6.5 K/uL (ref 4.0–10.5)

## 2024-01-21 LAB — IBC PANEL
Iron: 42 ug/dL (ref 42–165)
Saturation Ratios: 10.7 % — ABNORMAL LOW (ref 20.0–50.0)
TIBC: 392 ug/dL (ref 250.0–450.0)
Transferrin: 280 mg/dL (ref 212.0–360.0)

## 2024-01-21 LAB — HEMOGLOBIN A1C: Hgb A1c MFr Bld: 7.5 % — ABNORMAL HIGH (ref 4.6–6.5)

## 2024-01-21 LAB — FERRITIN: Ferritin: 12 ng/mL — ABNORMAL LOW (ref 22.0–322.0)

## 2024-01-21 LAB — TSH: TSH: 2.41 u[IU]/mL (ref 0.35–5.50)

## 2024-01-21 MED ORDER — POLYSACCHARIDE IRON COMPLEX 150 MG PO CAPS: 150.0000 mg | ORAL_CAPSULE | Freq: Every day | ORAL | 1 refills | Status: AC

## 2024-01-21 NOTE — Patient Instructions (Signed)
 Please continue all other medications as before, and refills have been done if requested.  Please have the pharmacy call with any other refills you may need.  Please continue your efforts at being more active, low cholesterol diet, and weight control.  You are otherwise up to date with prevention measures today.  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  Otherwise, you will receive a letter about your results with an explanation, but please check with MyChart first.  Please make an Appointment to return in 6 months, or sooner if needed
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
## 2024-01-22 ENCOUNTER — Encounter: Payer: Self-pay | Admitting: Internal Medicine

## 2024-01-22 DIAGNOSIS — R202 Paresthesia of skin: Secondary | ICD-10-CM | POA: Insufficient documentation

## 2024-01-22 NOTE — Assessment & Plan Note (Signed)
 Lab Results  Component Value Date   HGBA1C 7.5 (H) 01/21/2024   Uncontrolled but ok for age, declines further OHA for now

## 2024-01-22 NOTE — Assessment & Plan Note (Signed)
 Lab Results  Component Value Date   CREATININE 1.51 (H) 01/21/2024   Stable overall, cont to avoid nephrotoxins

## 2024-01-22 NOTE — Assessment & Plan Note (Signed)
 Last vitamin D Lab Results  Component Value Date   VD25OH 36.03 07/10/2023   Low, to start oral replacement

## 2024-01-22 NOTE — Assessment & Plan Note (Signed)
 BP Readings from Last 3 Encounters:  01/21/24 102/70  01/01/24 138/86  12/02/23 126/75   Stable, pt to continue medical treatment  - diet, wt control

## 2024-01-22 NOTE — Assessment & Plan Note (Signed)
 Etiology unclear, for neuro referral

## 2024-01-26 ENCOUNTER — Encounter: Payer: Self-pay | Admitting: Internal Medicine

## 2024-01-28 DIAGNOSIS — R2689 Other abnormalities of gait and mobility: Secondary | ICD-10-CM | POA: Diagnosis not present

## 2024-01-28 DIAGNOSIS — M6281 Muscle weakness (generalized): Secondary | ICD-10-CM | POA: Diagnosis not present

## 2024-01-28 MED ORDER — TIRZEPATIDE 2.5 MG/0.5ML ~~LOC~~ SOAJ: 2.5000 mg | SUBCUTANEOUS | 11 refills | Status: DC

## 2024-01-30 ENCOUNTER — Other Ambulatory Visit (HOSPITAL_COMMUNITY): Payer: Self-pay

## 2024-01-30 ENCOUNTER — Telehealth: Payer: Self-pay

## 2024-01-30 DIAGNOSIS — R2689 Other abnormalities of gait and mobility: Secondary | ICD-10-CM | POA: Diagnosis not present

## 2024-01-30 DIAGNOSIS — M6281 Muscle weakness (generalized): Secondary | ICD-10-CM | POA: Diagnosis not present

## 2024-01-30 NOTE — Telephone Encounter (Signed)
 Pharmacy Patient Advocate Encounter  Received notification from [facility] that Prior Authorization for Mounjaro  2.5mg /0.17ml has been APPROVED from 01/30/24 to 01/29/25   PA #/Case ID/Reference #: [id number]

## 2024-01-30 NOTE — Telephone Encounter (Signed)
 Pharmacy Patient Advocate Encounter   Received notification from Patient Advice Request messages that prior authorization for Mounjaro  2.5mg /0.85ml is required/requested.   Insurance verification completed.   The patient is insured through [facility] .   Per test claim: PA required; PA submitted to above mentioned insurance via Latent Key/confirmation #/EOC [id number] Status is pending

## 2024-02-09 ENCOUNTER — Encounter: Payer: Self-pay | Admitting: Internal Medicine

## 2024-02-12 DIAGNOSIS — M6281 Muscle weakness (generalized): Secondary | ICD-10-CM | POA: Diagnosis not present

## 2024-02-12 DIAGNOSIS — R2689 Other abnormalities of gait and mobility: Secondary | ICD-10-CM | POA: Diagnosis not present

## 2024-02-20 ENCOUNTER — Encounter: Payer: Self-pay | Admitting: Neurology

## 2024-02-24 DIAGNOSIS — R2689 Other abnormalities of gait and mobility: Secondary | ICD-10-CM | POA: Diagnosis not present

## 2024-02-24 DIAGNOSIS — M6281 Muscle weakness (generalized): Secondary | ICD-10-CM | POA: Diagnosis not present

## 2024-02-26 ENCOUNTER — Encounter: Payer: Self-pay | Admitting: Internal Medicine

## 2024-02-26 ENCOUNTER — Other Ambulatory Visit: Payer: Self-pay | Admitting: Family

## 2024-02-26 MED ORDER — TIRZEPATIDE 5 MG/0.5ML ~~LOC~~ SOAJ: 5.0000 mg | SUBCUTANEOUS | 1 refills | Status: DC

## 2024-02-27 ENCOUNTER — Encounter: Payer: Self-pay | Admitting: Internal Medicine

## 2024-03-02 DIAGNOSIS — R2689 Other abnormalities of gait and mobility: Secondary | ICD-10-CM | POA: Diagnosis not present

## 2024-03-02 DIAGNOSIS — M6281 Muscle weakness (generalized): Secondary | ICD-10-CM | POA: Diagnosis not present

## 2024-03-03 ENCOUNTER — Encounter: Payer: Self-pay | Admitting: Cardiology

## 2024-03-03 ENCOUNTER — Ambulatory Visit: Attending: Cardiology | Admitting: Cardiology

## 2024-03-03 VITALS — BP 94/68 | HR 71 | Ht 72.0 in | Wt 244.8 lb

## 2024-03-03 DIAGNOSIS — I2510 Atherosclerotic heart disease of native coronary artery without angina pectoris: Secondary | ICD-10-CM

## 2024-03-03 DIAGNOSIS — I493 Ventricular premature depolarization: Secondary | ICD-10-CM | POA: Diagnosis not present

## 2024-03-03 DIAGNOSIS — I951 Orthostatic hypotension: Secondary | ICD-10-CM | POA: Diagnosis not present

## 2024-03-03 DIAGNOSIS — E119 Type 2 diabetes mellitus without complications: Secondary | ICD-10-CM

## 2024-03-03 DIAGNOSIS — I739 Peripheral vascular disease, unspecified: Secondary | ICD-10-CM | POA: Diagnosis not present

## 2024-03-03 DIAGNOSIS — I10 Essential (primary) hypertension: Secondary | ICD-10-CM

## 2024-03-03 DIAGNOSIS — I9589 Other hypotension: Secondary | ICD-10-CM

## 2024-03-03 DIAGNOSIS — I70213 Atherosclerosis of native arteries of extremities with intermittent claudication, bilateral legs: Secondary | ICD-10-CM

## 2024-03-03 DIAGNOSIS — Z86711 Personal history of pulmonary embolism: Secondary | ICD-10-CM

## 2024-03-03 DIAGNOSIS — I440 Atrioventricular block, first degree: Secondary | ICD-10-CM | POA: Diagnosis not present

## 2024-03-03 DIAGNOSIS — E785 Hyperlipidemia, unspecified: Secondary | ICD-10-CM

## 2024-03-03 DIAGNOSIS — R0609 Other forms of dyspnea: Secondary | ICD-10-CM

## 2024-03-03 NOTE — Progress Notes (Signed)
 Cardiology Office Note:  .   Date:  03/07/2024  ID:  Alex Kemp, DOB 1940/07/10, MRN [id number] PCP: Norleen Lynwood ORN, MD  [facility] Providers Cardiologist:  Alvan Ronal BRAVO, MD (Inactive)     Chief Complaint  Patient presents with   Follow-up    Chronic hypotension.  Has had a couple falls because of balance.  Doing PT.   Coronary Artery Disease    No chest pain.  Just chronic dyspnea.    Patient Profile: .     Alex Kemp is an obese (formerly morbid obesity) 83 y.o. male  with a PMH notable for DM-2, Mod CAD, HLD, OSA & Hypotension  who presents here for 6 month f/u (to establish new cardiologist) at the request of Norleen Lynwood ORN, MD. he is a former patient of Dr. 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 Alvan (prior to her moving).  Her note indicated that he was doing well.  Remaining active with his community living, activities.  Had cataract surgery went well.  Hypotension had improved with midodrine .  Was started on Trelegy by pulmonary medicine for COPD.  No chest pain pressure or dyspnea.  No syncope.  Noted that he was not able to walk for mostly because of balance issues.  Chronic dyspnea was felt to be multifactorial.  No heart failure symptoms, borderline PFTs.  Trelegy seem to be helping.  6.4% RVOT PVCs on monitor.  Also significant dysautonomia was a major issue.  No longer on ARB, beta-blocker or even furosemide .  Placed on midodrine .  Negative adrenal workup.  Continue compression stockings.  Subjective  Discussed the use of AI  scribe software for clinical note transcription with the patient, who gave verbal consent to proceed.  History of Present Illness Alex Kemp is an 83 year old male with coronary artery disease who presents for follow-up regarding his coronary artery disease and related symptoms.  He has a history of coronary artery disease, with a coronary CT angiogram and catheterization performed in May of the previous year, revealing a blockage at an artery bifurcation. A stent was considered but not placed due to potential complications. He experiences shortness of breath but no recent chest pain. He carries nitroglycerin  but has not needed to use it. No heart racing, skipping, or palpitations.  He started Mounjaro  five weeks ago for prediabetes, with an initial A1c of 7.5. He has lost 8-10 pounds since starting the medication. He uses a continuous glucose monitor, which shows an average glucose level of 130, above his target range of 70-110. He has cut out caffeine and reduced dessert intake to manage his glucose levels. He experiences side effects from Mounjaro , including acid reflux, heartburn, and diarrhea, which have recently subsided. He has increased his water intake to manage these symptoms and prevent dehydration.  He has a history of low blood pressure, which led to a previous emergency room visit. He is currently on midodrine , which has helped stabilize his blood pressure.  He reports shortness of breath and back pain after walking 25 paces, requiring  the use of a wheelchair for longer distances. He has been attending physical therapy for balance and strength, which has improved his balance. He practices breathing techniques to manage his shortness of breath.  He lives in an independent living facility and has experienced falls, one due to tripping in a parking lot. He uses a cane for balance and is cautious when standing or turning to prevent dizziness and falls. He uses two pillows at night to  manage heartburn and has adjusted his sleep schedule to avoid nighttime acid reflux.  Cardiovascular ROS: positive for - dyspnea on exertion and unsteady gait.  Poor balance.  Has had falls.  Sleeps on 2 pillows more because of GERD and then orthopnea.  Occasional lightheadedness dizziness/presyncope negative for - chest pain, edema, irregular heartbeat, loss of consciousness, orthopnea, palpitations, paroxysmal nocturnal dyspnea, rapid heart rate, or TIA/amaurosis fugax or claudication symptoms.  ROS:  Review of Systems - Negative except symptoms noted above.    Objective   Current Meds  Medication Sig   alfuzosin  (UROXATRAL ) 10 MG 24 hr tablet Take 1 tablet (10 mg total) by mouth daily.   allopurinol  (ZYLOPRIM ) 100 MG tablet TAKE 1 TABLET DAILY.   Ascorbic Acid (VITAMIN C PO) Take 1,000 mg by mouth daily at 6 (six) AM.   b complex vitamins tablet Take 1 tablet by mouth daily with lunch.   cetirizine (ZYRTEC) 10 MG tablet Take 10 mg by mouth daily.   Cholecalciferol (VITAMIN D ) 50 MCG (2000 UT) tablet Take 2,000 Units by mouth daily.   cyanocobalamin  1000 MCG tablet Take 1,000 mcg by mouth daily.   diphenoxylate -atropine  (LOMOTIL ) 2.5-0.025 MG tablet Take 1 tablet by mouth 4 (four) times daily as needed for diarrhea or loose stools.   ezetimibe  (ZETIA ) 10 MG tablet Take 1 tablet (10 mg total) by mouth daily.   finasteride (PROSCAR) 5 MG tablet Take 5 mg by mouth daily.   iron  polysaccharides (NU-IRON ) 150 MG capsule Take 1 capsule (150 mg total) by mouth daily.   levalbuterol  (XOPENEX  HFA) 45 MCG/ACT inhaler Inhale 2 puffs into the lungs every 6 (six) hours as needed for wheezing.   midodrine  (PROAMATINE ) 5 MG tablet Take 1 tablet (5 mg total) by mouth 3 (three) times daily with meals.   Multiple Vitamin (MULTIVITAMIN WITH MINERALS) TABS tablet Take 1 tablet by mouth daily with lunch.   nitroGLYCERIN  (NITROSTAT ) 0.4 MG SL tablet Place 1 tablet (0.4 mg total) under the tongue every 5  (five) minutes as needed for chest pain.   pantoprazole  (PROTONIX ) 40 MG tablet TAKE 1 TABLET TWICE A DAY. (Patient taking differently: Take 40 mg by mouth daily.)   Polyvinyl Alcohol -Povidone (REFRESH OP) Place 1 drop into both eyes 2 (two) times daily.   rivaroxaban  (XARELTO ) 20 MG TABS tablet Take 1 tablet (20 mg total) by mouth daily with supper.   rOPINIRole  (REQUIP ) 1 MG tablet Take 1 tablet (1 mg total) by mouth at bedtime.   rosuvastatin  (CRESTOR ) 40 MG tablet Take 1 tablet (40 mg total) by mouth daily.   tirzepatide  (MOUNJARO ) 5 MG/0.5ML Pen Inject 5 mg into the skin once a week starting 03/2024   triamcinolone  (NASACORT ) 55 MCG/ACT AERO nasal inhaler Place 2 sprays into the nose daily. (Patient taking differently: Place 2 sprays into the nose in the morning.)    Social History: Lives in assisted living with his wife.  Very active participating in different activities.  Studies Reviewed: SABRA   EKG Interpretation Date/Time:  Wednesday March 03 2024 13:50:45 EDT Ventricular Rate:  71 PR Interval:  384 QRS Duration:  94 QT Interval:  404 QTC Calculation: 439 R Axis:   -21  Text Interpretation: Sinus rhythm with 1st degree A-V block T wave abnormality, consider lateral ischemia When compared with ECG of 19-Jun-2023 13:47, Vent. rate has decreased BY  51 BPM T wave inversion no longer evident in Inferior leads Confirmed by Anner Lenis ([id number]) on 03/03/2024 2:08:53 PM    Lab Results  Component Value Date   CHOL 120 01/21/2024   HDL 56.50 01/21/2024   LDLCALC 43 01/21/2024   LDLDIRECT 75.0 01/18/2022   TRIG 101.0 01/21/2024   CHOLHDL 2 01/21/2024   Lab Results  Component Value Date   WBC 6.5 01/21/2024   HGB 11.8 (L) 01/21/2024   HCT 36.3 (L) 01/21/2024   MCV 83.5 01/21/2024   PLT 183.0 01/21/2024   Lab Results  Component Value Date   NA 143 01/21/2024   K 4.6 01/21/2024   CREATININE 1.51 (H) 01/21/2024   GFR 42.83 (L) 01/21/2024   GLUCOSE 132 (H) 01/21/2024   Lab  Results  Component Value Date   HGBA1C 7.5 (H) 01/21/2024   Results LABS Continuous Blood Glucose Monitor: Average 130  RADIOLOGY Coronary CT Angiogram:  1. Coronary calcium  score of 1796. This was 82nd percentile for age-, sex, and race-matched controls. 2. Normal coronary origin with right dominance. 3. Mild ostial left main stenosis (25-49%).4. Severe proximal to mid LAD stenosis (70-99%).5. Severe proximal LCX stenosis (70-99%). 6. Severe distal LCX/OM2 lesion (70-99%). 7. Moderate plaque in the proximal and mid RCA (50-69%). 8. Dilated pulmonary artery suggestive of pulmonary hypertension. (10/03/2022)  DIAGNOSTIC Cardiac Catheterization: Right dominant system. pRCA 40%, p-mRCA 50%, mRCA 30%; mLCx 55% - RFR 0.96; Ost-prox LAD 30%, mLAD(between SP1/D1 & D2) ~60% RFR just prior to the lesion at 1st Diag 0.91, just beyond 2nd Diag Mid LAD-2 lesion is 40% stenosed. RFR 0.88 the 0.87 distally ). EF 50-55%.  Borderline RFR in the mid LAD-PCI not performed due to location between 2 side branches, and concern of jailing both sidebranches.  (10/04/2022)   Echocardiogram: LVEF 55 to 60%.  No RWMA.  Mild dilation.  Normal diastolic parameters.  Normal RV size and function normal RVSP and RAP.  AV sclerosis with no stenosis.  (03/28/2023) Echocardiogram (in setting of DVT and PE): ER 50-55%; No RWMA. Mildly dilated LV; Dilated IVC - RAP ~ 15 mmHg.  Mild AoV SClerosis (06/25/2022) Echocardiogram: EF 55 to 60%.  Normal RV function.  Mild RV enlargement.  Mild MR.  Risk Assessment/Calculations:         Physical Exam:   VS:  BP 94/68   Pulse 71   Ht 6' (1.829 m)   Wt 244 lb 12.8 oz (111 kg)   SpO2 96%   BMI 33.20 kg/m    Wt Readings from Last 3 Encounters:  03/03/24 244 lb 12.8 oz (111 kg)  01/21/24 250 lb 12.8 oz (113.8 kg)  01/01/24 249 lb (112.9 kg)      GEN: Relatively healthy appearing well nourished, well groomed in no acute distress; mildly obese NECK: No JVD; No carotid  bruits CARDIAC: Normal S1, S2; RRR, no murmurs, rubs, gallops RESPIRATORY:  Clear to auscultation without rales, wheezing or rhonchi ; nonlabored, good air movement. ABDOMEN: Soft, non-tender, non-distended EXTREMITIES: Trivial ankle edema; No deformity      ASSESSMENT AND PLAN: .    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 No current complaint of claudication mostly because he is not walking for now. Continue lipid management.      Coronary artery disease involving native coronary artery of native heart without angina pectoris - Primary (Chronic)   Coronary artery disease with moderate lesion in the LAD between 3 diagonal branches, managed conservatively due to absence of symptoms and stenting risks. - Provided he has no active anginal symptoms, continue conservative management without stenting. - Medical therapy limited by chronic hypotension-no longer on ARB or beta-blocker. - No aspirin  because of long-term Xarelto  - Continue rosuvastatin  40 mg daily and ezetimibe  10 mL daily for lipid management.  If he were to have anginal symptoms, would initially try Ranexa.      Dysautonomia orthostatic hypotension syndrome (Chronic)   Chronic hypotension managed with midodrine .  No longer on ARB or beta-blocker. Advised hydration and salt intake to maintain blood pressure. - Continue midodrine  5 mg 3 times daily therapy. - Increase fluid intake and gently liberalize salt intake. - Monitor blood pressure regularly.      Hyperlipidemia with target low density lipoprotein (LDL) cholesterol less than 55 mg/dL (Chronic)   Lipids well-controlled on rosuvastatin  40 mg daily and Zetia  10 mg daily.  LDL 43. - Continue current regimen.        Other   Chronic dyspnea (Chronic)   Chronic shortness of breath is likely  multifactorial with COPD, deconditioning, obesity and CAD are playing a role. Symptoms improve with weight loss and breathing techniques. - Continue breathing exercises and techniques. - He had been on Trelegy, but it no longer listed on his med list.      Diabetes mellitus type II, non insulin dependent (HCC) (Chronic)   Type 2 diabetes mellitus with A1c of 7.5, managed with Mounjaro . Average glucose 130, above target. Weight loss beneficial for glucose control. - Continue Mounjaro  (tirzepatide ) therapy. => Monitor nutrition and hydration closely.  This can potentially exacerbate his hypotension. - Monitor glucose levels regularly. - Encourage continued weight loss and dietary modifications.      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  Remains on long-term Xarelto .  - Okay to hold Xarelto  2 days preop for surgeries or procedures.      Other Visit Diagnoses       PVC's (premature ventricular contractions)       Relevant Orders   EKG 12-Lead (Completed)     First degree AV block       Relevant Orders   EKG 12-Lead (Completed)       Assessment and Plan Assessment & Plan Noncardiac Issues:  Obesity Obesity with recent weight loss of 8-10 pounds, attributed to Mounjaro  and lifestyle changes. Weight loss expected to improve health. - Continue weight loss efforts through diet and exercise. - Monitor weight regularly.  Chronic back pain Chronic back pain exacerbated by walking. Weight loss and physical therapy expected to alleviate symptoms. - Continue physical therapy focusing on strength and balance. - Encourage weight loss to alleviate back pain.  Chronic balance impairment Chronic balance impairment with falls. Engaged in physical therapy for balance and strength. Advised precautions to prevent falls. - Continue physical therapy for balance improvement. - Use assistive devices as needed for stability.           Follow-Up: Return  in about 1 year (around 03/03/2025)  for 1 Yr Follow-up, Routine follow up with me.  I spent 56 minutes in the care of Sie Formisano today including reviewing labs (2 minutes), reviewing studies (Coronary CTA and cath films along with 3 echoes reviewed-9 minutes), face to face time discussing treatment options (25 minutes), reviewing records from prior notes with Dr. Alvan as well as Dr. Norleen (6 minutes), 14 minutes dictating, updating PMH, and documenting in the encounter.      Signed, Alex MICAEL Clay, MD, MS Alex Kemp, M.D., M.S. Interventional Cardiologist  [facility] Pager # [phone]

## 2024-03-03 NOTE — Patient Instructions (Signed)
 Medication Instructions:  Continue same medications *If you need a refill on your cardiac medications before your next appointment, please call your pharmacy*  Lab Work: None ordered  Testing/Procedures: None ordered  Follow-Up: At [facility], you and your health needs are our priority.  As part of our continuing mission to provide you with exceptional heart care, our providers are all part of one team.  This team includes your primary Cardiologist (physician) and Advanced Practice Providers or APPs (Physician Assistants and Nurse Practitioners) who all work together to provide you with the care you need, when you need it.  Your next appointment:  1 year   Call in May to schedule Oct appointment      Provider:  Dr.Harding   We recommend signing up for the patient portal called MyChart.  Sign up information is provided on this After Visit Summary.  MyChart is used to connect with patients for Virtual Visits (Telemedicine).  Patients are able to view lab/test results, encounter notes, upcoming appointments, etc.  Non-urgent messages can be sent to your provider as well.   To learn more about what you can do with MyChart, go to [email or web address].

## 2024-03-04 DIAGNOSIS — M6281 Muscle weakness (generalized): Secondary | ICD-10-CM | POA: Diagnosis not present

## 2024-03-04 DIAGNOSIS — R2689 Other abnormalities of gait and mobility: Secondary | ICD-10-CM | POA: Diagnosis not present

## 2024-03-07 ENCOUNTER — Encounter: Payer: Self-pay | Admitting: Cardiology

## 2024-03-07 DIAGNOSIS — E119 Type 2 diabetes mellitus without complications: Secondary | ICD-10-CM | POA: Insufficient documentation

## 2024-03-07 NOTE — Assessment & Plan Note (Signed)
 History of left-sided SFA and Posterior Tibial PTA. No current complaint of claudication mostly because he is not walking for now. Continue lipid management.

## 2024-03-07 NOTE — Assessment & Plan Note (Signed)
 Coronary artery disease with moderate lesion in the LAD between 3 diagonal branches, managed conservatively due to absence of symptoms and stenting risks. - Provided he has no active anginal symptoms, continue conservative management without stenting. - Medical therapy limited by chronic hypotension-no longer on ARB or beta-blocker. - No aspirin  because of long-term Xarelto  - Continue rosuvastatin  40 mg daily and ezetimibe  10 mL daily for lipid management.  If he were to have anginal symptoms, would initially try Ranexa.

## 2024-03-07 NOTE — Assessment & Plan Note (Signed)
 Type 2 diabetes mellitus with A1c of 7.5, managed with Mounjaro . Average glucose 130, above target. Weight loss beneficial for glucose control. - Continue Mounjaro  (tirzepatide ) therapy. => Monitor nutrition and hydration closely.  This can potentially exacerbate his hypotension. - Monitor glucose levels regularly. - Encourage continued weight loss and dietary modifications.

## 2024-03-07 NOTE — Assessment & Plan Note (Signed)
 Chronic shortness of breath is likely multifactorial with COPD, deconditioning, obesity and CAD are playing a role. Symptoms improve with weight loss and breathing techniques. - Continue breathing exercises and techniques. - He had been on Trelegy, but it no longer listed on his med list.

## 2024-03-07 NOTE — Assessment & Plan Note (Signed)
 Lipids well-controlled on rosuvastatin  40 mg daily and Zetia  10 mg daily.  LDL 43. - Continue current regimen.

## 2024-03-07 NOTE — Assessment & Plan Note (Addendum)
 Chronic hypotension managed with midodrine .  No longer on ARB or beta-blocker. Advised hydration and salt intake to maintain blood pressure. - Continue midodrine  5 mg 3 times daily therapy. - Increase fluid intake and gently liberalize salt intake. - Monitor blood pressure regularly.

## 2024-03-07 NOTE — Assessment & Plan Note (Signed)
 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  Remains on long-term Xarelto .  - Okay to hold Xarelto  2 days preop for surgeries or procedures.

## 2024-03-09 DIAGNOSIS — R2689 Other abnormalities of gait and mobility: Secondary | ICD-10-CM | POA: Diagnosis not present

## 2024-03-09 DIAGNOSIS — M6281 Muscle weakness (generalized): Secondary | ICD-10-CM | POA: Diagnosis not present

## 2024-03-11 ENCOUNTER — Other Ambulatory Visit (HOSPITAL_COMMUNITY): Payer: Self-pay

## 2024-03-13 ENCOUNTER — Other Ambulatory Visit: Payer: Self-pay | Admitting: Internal Medicine

## 2024-03-13 DIAGNOSIS — I10 Essential (primary) hypertension: Secondary | ICD-10-CM

## 2024-03-13 DIAGNOSIS — I739 Peripheral vascular disease, unspecified: Secondary | ICD-10-CM

## 2024-03-15 ENCOUNTER — Encounter: Payer: Self-pay | Admitting: Internal Medicine

## 2024-03-15 ENCOUNTER — Other Ambulatory Visit: Payer: Self-pay

## 2024-03-16 DIAGNOSIS — R2689 Other abnormalities of gait and mobility: Secondary | ICD-10-CM | POA: Diagnosis not present

## 2024-03-16 DIAGNOSIS — M6281 Muscle weakness (generalized): Secondary | ICD-10-CM | POA: Diagnosis not present

## 2024-03-23 DIAGNOSIS — M6281 Muscle weakness (generalized): Secondary | ICD-10-CM | POA: Diagnosis not present

## 2024-03-23 DIAGNOSIS — R2689 Other abnormalities of gait and mobility: Secondary | ICD-10-CM | POA: Diagnosis not present

## 2024-03-25 DIAGNOSIS — R2689 Other abnormalities of gait and mobility: Secondary | ICD-10-CM | POA: Diagnosis not present

## 2024-03-25 DIAGNOSIS — R399 Unspecified symptoms and signs involving the genitourinary system: Secondary | ICD-10-CM | POA: Diagnosis not present

## 2024-03-25 DIAGNOSIS — M6281 Muscle weakness (generalized): Secondary | ICD-10-CM | POA: Diagnosis not present

## 2024-03-25 DIAGNOSIS — N401 Enlarged prostate with lower urinary tract symptoms: Secondary | ICD-10-CM | POA: Diagnosis not present

## 2024-03-25 DIAGNOSIS — N200 Calculus of kidney: Secondary | ICD-10-CM | POA: Diagnosis not present

## 2024-03-25 DIAGNOSIS — N281 Cyst of kidney, acquired: Secondary | ICD-10-CM | POA: Diagnosis not present

## 2024-04-06 DIAGNOSIS — M6281 Muscle weakness (generalized): Secondary | ICD-10-CM | POA: Diagnosis not present

## 2024-04-06 DIAGNOSIS — R2689 Other abnormalities of gait and mobility: Secondary | ICD-10-CM | POA: Diagnosis not present

## 2024-04-08 DIAGNOSIS — R2689 Other abnormalities of gait and mobility: Secondary | ICD-10-CM | POA: Diagnosis not present

## 2024-04-08 DIAGNOSIS — M6281 Muscle weakness (generalized): Secondary | ICD-10-CM | POA: Diagnosis not present

## 2024-04-11 ENCOUNTER — Other Ambulatory Visit: Payer: Self-pay | Admitting: Internal Medicine

## 2024-04-13 DIAGNOSIS — D6869 Other thrombophilia: Secondary | ICD-10-CM | POA: Diagnosis not present

## 2024-04-13 DIAGNOSIS — I129 Hypertensive chronic kidney disease with stage 1 through stage 4 chronic kidney disease, or unspecified chronic kidney disease: Secondary | ICD-10-CM | POA: Diagnosis not present

## 2024-04-29 DIAGNOSIS — R2689 Other abnormalities of gait and mobility: Secondary | ICD-10-CM | POA: Diagnosis not present

## 2024-04-29 DIAGNOSIS — M6281 Muscle weakness (generalized): Secondary | ICD-10-CM | POA: Diagnosis not present

## 2024-04-30 ENCOUNTER — Encounter: Payer: Self-pay | Admitting: Cardiology

## 2024-04-30 ENCOUNTER — Encounter: Payer: Self-pay | Admitting: Internal Medicine

## 2024-04-30 MED ORDER — TIRZEPATIDE 7.5 MG/0.5ML ~~LOC~~ SOAJ: 7.5000 mg | SUBCUTANEOUS | 3 refills | Status: AC

## 2024-05-03 ENCOUNTER — Telehealth: Payer: Self-pay | Admitting: Internal Medicine

## 2024-05-03 NOTE — Telephone Encounter (Signed)
 See my chart message

## 2024-05-04 DIAGNOSIS — R2689 Other abnormalities of gait and mobility: Secondary | ICD-10-CM | POA: Diagnosis not present

## 2024-05-04 DIAGNOSIS — M6281 Muscle weakness (generalized): Secondary | ICD-10-CM | POA: Diagnosis not present

## 2024-05-06 DIAGNOSIS — M6281 Muscle weakness (generalized): Secondary | ICD-10-CM | POA: Diagnosis not present

## 2024-05-06 DIAGNOSIS — R2689 Other abnormalities of gait and mobility: Secondary | ICD-10-CM | POA: Diagnosis not present

## 2024-05-06 NOTE — Telephone Encounter (Signed)
 Unfortunate I cannot see the results scanned in from his [employer].  Thankfully, he already is on Xarelto .  However if he is indeed having episodes of A-fib it would be nice to document this it is history open testing considered if he would be a candidate for any antiarrhythmics.  Would be nice to give him seen soon in order to review the [employer] results and discuss options.  In an effort to get him in quickly, we can get him in to be seen either in A-fib clinic or by an APP and then I will see him after that.   Alm Clay, MD

## 2024-05-07 ENCOUNTER — Encounter: Payer: Self-pay | Admitting: Cardiology

## 2024-05-08 NOTE — Telephone Encounter (Signed)
 I think probably we should have him come in with his [employer] print outs and see one of the APP's in the next week or 2 (hopefully before I go on vacation).  If he indeed has A-fib we just need to figure out if it is rate controlled, symptomatic, and potentially consider DOAC.   Alm Clay, MD

## 2024-05-10 NOTE — Telephone Encounter (Signed)
 If no [employer] APP slots open - can he be seen in Afib Clinic to assess his [facility]. -- I just can't see images to diagnose. & don't want to wait until [place].  DH

## 2024-05-12 NOTE — Telephone Encounter (Signed)
 The rhythm strips do look like it could likely be atrial fibrillation.  Unfortunately do not have any spots to see anyone in clinic.  Would like to see if we can get you arranged to see one of our APP's either in regular clinic or in the atrial fibrillation clinic.  Thankfully, you are already on Xarelto  which is for mainstay treatments for stroke prevention.   Alm Clay, MD

## 2024-05-13 NOTE — Telephone Encounter (Signed)
 So we finally uploaded some of the images and it does appear that there may be some A-fib.  Thankfully he is already on Xarelto .  Will try to get him in to be seen.  Alm Clay, MD

## 2024-05-17 NOTE — Telephone Encounter (Signed)
 See other mychart message.

## 2024-05-24 ENCOUNTER — Encounter: Payer: Self-pay | Admitting: Gastroenterology

## 2024-05-24 ENCOUNTER — Other Ambulatory Visit

## 2024-05-24 ENCOUNTER — Ambulatory Visit: Admitting: Gastroenterology

## 2024-05-24 VITALS — BP 102/70 | HR 83 | Ht 72.0 in | Wt 218.0 lb

## 2024-05-24 DIAGNOSIS — K219 Gastro-esophageal reflux disease without esophagitis: Secondary | ICD-10-CM | POA: Diagnosis not present

## 2024-05-24 DIAGNOSIS — D509 Iron deficiency anemia, unspecified: Secondary | ICD-10-CM

## 2024-05-24 LAB — IBC + FERRITIN
Ferritin: 37.6 ng/mL (ref 22.0–322.0)
Iron: 55 ug/dL (ref 42–165)
Saturation Ratios: 16.6 % — ABNORMAL LOW (ref 20.0–50.0)
TIBC: 331.8 ug/dL (ref 250.0–450.0)
Transferrin: 237 mg/dL (ref 212.0–360.0)

## 2024-05-24 LAB — CBC WITH DIFFERENTIAL/PLATELET
Basophils Absolute: 0.1 K/uL (ref 0.0–0.1)
Basophils Relative: 0.9 % (ref 0.0–3.0)
Eosinophils Absolute: 0.2 K/uL (ref 0.0–0.7)
Eosinophils Relative: 2.1 % (ref 0.0–5.0)
HCT: 37.1 % — ABNORMAL LOW (ref 39.0–52.0)
Hemoglobin: 12.5 g/dL — ABNORMAL LOW (ref 13.0–17.0)
Lymphocytes Relative: 26.4 % (ref 12.0–46.0)
Lymphs Abs: 2.8 K/uL (ref 0.7–4.0)
MCHC: 33.7 g/dL (ref 30.0–36.0)
MCV: 85.2 fl (ref 78.0–100.0)
Monocytes Absolute: 0.8 K/uL (ref 0.1–1.0)
Monocytes Relative: 7.6 % (ref 3.0–12.0)
Neutro Abs: 6.8 K/uL (ref 1.4–7.7)
Neutrophils Relative %: 63 % (ref 43.0–77.0)
Platelets: 233 K/uL (ref 150.0–400.0)
RBC: 4.35 Mil/uL (ref 4.22–5.81)
RDW: 17.6 % — ABNORMAL HIGH (ref 11.5–15.5)
WBC: 10.7 K/uL — ABNORMAL HIGH (ref 4.0–10.5)
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
## 2024-05-24 NOTE — Patient Instructions (Signed)
 Your provider has requested that you go to the basement level for lab work before leaving today. Press B on the elevator. The lab is located at the first door on the left as you exit the elevator.  Reflux [employer]met samples.   _______________________________________________________  If your blood pressure at your visit was 140/90 or greater, please contact your primary care physician to follow up on this.  _______________________________________________________  If you are age 82 or older, your body mass index should be between 23-30. Your Body mass index is 29.57 kg/m. If this is out of the aforementioned range listed, please consider follow up with your Primary Care Provider.  If you are age 79 or younger, your body mass index should be between 19-25. Your Body mass index is 29.57 kg/m. If this is out of the aformentioned range listed, please consider follow up with your Primary Care Provider.   ________________________________________________________  The [facility] providers would like to encourage you to use MYCHART to communicate with providers for non-urgent requests or questions.  Due to long hold times on the telephone, sending your provider a message by [facility] may be a faster and more efficient way to get a response.  Please allow 48 business hours for a response.  Please remember that this is for non-urgent requests.  _______________________________________________________  [facility] is using a team-based approach to care.  Your team is made up of your doctor and two to three APPS. Our APPS (Nurse Practitioners and Physician Assistants) work with your physician to ensure care continuity for you. They are fully qualified to address your health concerns and develop a treatment plan. They communicate directly with your gastroenterologist to care for you. Seeing the Advanced Practice Practitioners on your physician's team can help you by facilitating care more promptly,  often allowing for earlier appointments, access to diagnostic testing, procedures, and other specialty referrals.

## 2024-05-25 ENCOUNTER — Other Ambulatory Visit: Payer: Self-pay | Admitting: *Deleted

## 2024-05-25 ENCOUNTER — Encounter: Payer: Self-pay | Admitting: *Deleted

## 2024-05-25 ENCOUNTER — Ambulatory Visit: Payer: Self-pay | Admitting: Gastroenterology

## 2024-05-25 DIAGNOSIS — K219 Gastro-esophageal reflux disease without esophagitis: Secondary | ICD-10-CM

## 2024-05-25 DIAGNOSIS — D509 Iron deficiency anemia, unspecified: Secondary | ICD-10-CM

## 2024-05-25 MED ORDER — NA SULFATE-K SULFATE-MG SULF 17.5-3.13-1.6 GM/177ML PO SOLN: 1.0000 | Freq: Once | ORAL | 0 refills | Status: AC

## 2024-05-25 NOTE — Progress Notes (Signed)
"   Attending Physician's Attestation   I have reviewed the chart.   I agree with the Advanced Practitioner's note, impression, and recommendations with any updates as below. If GERD symptoms or upper GI symptoms persist, agree that based on his age and progressive symptoms updated endoscopy is reasonable.  Can trial PPI for period of time and lifestyle modifications but again if not fully improved endoscopic evaluation should strongly be considered. Pending the laboratory evaluation if patient continues to have anemia and/or iron  deficiency with low ferritin and/or low iron  saturation, while on oral iron  supplementation, strongly recommend endoscopic evaluation from above and below.  Otherwise if improved, could continue to monitor for some period.   Aloha Finner, MD [facility] Advanced Endoscopy Office # [phone]  "

## 2024-05-26 ENCOUNTER — Encounter (HOSPITAL_COMMUNITY): Payer: Self-pay | Admitting: Physician Assistant

## 2024-05-26 ENCOUNTER — Other Ambulatory Visit (INDEPENDENT_AMBULATORY_CARE_PROVIDER_SITE_OTHER): Payer: Self-pay | Admitting: Nurse Practitioner

## 2024-05-26 ENCOUNTER — Ambulatory Visit (HOSPITAL_COMMUNITY)
Admission: RE | Admit: 2024-05-26 | Discharge: 2024-05-26 | Disposition: A | Discharge status: Home / Self Care | Source: Ambulatory Visit | Attending: Physician Assistant | Admitting: Physician Assistant

## 2024-05-26 ENCOUNTER — Encounter: Payer: Self-pay | Admitting: Internal Medicine

## 2024-05-26 VITALS — BP 92/58 | HR 66 | Ht 72.0 in | Wt 218.8 lb

## 2024-05-26 DIAGNOSIS — I4891 Unspecified atrial fibrillation: Secondary | ICD-10-CM | POA: Diagnosis not present

## 2024-05-26 DIAGNOSIS — I10 Essential (primary) hypertension: Secondary | ICD-10-CM | POA: Diagnosis not present

## 2024-05-26 DIAGNOSIS — I2510 Atherosclerotic heart disease of native coronary artery without angina pectoris: Secondary | ICD-10-CM | POA: Diagnosis not present

## 2024-05-26 DIAGNOSIS — I441 Atrioventricular block, second degree: Secondary | ICD-10-CM | POA: Insufficient documentation

## 2024-05-26 DIAGNOSIS — I824Y9 Acute embolism and thrombosis of unspecified deep veins of unspecified proximal lower extremity: Secondary | ICD-10-CM

## 2024-05-26 DIAGNOSIS — Z9889 Other specified postprocedural states: Secondary | ICD-10-CM
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
## 2024-05-26 NOTE — Addendum Note (Signed)
 Encounter addended by: Wyn Jackee VEAR Mickey., NP on: 05/26/2024 4:18 PM  Actions taken: Clinical Note Signed

## 2024-05-28 ENCOUNTER — Encounter: Payer: Self-pay | Admitting: Cardiology

## 2024-05-28 ENCOUNTER — Other Ambulatory Visit: Payer: Self-pay

## 2024-05-28 DIAGNOSIS — I82491 Acute embolism and thrombosis of other specified deep vein of right lower extremity: Secondary | ICD-10-CM

## 2024-05-28 DIAGNOSIS — I824Y1 Acute embolism and thrombosis of unspecified deep veins of right proximal lower extremity: Secondary | ICD-10-CM

## 2024-05-28 MED ORDER — EZETIMIBE 10 MG PO TABS: 10.0000 mg | ORAL_TABLET | Freq: Every day | ORAL | 3 refills | Status: DC

## 2024-05-31 ENCOUNTER — Ambulatory Visit (INDEPENDENT_AMBULATORY_CARE_PROVIDER_SITE_OTHER): Admitting: Neurology

## 2024-05-31 ENCOUNTER — Encounter: Payer: Self-pay | Admitting: Neurology

## 2024-05-31 ENCOUNTER — Other Ambulatory Visit

## 2024-05-31 VITALS — BP 100/65 | HR 85 | Ht 72.0 in | Wt 221.0 lb

## 2024-05-31 DIAGNOSIS — R2681 Unsteadiness on feet: Secondary | ICD-10-CM

## 2024-05-31 DIAGNOSIS — G629 Polyneuropathy, unspecified: Secondary | ICD-10-CM

## 2024-05-31 MED ORDER — RIVAROXABAN 20 MG PO TABS: 20.0000 mg | ORAL_TABLET | Freq: Every day | ORAL | 1 refills | Status: AC
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
## 2024-05-31 NOTE — Telephone Encounter (Signed)
 Prescription refill request for Xarelto  received.  Indication: a-fib Last office visit: 03/03/2024 Weight: 99.2kg Age: 84 Scr: 1.51 01/21/2024 CrCl: 53  Dose appropriate, refill sent

## 2024-06-03 ENCOUNTER — Encounter (INDEPENDENT_AMBULATORY_CARE_PROVIDER_SITE_OTHER)

## 2024-06-03 ENCOUNTER — Ambulatory Visit (INDEPENDENT_AMBULATORY_CARE_PROVIDER_SITE_OTHER): Admitting: Vascular Surgery

## 2024-06-03 ENCOUNTER — Encounter (INDEPENDENT_AMBULATORY_CARE_PROVIDER_SITE_OTHER): Payer: Self-pay

## 2024-06-04 ENCOUNTER — Other Ambulatory Visit: Payer: Self-pay | Admitting: Internal Medicine

## 2024-06-04 ENCOUNTER — Other Ambulatory Visit: Payer: Self-pay

## 2024-06-05 LAB — PROTEIN ELECTROPHORESIS, SERUM
Albumin ELP: 3.4 g/dL — ABNORMAL LOW (ref 3.8–4.8)
Alpha 1: 0.4 g/dL — ABNORMAL HIGH (ref 0.2–0.3)
Alpha 2: 0.7 g/dL (ref 0.5–0.9)
Beta 2: 0.3 g/dL (ref 0.2–0.5)
Beta Globulin: 0.4 g/dL (ref 0.4–0.6)
Gamma Globulin: 0.8 g/dL (ref 0.8–1.7)
Total Protein: 6 g/dL — ABNORMAL LOW (ref 6.1–8.1)

## 2024-06-05 LAB — IMMUNOFIXATION ELECTROPHORESIS
IgG (Immunoglobin G), Serum: 661 mg/dL (ref 600–1540)
IgM, Serum: 468 mg/dL — ABNORMAL HIGH (ref 50–300)
Immunoglobulin A: 108 mg/dL (ref 70–320)

## 2024-06-05 LAB — COPPER, SERUM: Copper: 124 ug/dL (ref 70–175)

## 2024-06-07 ENCOUNTER — Ambulatory Visit: Attending: Cardiovascular Disease | Admitting: Cardiovascular Disease

## 2024-06-07 ENCOUNTER — Encounter: Payer: Self-pay | Admitting: Cardiovascular Disease

## 2024-06-07 ENCOUNTER — Ambulatory Visit: Payer: Self-pay | Admitting: Neurology

## 2024-06-07 VITALS — BP 104/58 | HR 71 | Ht 72.0 in | Wt 219.0 lb

## 2024-06-07 DIAGNOSIS — I441 Atrioventricular block, second degree: Secondary | ICD-10-CM | POA: Diagnosis not present

## 2024-06-07 DIAGNOSIS — Z01812 Encounter for preprocedural laboratory examination: Secondary | ICD-10-CM | POA: Diagnosis not present

## 2024-06-07 DIAGNOSIS — I493 Ventricular premature depolarization: Secondary | ICD-10-CM

## 2024-06-07 NOTE — Patient Instructions (Signed)
 Medication Instructions:  Your physician recommends that you continue on your current medications as directed. Please refer to the Current Medication list given to you today.  *If you need a refill on your cardiac medications before your next appointment, please call your pharmacy*  Lab Work: CBC and BMET week of June 28, 2024 - you do not need to be fasting If you have labs (blood work) drawn today and your tests are completely normal, you will receive your results only by: MyChart Message (if you have MyChart) OR A paper copy in the mail If you have any lab test that is abnormal or we need to change your treatment, we will call you to review the results.  Testing/Procedures:  Your physician has requested that you have an echocardiogram. Echocardiography is a painless test that uses sound waves to create images of your heart. It provides your doctor with information about the size and shape of your heart and how well your hearts chambers and valves are working. This procedure takes approximately one hour. There are no restrictions for this procedure. Please do NOT wear cologne, perfume, aftershave, or lotions (deodorant is allowed). Please arrive 15 minutes prior to your appointment time.  Please note: We ask at that you not bring children with you during ultrasound (echo/ vascular) testing. Due to room size and safety concerns, children are not allowed in the ultrasound rooms during exams. Our front office staff cannot provide observation of children in our lobby area while testing is being conducted. An adult accompanying a patient to their appointment will only be allowed in the ultrasound room at the discretion of the ultrasound technician under special circumstances. We apologize for any inconvenience.  Your physician has recommended that you have a pacemaker inserted. A pacemaker is a small device that is placed under the skin of your chest or abdomen to help control abnormal heart  rhythms. This device uses electrical pulses to prompt the heart to beat at a normal rate. Pacemakers are used to treat heart rhythms that are too slow. Wire (leads) are attached to the pacemaker that goes into the chambers of you heart. This is done in the hospital and usually requires and overnight stay. Please see the instruction sheet given to you today for more information.   Follow-Up: At [facility], you and your health needs are our priority.  As part of our continuing mission to provide you with exceptional heart care, our providers are all part of one team.  This team includes your primary Cardiologist (physician) and Advanced Practice Providers or APPs (Physician Assistants and Nurse Practitioners) who all work together to provide you with the care you need, when you need it.  Your next appointment:   Follow up to be scheduled

## 2024-06-07 NOTE — Progress Notes (Signed)
 " Electrophysiology Office Note:    Date:  06/07/2024   ID:  Alex Kemp, DOB December 04, 1940, MRN [id number]  PCP:  Norleen Lynwood ORN, MD   [facility] Providers Cardiologist:  Alvan Ronal BRAVO, MD (Inactive)     Referring MD: Nellene Quita SAUNDERS, [place]   History of Present Illness:    Alex Kemp is a 84 y.o. male with a medical history significant for atrial fibrillation, CAD, PVCs PAD, DVT on Xarelto  referred for arrhythmia management.     Discussed the use of AI scribe software for clinical note transcription with the patient, who gave verbal consent to proceed.  History of Present Illness  Monitor was placed in January 2025 after admission for DVT and during which she was noted to have abnormal rhythms.  This showed numerous episodes of SVT lasting up to a few hours in duration.  He got a Kardia mobile device and this detected atrial fibrillation December 2025.  He was referred to atrial fibrillation clinic.  During his AF clinic visit on May 26, 2024, Mobitz type AV block was noted on EKG.   He started Mounjaro  in September 2025. He had several days of heartburn last week but no ongoing symptoms.  He previously ran ten miles a day six days a week but stopped in 1995, with reduced activity and weight gain since then. He has no lightheadedness or dizziness.         Today, he reports that he is at baseline with fatigue and shortness of breath though he does not have any dizziness or presyncope.  EKGs/Labs/Other Studies Reviewed Today:     Echocardiogram:  TTE November 2024 LVEF 55 to 60%.  Mild LVH.   Monitors:  14 day monitor January 2025-- my interpretation Sinus rhythm, heart rate 52-89 beats minute, average 78 bpm Over thousand episodes of SVT reported.  These episodes correlated with shortness of breath     EKG:   EKG Interpretation Date/Time:  Monday June 07 2024 14:00:17 EST Ventricular Rate:  71 PR Interval:    QRS Duration:  86 QT  Interval:  416 QTC Calculation: 452 R Axis:   -6  Text Interpretation: Sinus rhythm with second degree AV block Minimal voltage criteria for LVH, may be normal variant ( R in aVL ) Inferior infarct , age undetermined ST & T wave abnormality, consider lateral ischemia When compared with ECG of 26-May-2024 14:36, No significant change was found Confirmed by Alex Kemp [phone]) on 06/07/2024 2:11:11 PM     Physical Exam:    VS:  BP (!) 104/58 (BP Location: Right Arm, Patient Position: Sitting, Cuff Size: Large)   Pulse 71   Ht 6' (1.829 m)   Wt 219 lb (99.3 kg)   SpO2 97%   BMI 29.70 kg/m     Wt Readings from Last 3 Encounters:  06/07/24 219 lb (99.3 kg)  05/31/24 221 lb (100.2 kg)  05/26/24 218 lb 12.8 oz (99.2 kg)     GEN: Well nourished, well developed in no acute distress CARDIAC: RRR, no murmurs, rubs, gallops RESPIRATORY:  Normal work of breathing MUSCULOSKELETAL: no edema    ASSESSMENT & PLAN:     SVT Many episodes of elevated rate on monitor November 2024, longest over an hour There appear to be distinct P waves, consistent with a long RP tachycardia, but difficult to tell on available rhythm strips I  I have not seen EKG documentation of atrial fibrillation Will monitor with loop recorder  Coronary artery disease  Moderate to severe three-vessel coronary disease Medical management recommended  Mobitz type II AV block Appears to be symptomatic with fatigue and shortness of breath He is not on any rate controlling medications His cardia appears to be nonreversible I  discussed the indication for the procedure and the logistics, risks, potential benefit, and after care. I specifically explained that risks include but are not limited to infection, bleeding,damage to blood vessels, lung, and the heart -- but risk of prolonged hospitalization, need for surgery, or the event of stroke, heart attack, or death are low but not zero.    History of DVT On Xarelto  20 I  suspect the DVT was provoked by his right knee surgery   Signed, Eulas FORBES Furbish, MD  06/07/2024 2:27 PM    [facility] "

## 2024-06-08 ENCOUNTER — Telehealth: Payer: Self-pay | Admitting: Gastroenterology

## 2024-06-08 NOTE — Telephone Encounter (Signed)
 Alex Kemp can you please review and advise?

## 2024-06-08 NOTE — Telephone Encounter (Signed)
 Inbound call from patient stating he is scheduled for a heart pace maker on 07/12/24 and was advised after heart pace maker he cannot lay on left side for a week or two. Patient would like to be advised on what to do since he is scheduled for a double procedure on 07/20/24 Requesting a call back  Please advise  Thank you

## 2024-06-08 NOTE — Telephone Encounter (Signed)
 The pt has been advised that procedures will have to be postponed. He will call us  when cardiology is ok with proceeding.  Recall has been entered to ensure pt is not lost to follow up.

## 2024-06-10 ENCOUNTER — Telehealth: Payer: Self-pay

## 2024-06-10 NOTE — Telephone Encounter (Signed)
-----   Message from Nurse Aldona SAUNDERS, RN sent at 06/07/2024  6:36 PM EST ----- Regarding: PPM - 2/16 Mealor Important: list procedure date as first item in subject line, followed by procedure type (e.g., 02/06/24 PPM implant)  Precert:  MD: Mealor Type of implant: PPM Device manufacturer: ? Diagnosis: Second degree AV block CPT code: PPM implant, dual - 66791 C-code(s), including quantity (if indicated):  Procedure scheduled (date/time): 2/16 130pm  Procedure:  Scrub given? Yes  Medication instructions: hold your Xarelto  (Rivaroxaban ) for 1 day(s) prior to your procedure.  Message sent to CV[facility]? No Added to calendar? Yes Orders entered? No, >30 days before procedure Letter complete? No, >30 days before procedure Scheduled with cath lab? Yes Labs ordered (CBC, BMET, PT/INR if on warfarin)? Yes Dye allergy? No Pre-meds ordered and instructions given? N/a Letter method: MyChart Special instructions: None H&P: 1/12  Follow-up:  Cassie/Angel, please schedule Routine.  Covering RN:  Please send this message to [employer], EP scheduler, EP Scheduling pool, and EP [employer].

## 2024-06-14 ENCOUNTER — Encounter (HOSPITAL_COMMUNITY): Payer: Self-pay

## 2024-06-14 ENCOUNTER — Other Ambulatory Visit: Payer: Self-pay | Admitting: Internal Medicine

## 2024-06-14 ENCOUNTER — Telehealth (HOSPITAL_COMMUNITY): Payer: Self-pay

## 2024-06-14 ENCOUNTER — Other Ambulatory Visit: Payer: Self-pay

## 2024-06-14 NOTE — Telephone Encounter (Signed)
 Spoke with patient to discuss upcoming procedure.   Confirmed patient is scheduled for a Permanent transvenous pacemaker (PPM) on Monday, February 16 with Dr. Dr. Nancey. Instructed patient to arrive at the Main Entrance A at [facility]: [address] [place], [place] [address] and check in at Admitting at 11:30 AM.   Labs to be completed  Any recent signs of acute illness or been started on antibiotics? No  Any new medications started? No Any medications to hold? Yes;  HOLD Tirzepatide  (Mounjaro , Zepbound ) for 7 days prior to your procedure. Last dose on February 8.  HOLD: Xarelto  (Rivaroxaban ) for 1 day prior to your procedure. Your last dose will be Saturday, February 14. Medication instructions:  On the morning of your procedure take all other prescribed medications with sips of water.  You may have a LIGHT breakfast prior to 7:00 am on the morning of your procedure. Nothing to eat or drink after 7:00 am except for sips of water with your medications.   The night before your procedure and the morning of your procedure, wash thoroughly with the CHG surgical soap from the neck down, paying special attention to the area where your procedure will be performed.  Plan to go home the same day, you will only stay overnight if medically necessary. You MUST have a responsible adult to drive you home and MUST be with you the first 24 hours after you arrive home.  Informed patient a nurse will call a day before the procedure to confirm arrival time and ensure instructions are followed.  Patient verbalized understanding to all instructions provided and agreed to proceed with procedure.

## 2024-06-17 LAB — BASIC METABOLIC PANEL WITH GFR
BUN/Creatinine Ratio: 8 — AB (ref 10–24)
BUN: 11 mg/dL (ref 8–27)
CO2: 20 mmol/L (ref 20–29)
Calcium: 9.3 mg/dL (ref 8.6–10.2)
Chloride: 105 mmol/L (ref 96–106)
Creatinine, Ser: 1.3 mg/dL — AB (ref 0.76–1.27)
Glucose: 116 mg/dL — AB (ref 70–99)
Potassium: 4 mmol/L (ref 3.5–5.2)
Sodium: 140 mmol/L (ref 134–144)
eGFR: 55 mL/min/1.73 — AB

## 2024-06-17 LAB — CBC
Hematocrit: 36 % — ABNORMAL LOW (ref 37.5–51.0)
Hemoglobin: 12 g/dL — ABNORMAL LOW (ref 13.0–17.7)
MCH: 31.6 pg (ref 26.6–33.0)
MCHC: 33.3 g/dL (ref 31.5–35.7)
MCV: 95 fL (ref 79–97)
Platelets: 185 x10E3/uL (ref 150–450)
RBC: 3.8 x10E6/uL — ABNORMAL LOW (ref 4.14–5.80)
RDW: 15.5 % — ABNORMAL HIGH (ref 11.6–15.4)
WBC: 9.5 x10E3/uL (ref 3.4–10.8)

## 2024-06-19 ENCOUNTER — Ambulatory Visit: Payer: Self-pay | Admitting: Cardiovascular Disease

## 2024-06-21 ENCOUNTER — Other Ambulatory Visit: Payer: Self-pay | Admitting: Internal Medicine

## 2024-06-24 ENCOUNTER — Encounter: Payer: Self-pay | Admitting: Internal Medicine

## 2024-06-25 ENCOUNTER — Other Ambulatory Visit: Payer: Self-pay

## 2024-06-25 MED ORDER — ALLOPURINOL 100 MG PO TABS: 100.0000 mg | ORAL_TABLET | Freq: Every day | ORAL | 1 refills | Status: AC

## 2024-06-28 ENCOUNTER — Encounter (INDEPENDENT_AMBULATORY_CARE_PROVIDER_SITE_OTHER): Payer: Self-pay

## 2024-06-28 ENCOUNTER — Encounter (INDEPENDENT_AMBULATORY_CARE_PROVIDER_SITE_OTHER)

## 2024-06-28 ENCOUNTER — Ambulatory Visit (INDEPENDENT_AMBULATORY_CARE_PROVIDER_SITE_OTHER): Admitting: Vascular Surgery

## 2024-07-09 ENCOUNTER — Ambulatory Visit (HOSPITAL_COMMUNITY)

## 2024-07-12 ENCOUNTER — Ambulatory Visit (HOSPITAL_COMMUNITY): Admit: 2024-07-12 | Admitting: Cardiovascular Disease

## 2024-07-12 ENCOUNTER — Encounter (HOSPITAL_COMMUNITY): Payer: Self-pay

## 2024-07-12 SURGERY — PACEMAKER IMPLANT

## 2024-07-20 ENCOUNTER — Encounter: Admitting: Gastroenterology

## 2024-08-02 ENCOUNTER — Ambulatory Visit (INDEPENDENT_AMBULATORY_CARE_PROVIDER_SITE_OTHER): Admitting: Vascular Surgery

## 2024-08-02 ENCOUNTER — Encounter (INDEPENDENT_AMBULATORY_CARE_PROVIDER_SITE_OTHER)

## 2025-01-20 ENCOUNTER — Ambulatory Visit

## 2025-01-21 ENCOUNTER — Ambulatory Visit
